# Patient Record
Sex: Male | Born: 1950
Health system: Southern US, Community
[De-identification: ages and names within clinical notes are randomized; demographics above are authoritative.]

## PROBLEM LIST (undated history)

## (undated) DIAGNOSIS — I10 Essential (primary) hypertension: Secondary | ICD-10-CM

## (undated) DIAGNOSIS — E785 Hyperlipidemia, unspecified: Secondary | ICD-10-CM

## (undated) DIAGNOSIS — R972 Elevated prostate specific antigen [PSA]: Secondary | ICD-10-CM

## (undated) DIAGNOSIS — I712 Thoracic aortic aneurysm, without rupture, unspecified: Secondary | ICD-10-CM

## (undated) DIAGNOSIS — T7840XA Allergy, unspecified, initial encounter: Secondary | ICD-10-CM

## (undated) DIAGNOSIS — K5909 Other constipation: Secondary | ICD-10-CM

## (undated) DIAGNOSIS — E0590 Thyrotoxicosis, unspecified without thyrotoxic crisis or storm: Secondary | ICD-10-CM

## (undated) DIAGNOSIS — H269 Unspecified cataract: Secondary | ICD-10-CM

## (undated) DIAGNOSIS — E079 Disorder of thyroid, unspecified: Secondary | ICD-10-CM

## (undated) DIAGNOSIS — G4730 Sleep apnea, unspecified: Secondary | ICD-10-CM

## (undated) DIAGNOSIS — E669 Obesity, unspecified: Secondary | ICD-10-CM

## (undated) DIAGNOSIS — K219 Gastro-esophageal reflux disease without esophagitis: Secondary | ICD-10-CM

## (undated) DIAGNOSIS — M109 Gout, unspecified: Secondary | ICD-10-CM

## (undated) HISTORY — PX: HYDROCELE EXCISION / REPAIR: SUR1145

## (undated) HISTORY — DX: Gout, unspecified: M10.9

## (undated) HISTORY — DX: Sleep apnea, unspecified: G47.30

## (undated) HISTORY — PX: VASECTOMY: SHX75

## (undated) HISTORY — PX: APPENDECTOMY: SHX54

## (undated) HISTORY — PX: EYE SURGERY: SHX253

## (undated) HISTORY — PX: TONSILLECTOMY: SUR1361

## (undated) HISTORY — DX: Thyrotoxicosis, unspecified without thyrotoxic crisis or storm: E05.90

## (undated) HISTORY — PX: THYROID SURGERY: SHX805

## (undated) HISTORY — DX: Allergy, unspecified, initial encounter: T78.40XA

## (undated) HISTORY — DX: Other constipation: K59.09

## (undated) HISTORY — DX: Obesity, unspecified: E66.9

## (undated) HISTORY — PX: COLONOSCOPY: SHX174

## (undated) HISTORY — DX: Hyperlipidemia, unspecified: E78.5

## (undated) HISTORY — DX: Thoracic aortic aneurysm, without rupture, unspecified: I71.20

## (undated) HISTORY — DX: Unspecified cataract: H26.9

## (undated) HISTORY — DX: Elevated prostate specific antigen (PSA): R97.20

## (undated) HISTORY — PX: CATARACT EXTRACTION, BILATERAL: SHX1313

## (undated) HISTORY — DX: Disorder of thyroid, unspecified: E07.9

## (undated) HISTORY — DX: Gastro-esophageal reflux disease without esophagitis: K21.9

## (undated) HISTORY — DX: Essential (primary) hypertension: I10

## (undated) HISTORY — PX: FRACTURE SURGERY: SHX138

---

## 2014-12-23 DIAGNOSIS — K429 Umbilical hernia without obstruction or gangrene: Secondary | ICD-10-CM | POA: Insufficient documentation

## 2016-05-17 DIAGNOSIS — Z6835 Body mass index (BMI) 35.0-35.9, adult: Secondary | ICD-10-CM | POA: Insufficient documentation

## 2016-05-17 DIAGNOSIS — I10 Essential (primary) hypertension: Secondary | ICD-10-CM | POA: Insufficient documentation

## 2016-05-17 DIAGNOSIS — Z6841 Body Mass Index (BMI) 40.0 and over, adult: Secondary | ICD-10-CM | POA: Insufficient documentation

## 2016-08-17 DIAGNOSIS — H35433 Paving stone degeneration of retina, bilateral: Secondary | ICD-10-CM | POA: Diagnosis not present

## 2016-08-17 DIAGNOSIS — H353131 Nonexudative age-related macular degeneration, bilateral, early dry stage: Secondary | ICD-10-CM | POA: Diagnosis not present

## 2016-08-17 DIAGNOSIS — D3132 Benign neoplasm of left choroid: Secondary | ICD-10-CM | POA: Diagnosis not present

## 2016-08-17 DIAGNOSIS — H43811 Vitreous degeneration, right eye: Secondary | ICD-10-CM | POA: Diagnosis not present

## 2016-12-12 DIAGNOSIS — R739 Hyperglycemia, unspecified: Secondary | ICD-10-CM | POA: Diagnosis not present

## 2016-12-12 DIAGNOSIS — E785 Hyperlipidemia, unspecified: Secondary | ICD-10-CM | POA: Diagnosis not present

## 2016-12-12 DIAGNOSIS — Z79899 Other long term (current) drug therapy: Secondary | ICD-10-CM | POA: Diagnosis not present

## 2016-12-12 DIAGNOSIS — Z6835 Body mass index (BMI) 35.0-35.9, adult: Secondary | ICD-10-CM | POA: Diagnosis not present

## 2016-12-12 DIAGNOSIS — M25561 Pain in right knee: Secondary | ICD-10-CM | POA: Diagnosis not present

## 2016-12-12 DIAGNOSIS — M109 Gout, unspecified: Secondary | ICD-10-CM | POA: Diagnosis not present

## 2016-12-12 DIAGNOSIS — E039 Hypothyroidism, unspecified: Secondary | ICD-10-CM | POA: Diagnosis not present

## 2016-12-12 DIAGNOSIS — I10 Essential (primary) hypertension: Secondary | ICD-10-CM | POA: Diagnosis not present

## 2017-01-19 DIAGNOSIS — R739 Hyperglycemia, unspecified: Secondary | ICD-10-CM | POA: Diagnosis not present

## 2017-01-19 DIAGNOSIS — E785 Hyperlipidemia, unspecified: Secondary | ICD-10-CM | POA: Diagnosis not present

## 2017-01-19 DIAGNOSIS — Z79899 Other long term (current) drug therapy: Secondary | ICD-10-CM | POA: Diagnosis not present

## 2017-01-19 DIAGNOSIS — I10 Essential (primary) hypertension: Secondary | ICD-10-CM | POA: Diagnosis not present

## 2017-01-19 DIAGNOSIS — M109 Gout, unspecified: Secondary | ICD-10-CM | POA: Diagnosis not present

## 2017-01-19 DIAGNOSIS — E039 Hypothyroidism, unspecified: Secondary | ICD-10-CM | POA: Diagnosis not present

## 2017-03-07 DIAGNOSIS — E785 Hyperlipidemia, unspecified: Secondary | ICD-10-CM | POA: Insufficient documentation

## 2017-03-07 DIAGNOSIS — K5909 Other constipation: Secondary | ICD-10-CM | POA: Insufficient documentation

## 2017-03-07 DIAGNOSIS — R635 Abnormal weight gain: Secondary | ICD-10-CM | POA: Diagnosis not present

## 2017-03-07 DIAGNOSIS — Z8739 Personal history of other diseases of the musculoskeletal system and connective tissue: Secondary | ICD-10-CM | POA: Diagnosis not present

## 2017-03-07 DIAGNOSIS — E039 Hypothyroidism, unspecified: Secondary | ICD-10-CM | POA: Insufficient documentation

## 2017-03-07 DIAGNOSIS — G4733 Obstructive sleep apnea (adult) (pediatric): Secondary | ICD-10-CM | POA: Insufficient documentation

## 2017-03-07 DIAGNOSIS — R948 Abnormal results of function studies of other organs and systems: Secondary | ICD-10-CM | POA: Diagnosis not present

## 2017-03-07 DIAGNOSIS — E669 Obesity, unspecified: Secondary | ICD-10-CM | POA: Diagnosis not present

## 2017-03-07 DIAGNOSIS — Z6838 Body mass index (BMI) 38.0-38.9, adult: Secondary | ICD-10-CM | POA: Diagnosis not present

## 2017-03-07 DIAGNOSIS — I10 Essential (primary) hypertension: Secondary | ICD-10-CM | POA: Diagnosis not present

## 2017-03-21 DIAGNOSIS — E785 Hyperlipidemia, unspecified: Secondary | ICD-10-CM | POA: Diagnosis not present

## 2017-03-21 DIAGNOSIS — Z8739 Personal history of other diseases of the musculoskeletal system and connective tissue: Secondary | ICD-10-CM | POA: Diagnosis not present

## 2017-03-21 DIAGNOSIS — I10 Essential (primary) hypertension: Secondary | ICD-10-CM | POA: Diagnosis not present

## 2017-03-21 DIAGNOSIS — E669 Obesity, unspecified: Secondary | ICD-10-CM | POA: Diagnosis not present

## 2017-03-21 DIAGNOSIS — Z6837 Body mass index (BMI) 37.0-37.9, adult: Secondary | ICD-10-CM | POA: Diagnosis not present

## 2017-03-30 DIAGNOSIS — Z713 Dietary counseling and surveillance: Secondary | ICD-10-CM | POA: Diagnosis not present

## 2017-03-30 DIAGNOSIS — E669 Obesity, unspecified: Secondary | ICD-10-CM | POA: Diagnosis not present

## 2017-03-30 DIAGNOSIS — Z6836 Body mass index (BMI) 36.0-36.9, adult: Secondary | ICD-10-CM | POA: Diagnosis not present

## 2017-04-17 DIAGNOSIS — Z713 Dietary counseling and surveillance: Secondary | ICD-10-CM | POA: Diagnosis not present

## 2017-04-17 DIAGNOSIS — Z6836 Body mass index (BMI) 36.0-36.9, adult: Secondary | ICD-10-CM | POA: Diagnosis not present

## 2017-04-17 DIAGNOSIS — E669 Obesity, unspecified: Secondary | ICD-10-CM | POA: Diagnosis not present

## 2017-05-02 DIAGNOSIS — Z6836 Body mass index (BMI) 36.0-36.9, adult: Secondary | ICD-10-CM | POA: Diagnosis not present

## 2017-05-02 DIAGNOSIS — E785 Hyperlipidemia, unspecified: Secondary | ICD-10-CM | POA: Diagnosis not present

## 2017-05-02 DIAGNOSIS — I10 Essential (primary) hypertension: Secondary | ICD-10-CM | POA: Diagnosis not present

## 2017-05-02 DIAGNOSIS — G4733 Obstructive sleep apnea (adult) (pediatric): Secondary | ICD-10-CM | POA: Diagnosis not present

## 2017-05-08 DIAGNOSIS — L282 Other prurigo: Secondary | ICD-10-CM | POA: Diagnosis not present

## 2017-05-08 DIAGNOSIS — Z6835 Body mass index (BMI) 35.0-35.9, adult: Secondary | ICD-10-CM | POA: Diagnosis not present

## 2017-05-08 DIAGNOSIS — I10 Essential (primary) hypertension: Secondary | ICD-10-CM | POA: Diagnosis not present

## 2017-05-08 DIAGNOSIS — Z125 Encounter for screening for malignant neoplasm of prostate: Secondary | ICD-10-CM | POA: Diagnosis not present

## 2017-05-08 DIAGNOSIS — E785 Hyperlipidemia, unspecified: Secondary | ICD-10-CM | POA: Diagnosis not present

## 2017-05-08 DIAGNOSIS — R42 Dizziness and giddiness: Secondary | ICD-10-CM | POA: Diagnosis not present

## 2017-05-08 DIAGNOSIS — E039 Hypothyroidism, unspecified: Secondary | ICD-10-CM | POA: Diagnosis not present

## 2017-05-08 DIAGNOSIS — Z79899 Other long term (current) drug therapy: Secondary | ICD-10-CM | POA: Diagnosis not present

## 2017-05-25 DIAGNOSIS — I10 Essential (primary) hypertension: Secondary | ICD-10-CM | POA: Diagnosis not present

## 2017-05-25 DIAGNOSIS — Z125 Encounter for screening for malignant neoplasm of prostate: Secondary | ICD-10-CM | POA: Diagnosis not present

## 2017-05-25 DIAGNOSIS — E039 Hypothyroidism, unspecified: Secondary | ICD-10-CM | POA: Diagnosis not present

## 2017-05-25 DIAGNOSIS — R42 Dizziness and giddiness: Secondary | ICD-10-CM | POA: Diagnosis not present

## 2017-05-25 DIAGNOSIS — Z79899 Other long term (current) drug therapy: Secondary | ICD-10-CM | POA: Diagnosis not present

## 2017-05-25 DIAGNOSIS — Z6835 Body mass index (BMI) 35.0-35.9, adult: Secondary | ICD-10-CM | POA: Diagnosis not present

## 2017-05-25 DIAGNOSIS — E785 Hyperlipidemia, unspecified: Secondary | ICD-10-CM | POA: Diagnosis not present

## 2017-05-31 DIAGNOSIS — I10 Essential (primary) hypertension: Secondary | ICD-10-CM | POA: Diagnosis not present

## 2017-05-31 DIAGNOSIS — Z0000 Encounter for general adult medical examination without abnormal findings: Secondary | ICD-10-CM | POA: Diagnosis not present

## 2017-05-31 DIAGNOSIS — Z23 Encounter for immunization: Secondary | ICD-10-CM | POA: Diagnosis not present

## 2017-06-01 DIAGNOSIS — M9903 Segmental and somatic dysfunction of lumbar region: Secondary | ICD-10-CM | POA: Diagnosis not present

## 2017-06-01 DIAGNOSIS — M5134 Other intervertebral disc degeneration, thoracic region: Secondary | ICD-10-CM | POA: Diagnosis not present

## 2017-06-01 DIAGNOSIS — M5137 Other intervertebral disc degeneration, lumbosacral region: Secondary | ICD-10-CM | POA: Diagnosis not present

## 2017-06-01 DIAGNOSIS — M5136 Other intervertebral disc degeneration, lumbar region: Secondary | ICD-10-CM | POA: Diagnosis not present

## 2017-06-01 DIAGNOSIS — M9902 Segmental and somatic dysfunction of thoracic region: Secondary | ICD-10-CM | POA: Diagnosis not present

## 2017-06-01 DIAGNOSIS — M5135 Other intervertebral disc degeneration, thoracolumbar region: Secondary | ICD-10-CM | POA: Diagnosis not present

## 2017-06-02 DIAGNOSIS — M9903 Segmental and somatic dysfunction of lumbar region: Secondary | ICD-10-CM | POA: Diagnosis not present

## 2017-06-02 DIAGNOSIS — M5135 Other intervertebral disc degeneration, thoracolumbar region: Secondary | ICD-10-CM | POA: Diagnosis not present

## 2017-06-02 DIAGNOSIS — M9902 Segmental and somatic dysfunction of thoracic region: Secondary | ICD-10-CM | POA: Diagnosis not present

## 2017-06-02 DIAGNOSIS — M5136 Other intervertebral disc degeneration, lumbar region: Secondary | ICD-10-CM | POA: Diagnosis not present

## 2017-06-02 DIAGNOSIS — M5134 Other intervertebral disc degeneration, thoracic region: Secondary | ICD-10-CM | POA: Diagnosis not present

## 2017-06-02 DIAGNOSIS — M5137 Other intervertebral disc degeneration, lumbosacral region: Secondary | ICD-10-CM | POA: Diagnosis not present

## 2017-06-05 DIAGNOSIS — M9902 Segmental and somatic dysfunction of thoracic region: Secondary | ICD-10-CM | POA: Diagnosis not present

## 2017-06-05 DIAGNOSIS — M5135 Other intervertebral disc degeneration, thoracolumbar region: Secondary | ICD-10-CM | POA: Diagnosis not present

## 2017-06-05 DIAGNOSIS — M5137 Other intervertebral disc degeneration, lumbosacral region: Secondary | ICD-10-CM | POA: Diagnosis not present

## 2017-06-05 DIAGNOSIS — M9903 Segmental and somatic dysfunction of lumbar region: Secondary | ICD-10-CM | POA: Diagnosis not present

## 2017-06-05 DIAGNOSIS — M5136 Other intervertebral disc degeneration, lumbar region: Secondary | ICD-10-CM | POA: Diagnosis not present

## 2017-06-05 DIAGNOSIS — M5134 Other intervertebral disc degeneration, thoracic region: Secondary | ICD-10-CM | POA: Diagnosis not present

## 2017-06-07 DIAGNOSIS — M5135 Other intervertebral disc degeneration, thoracolumbar region: Secondary | ICD-10-CM | POA: Diagnosis not present

## 2017-06-07 DIAGNOSIS — M5134 Other intervertebral disc degeneration, thoracic region: Secondary | ICD-10-CM | POA: Diagnosis not present

## 2017-06-07 DIAGNOSIS — M9902 Segmental and somatic dysfunction of thoracic region: Secondary | ICD-10-CM | POA: Diagnosis not present

## 2017-06-07 DIAGNOSIS — M5137 Other intervertebral disc degeneration, lumbosacral region: Secondary | ICD-10-CM | POA: Diagnosis not present

## 2017-06-07 DIAGNOSIS — M9903 Segmental and somatic dysfunction of lumbar region: Secondary | ICD-10-CM | POA: Diagnosis not present

## 2017-06-07 DIAGNOSIS — M5136 Other intervertebral disc degeneration, lumbar region: Secondary | ICD-10-CM | POA: Diagnosis not present

## 2017-06-13 DIAGNOSIS — Z6835 Body mass index (BMI) 35.0-35.9, adult: Secondary | ICD-10-CM | POA: Diagnosis not present

## 2017-06-13 DIAGNOSIS — E669 Obesity, unspecified: Secondary | ICD-10-CM | POA: Diagnosis not present

## 2017-06-13 DIAGNOSIS — Z713 Dietary counseling and surveillance: Secondary | ICD-10-CM | POA: Diagnosis not present

## 2017-06-27 DIAGNOSIS — G4733 Obstructive sleep apnea (adult) (pediatric): Secondary | ICD-10-CM | POA: Diagnosis not present

## 2017-06-27 DIAGNOSIS — E669 Obesity, unspecified: Secondary | ICD-10-CM | POA: Diagnosis not present

## 2017-06-27 DIAGNOSIS — Z6835 Body mass index (BMI) 35.0-35.9, adult: Secondary | ICD-10-CM | POA: Diagnosis not present

## 2017-06-27 DIAGNOSIS — E785 Hyperlipidemia, unspecified: Secondary | ICD-10-CM | POA: Diagnosis not present

## 2017-08-21 DIAGNOSIS — Z6836 Body mass index (BMI) 36.0-36.9, adult: Secondary | ICD-10-CM | POA: Diagnosis not present

## 2017-08-21 DIAGNOSIS — E039 Hypothyroidism, unspecified: Secondary | ICD-10-CM | POA: Diagnosis not present

## 2017-08-21 DIAGNOSIS — I10 Essential (primary) hypertension: Secondary | ICD-10-CM | POA: Diagnosis not present

## 2017-08-21 DIAGNOSIS — E785 Hyperlipidemia, unspecified: Secondary | ICD-10-CM | POA: Diagnosis not present

## 2017-08-21 DIAGNOSIS — G4733 Obstructive sleep apnea (adult) (pediatric): Secondary | ICD-10-CM | POA: Diagnosis not present

## 2017-08-21 DIAGNOSIS — E669 Obesity, unspecified: Secondary | ICD-10-CM | POA: Diagnosis not present

## 2017-10-30 DIAGNOSIS — E785 Hyperlipidemia, unspecified: Secondary | ICD-10-CM | POA: Diagnosis not present

## 2017-10-30 DIAGNOSIS — Z6836 Body mass index (BMI) 36.0-36.9, adult: Secondary | ICD-10-CM | POA: Diagnosis not present

## 2017-10-30 DIAGNOSIS — E669 Obesity, unspecified: Secondary | ICD-10-CM | POA: Diagnosis not present

## 2017-10-30 DIAGNOSIS — E038 Other specified hypothyroidism: Secondary | ICD-10-CM | POA: Diagnosis not present

## 2017-10-30 DIAGNOSIS — I10 Essential (primary) hypertension: Secondary | ICD-10-CM | POA: Diagnosis not present

## 2017-10-30 DIAGNOSIS — G4733 Obstructive sleep apnea (adult) (pediatric): Secondary | ICD-10-CM | POA: Diagnosis not present

## 2018-01-18 ENCOUNTER — Ambulatory Visit (INDEPENDENT_AMBULATORY_CARE_PROVIDER_SITE_OTHER): Payer: Medicare Other | Admitting: Medical

## 2018-01-18 ENCOUNTER — Encounter: Payer: Self-pay | Admitting: Medical

## 2018-01-18 VITALS — BP 139/90 | HR 82 | Temp 98.0°F | Resp 16 | Ht 73.0 in | Wt 279.0 lb

## 2018-01-18 DIAGNOSIS — M109 Gout, unspecified: Secondary | ICD-10-CM

## 2018-01-18 DIAGNOSIS — E785 Hyperlipidemia, unspecified: Secondary | ICD-10-CM

## 2018-01-18 DIAGNOSIS — I10 Essential (primary) hypertension: Secondary | ICD-10-CM | POA: Diagnosis not present

## 2018-01-18 DIAGNOSIS — E039 Hypothyroidism, unspecified: Secondary | ICD-10-CM | POA: Diagnosis not present

## 2018-01-18 DIAGNOSIS — R972 Elevated prostate specific antigen [PSA]: Secondary | ICD-10-CM | POA: Insufficient documentation

## 2018-01-18 MED ORDER — LOSARTAN POTASSIUM 25 MG PO TABS: 25.0000 mg | ORAL_TABLET | Freq: Every day | ORAL | 1 refills | Status: DC

## 2018-01-18 MED ORDER — SIMVASTATIN 20 MG PO TABS: 20.0000 mg | ORAL_TABLET | Freq: Every day | ORAL | 0 refills | Status: DC

## 2018-01-18 NOTE — Patient Instructions (Addendum)
  Your blood pressure is moderately well controlled today.  Blood pressures are better at home and when you lost weight recently.  We will continue current BP med regimen and refill prescription today.  For your high cholesterol, also refilled prescription.  I am putting in future labs to be done within the next month.  You could schedule those as you leave today or you could call back.  Lab does want you to be prescheduled a day or 2 in advance   Recommend healthy diet and exercise as tolerated.   If your right knee pain worsens or changes please let me know and could order x-ray.  Follow-up date to be determined after lab review.

## 2018-01-18 NOTE — Progress Notes (Signed)
Subjective:    Patient ID: Jackson French, male    DOB: [birth year], 67 y.o.   MRN: [id number]  HPI   Pt in for first time.   He has family in [place]. 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 He worked in [employer] and [place] [place]. Was in [employer] for 6 years before he went into private practice.  He feels good today. No acute complaints.  He has hx of gout, htn and high cholesterol.  He get rare gout flare. He has colchicine for acute attacks. He states rare flare. Last flare rt knee one year ago.  Pt bp is 139/90 presently. At home his bp is 135/80 usually approximate.  Pt has hx of high cholesterol. He is on zocor.   Also has hypothyroid. On thyroid supplmentation  Recent weight max was 290 lb.   No acute symptoms presently.   Review of Systems  Constitutional: Negative for chills, fatigue and fever.  HENT: Negative for congestion, dental problem, ear discharge and ear pain.   Respiratory: Negative for cough, chest tightness, shortness of breath and wheezing.   Cardiovascular: Negative for chest pain and palpitations.  Gastrointestinal: Negative for abdominal pain.  Musculoskeletal: Negative for back pain, gait problem, neck pain and neck stiffness.       Occasional rt knee pain.  Skin: Negative for rash.  Neurological: Negative for dizziness, speech difficulty, weakness, light-headedness, numbness and headaches.  Hematological: Negative for adenopathy. Does not bruise/bleed easily.  Psychiatric/Behavioral: Negative for behavioral problems, confusion, self-injury and sleep disturbance. The patient is not nervous/anxious.     Past Medical History:  Diagnosis Date  . Hyperlipidemia   . Hypertension      Social History   Socioeconomic History  . Marital status: Married    Spouse name: Not on file  . Number of children: Not on file  . Years of education: Not on file  . Highest education level: Not on file  Occupational History  . Not on file    Social Needs  . Financial resource strain: Not on file  . Food insecurity:    Worry: Not on file    Inability: Not on file  . Transportation needs:    Medical: Not on file    Non-medical: Not on file  Tobacco Use  . Smoking status: Never Smoker  . Smokeless tobacco: Never Used  Substance and Sexual Activity  . Alcohol use: Yes  . Drug use: Not on file  . Sexual activity: Not on file  Lifestyle  . Physical activity:    Days per week: Not on file    Minutes per session: Not on file  . Stress: Not on file  Relationships  . Social connections:    Talks on phone: Not on file    Gets together: Not on file    Attends religious service: Not on file    Active member of club or organization: Not on file    Attends meetings of clubs or organizations: Not on file    Relationship status: Not on file  . Intimate partner violence:    Fear of current or ex partner: Not on file    Emotionally abused: Not on file    Physically abused: Not on file    Forced sexual activity: Not on file  Other Topics Concern  . Not on file  Social History Narrative  . Not on file      Family History  Problem Relation Age of Onset  .  Arthritis Mother   . Breast cancer Mother     Not on File  Current Outpatient Medications on File Prior to Visit  Medication Sig Dispense Refill  . aspirin 81 MG EC tablet Take 20 mg by mouth daily.    . colchicine 0.6 MG tablet Take 0.6 mg by mouth daily as needed.    Marland Kitchen levothyroxine (SYNTHROID, LEVOTHROID) 112 MCG tablet Take 112 mcg by mouth daily.    Marland Kitchen losartan (COZAAR) 25 MG tablet Take 25 mg by mouth daily.    . simvastatin (ZOCOR) 20 MG tablet Take 20 mg by mouth daily.     No current facility-administered medications on file prior to visit.     BP 139/90   Pulse 82   Temp 98 F (36.7 C) (Oral)   Resp 16   Ht 6\' 1"  (1.854 m)   Wt 279 lb (126.6 kg)   SpO2 98%   BMI 36.81 kg/m        Objective:   Physical Exam   General Mental Status-  Alert. General Appearance- Not in acute distress.   Skin General: Color- Normal Color. Moisture- Normal Moisture.  Neck Carotid Arteries- Normal color. Moisture- Normal Moisture. No carotid bruits. No JVD.  Chest and Lung Exam Auscultation: Breath Sounds:-Normal.  Cardiovascular Auscultation:Rythm- Regular. Murmurs & Other Heart Sounds:Auscultation of the heart reveals- No Murmurs.  Abdomen Inspection:-Inspeection Normal. Palpation/Percussion:Note:No mass. Palpation and Percussion of the abdomen reveal- Non Tender, Non Distended + BS, no rebound or guarding.  Neurologic Cranial Nerve exam:- CN III-XII intact(No nystagmus), symmetric smile. Strength:- 5/5 equal and symmetric strength both upper and lower extremities.     Assessment & Plan:  Your blood pressure is moderately well controlled today.  Blood pressures are better at home and when he lost weight recently.  We will continue current BP med regimen and refill prescription today.  For your high cholesterol, also refill prescription.  I am putting in future labs to be done within the next month.  You could schedule those as you leave today or you could call back.  Lab does want you to be prescheduled a day or 2 in advance.   Recommend healthy diet and exercise as tolerated.   If your right knee pain worsens or changes please let me know and could order x-ray.  Follow-up date to be determined after lab review.  Mackie Pai, PA-C

## 2018-02-12 ENCOUNTER — Other Ambulatory Visit: Payer: Self-pay | Admitting: Medical

## 2018-02-15 ENCOUNTER — Other Ambulatory Visit (INDEPENDENT_AMBULATORY_CARE_PROVIDER_SITE_OTHER): Payer: Medicare Other

## 2018-02-15 DIAGNOSIS — I10 Essential (primary) hypertension: Secondary | ICD-10-CM | POA: Diagnosis not present

## 2018-02-15 DIAGNOSIS — M109 Gout, unspecified: Secondary | ICD-10-CM | POA: Diagnosis not present

## 2018-02-15 DIAGNOSIS — E039 Hypothyroidism, unspecified: Secondary | ICD-10-CM

## 2018-02-15 DIAGNOSIS — E785 Hyperlipidemia, unspecified: Secondary | ICD-10-CM

## 2018-02-15 LAB — COMPREHENSIVE METABOLIC PANEL
ALT: 20 U/L (ref 0–53)
AST: 18 U/L (ref 0–37)
Albumin: 4.2 g/dL (ref 3.5–5.2)
Alkaline Phosphatase: 59 U/L (ref 39–117)
BUN: 17 mg/dL (ref 6–23)
CO2: 25 mEq/L (ref 19–32)
Calcium: 9.1 mg/dL (ref 8.4–10.5)
Chloride: 107 mEq/L (ref 96–112)
Creatinine, Ser: 0.92 mg/dL (ref 0.40–1.50)
GFR: 87.14 mL/min (ref >60.00)
Glucose, Bld: 116 mg/dL — ABNORMAL HIGH (ref 70–99)
Potassium: 4 mEq/L (ref 3.5–5.1)
Sodium: 140 mEq/L (ref 135–145)
Total Bilirubin: 0.6 mg/dL (ref 0.2–1.2)
Total Protein: 6.2 g/dL (ref 6.0–8.3)

## 2018-02-15 LAB — CBC WITH DIFFERENTIAL/PLATELET
BASOS ABS: 0 10*3/uL (ref 0.0–0.1)
Basophils Relative: 0.7 % (ref 0.0–3.0)
EOS PCT: 8.3 % — AB (ref 0.0–5.0)
Eosinophils Absolute: 0.4 10*3/uL (ref 0.0–0.7)
HEMATOCRIT: 43.6 % (ref 39.0–52.0)
Hemoglobin: 15.1 g/dL (ref 13.0–17.0)
LYMPHS ABS: 1.6 10*3/uL (ref 0.7–4.0)
LYMPHS PCT: 29.4 % (ref 12.0–46.0)
MCHC: 34.7 g/dL (ref 30.0–36.0)
MCV: 91.1 fl (ref 78.0–100.0)
MONOS PCT: 10.2 % (ref 3.0–12.0)
Monocytes Absolute: 0.5 10*3/uL (ref 0.1–1.0)
Neutro Abs: 2.7 10*3/uL (ref 1.4–7.7)
Neutrophils Relative %: 51.4 % (ref 43.0–77.0)
Platelets: 224 10*3/uL (ref 150.0–400.0)
RBC: 4.79 Mil/uL (ref 4.22–5.81)
RDW: 12.9 % (ref 11.5–15.5)
WBC: 5.3 10*3/uL (ref 4.0–10.5)

## 2018-02-15 LAB — TSH: TSH: 3.45 u[IU]/mL (ref 0.35–4.50)

## 2018-02-15 LAB — LIPID PANEL
Cholesterol: 132 mg/dL (ref 0–200)
HDL: 34.9 mg/dL — ABNORMAL LOW (ref >39.00)
LDL Cholesterol: 74 mg/dL (ref 0–99)
NonHDL: 97.05
Total CHOL/HDL Ratio: 4
Triglycerides: 115 mg/dL (ref 0.0–149.0)
VLDL: 23 mg/dL (ref 0.0–40.0)

## 2018-02-15 LAB — URIC ACID: Uric Acid, Serum: 8 mg/dL — ABNORMAL HIGH (ref 4.0–7.8)

## 2018-02-19 ENCOUNTER — Telehealth: Payer: Self-pay | Admitting: *Deleted

## 2018-02-19 ENCOUNTER — Other Ambulatory Visit (INDEPENDENT_AMBULATORY_CARE_PROVIDER_SITE_OTHER): Payer: Medicare Other

## 2018-02-19 DIAGNOSIS — R739 Hyperglycemia, unspecified: Secondary | ICD-10-CM | POA: Diagnosis not present

## 2018-02-19 LAB — HEMOGLOBIN A1C: HEMOGLOBIN A1C: 5.5 % (ref 4.6–6.5)

## 2018-02-19 NOTE — Telephone Encounter (Signed)
Add-on for HgbA1c was faxed on (02/16/18) and (02/19/18).  Confirmation was received.//AB/CMA

## 2018-02-19 NOTE — Telephone Encounter (Signed)
-----   Message from Mackie Pai, PA-C sent at 02/15/2018  9:55 PM EDT ----- Can a1c be added to labs drawn today. Dx would be elevated sugar.  Thanks,

## 2018-03-13 ENCOUNTER — Other Ambulatory Visit: Payer: Self-pay | Admitting: Medical

## 2018-03-18 ENCOUNTER — Other Ambulatory Visit: Payer: Self-pay | Admitting: Medical

## 2018-05-13 ENCOUNTER — Other Ambulatory Visit: Payer: Self-pay | Admitting: Medical

## 2018-05-16 ENCOUNTER — Encounter: Payer: Self-pay | Admitting: Medical

## 2018-05-16 ENCOUNTER — Ambulatory Visit (INDEPENDENT_AMBULATORY_CARE_PROVIDER_SITE_OTHER): Payer: Medicare Other | Admitting: Medical

## 2018-05-16 VITALS — BP 133/87 | HR 61 | Temp 98.0°F | Resp 16 | Ht 73.0 in | Wt 280.6 lb

## 2018-05-16 DIAGNOSIS — E785 Hyperlipidemia, unspecified: Secondary | ICD-10-CM | POA: Diagnosis not present

## 2018-05-16 DIAGNOSIS — Z23 Encounter for immunization: Secondary | ICD-10-CM | POA: Diagnosis not present

## 2018-05-16 DIAGNOSIS — M25561 Pain in right knee: Secondary | ICD-10-CM | POA: Diagnosis not present

## 2018-05-16 DIAGNOSIS — I10 Essential (primary) hypertension: Secondary | ICD-10-CM

## 2018-05-16 MED ORDER — SIMVASTATIN 20 MG PO TABS: 20.0000 mg | ORAL_TABLET | Freq: Every day | ORAL | 3 refills | Status: DC

## 2018-05-16 NOTE — Progress Notes (Signed)
Subjective:    Patient ID: Jackson French, male    DOB: 23-Jul-1951, 67 y.o.   MRN: [id number]  HPI  Pt in for follow up.   Pt blood pressure is controlled today. He is on losartan. No side effects.   Pt lipid panel is well controlled. Only mild hdl elevation. Pt is on zocor. No side effects.  Pt states his rt knee is still hurting on and off. He states feels better if more active. Pt states no med required. Still declined referral.  Pt  a1c done last time not in diabetic range.  He wants flu vaccine today.  Hx of colonoscopy in past. They were negative per pt. Pt wants to wait until spring until done.   Pt not taking colchicine preventative manner. Only for flares and no recent flares per his report.   Review of Systems  Constitutional: Negative for chills, fatigue and fever.  Respiratory: Negative for cough, chest tightness, shortness of breath and wheezing.   Cardiovascular: Negative for chest pain and palpitations.  Gastrointestinal: Negative for abdominal distention, anal bleeding, blood in stool and diarrhea.  Genitourinary: Negative for dysuria, flank pain, frequency and testicular pain.  Musculoskeletal: Negative for back pain and myalgias.  Skin: Negative for rash.  Neurological: Negative for dizziness and headaches.  Hematological: Negative for adenopathy. Does not bruise/bleed easily.  Psychiatric/Behavioral: Negative for behavioral problems and confusion. The patient is not nervous/anxious.    Past Medical History:  Diagnosis Date  . Gout   . Hyperlipidemia   . Hypertension   . Thyroid disease      Social History   Socioeconomic History  . Marital status: Married    Spouse name: Not on file  . Number of children: Not on file  . Years of education: Not on file  . Highest education level: Not on file  Occupational History  . Not on file  Social Needs  . Financial resource strain: Not on file  . Food insecurity:    Worry: Not on file    Inability:  Not on file  . Transportation needs:    Medical: Not on file    Non-medical: Not on file  Tobacco Use  . Smoking status: Never Smoker  . Smokeless tobacco: Never Used  Substance and Sexual Activity  . Alcohol use: Yes  . Drug use: Never  . Sexual activity: Yes  Lifestyle  . Physical activity:    Days per week: Not on file    Minutes per session: Not on file  . Stress: Not on file  Relationships  . Social connections:    Talks on phone: Not on file    Gets together: Not on file    Attends religious service: Not on file    Active member of club or organization: Not on file    Attends meetings of clubs or organizations: Not on file    Relationship status: Not on file  . Intimate partner violence:    Fear of current or ex partner: Not on file    Emotionally abused: Not on file    Physically abused: Not on file    Forced sexual activity: Not on file  Other Topics Concern  . Not on file  Social History Narrative  . Not on file      Family History  Problem Relation Age of Onset  . Arthritis Mother   . Breast cancer Mother     Not on File  Current Outpatient Medications on File Prior to  Visit  Medication Sig Dispense Refill  . colchicine 0.6 MG tablet Take 0.6 mg by mouth daily as needed.    Marland Kitchen levothyroxine (SYNTHROID, LEVOTHROID) 112 MCG tablet Take 112 mcg by mouth daily.    Marland Kitchen losartan (COZAAR) 25 MG tablet Take 1 tablet (25 mg total) by mouth daily. 90 tablet 1  . simvastatin (ZOCOR) 20 MG tablet TAKE 1 TABLET BY MOUTH EVERY DAY 30 tablet 0   No current facility-administered medications on file prior to visit.     BP 133/87   Pulse 61   Temp 98 F (36.7 C) (Oral)   Resp 16   Ht 6\' 1"  (1.854 m)   Wt 280 lb 9.6 oz (127.3 kg)   SpO2 100%   BMI 37.02 kg/m       Objective:   Physical Exam  General Mental Status- Alert. General Appearance- Not in acute distress.   Skin General: Color- Normal Color. Moisture- Normal Moisture.  Neck Carotid Arteries-  Normal color. Moisture- Normal Moisture. No carotid bruits. No JVD.  Chest and Lung Exam Auscultation: Breath Sounds:-Normal.  Cardiovascular Auscultation:Rythm- Regular. Murmurs & Other Heart Sounds:Auscultation of the heart reveals- No Murmurs.  Abdomen Inspection:-Inspeection Normal. Palpation/Percussion:Note:No mass. Palpation and Percussion of the abdomen reveal- Non Tender, Non Distended + BS, no rebound or guarding.   Neurologic Cranial Nerve exam:- CN III-XII intact(No nystagmus), symmetric smile. Strength:- 5/5 equal and symmetric strength both upper and lower extremities.  Rt knee- no obvious pain on palpation presently.No crepitus.      Assessment & Plan:  Your bp is adequately controlled today. Recommend continue with losartan.  For high cholesterol, lipids well controlled in summer. Refilled today at 90 tabs.  For knee pain, if worsens severe then can get uric acid. If want xray or sport med referral let me know.  Can get you scheduled in spring for colonoscopy.(plan to place referral in spring for early summer colonoscopy. Pt wants to wait.)  Follow up April or as needed  [employer], PA-C

## 2018-05-16 NOTE — Patient Instructions (Addendum)
Your bp is adequately controlled today. Recommend continue with losartan.  For high cholesterol, lipids well controlled in summer. Refilled today at 90 tabs.  For knee pain, if worsens severe then can get uric acid. If want xray or sport med referral let me know.  Can get you scheduled in spring for colonoscopy.  Follow up April or as needed

## 2018-07-17 ENCOUNTER — Encounter: Payer: Self-pay | Admitting: Medical

## 2018-07-18 ENCOUNTER — Ambulatory Visit (INDEPENDENT_AMBULATORY_CARE_PROVIDER_SITE_OTHER): Payer: Medicare Other | Admitting: Medical

## 2018-07-18 ENCOUNTER — Encounter: Payer: Self-pay | Admitting: Medical

## 2018-07-18 VITALS — BP 136/86 | HR 69 | Temp 97.9°F | Resp 16 | Ht 73.0 in | Wt 284.8 lb

## 2018-07-18 DIAGNOSIS — R0981 Nasal congestion: Secondary | ICD-10-CM

## 2018-07-18 DIAGNOSIS — R059 Cough, unspecified: Secondary | ICD-10-CM

## 2018-07-18 DIAGNOSIS — J069 Acute upper respiratory infection, unspecified: Secondary | ICD-10-CM

## 2018-07-18 DIAGNOSIS — R350 Frequency of micturition: Secondary | ICD-10-CM | POA: Diagnosis not present

## 2018-07-18 DIAGNOSIS — R05 Cough: Secondary | ICD-10-CM

## 2018-07-18 MED ORDER — AZITHROMYCIN 250 MG PO TABS: ORAL_TABLET | ORAL | 0 refills | Status: DC

## 2018-07-18 MED ORDER — BENZONATATE 100 MG PO CAPS: 100.0000 mg | ORAL_CAPSULE | Freq: Three times a day (TID) | ORAL | 0 refills | Status: DC | PRN

## 2018-07-18 MED ORDER — FLUTICASONE PROPIONATE 50 MCG/ACT NA SUSP: 2.0000 | Freq: Every day | NASAL | 1 refills | Status: DC

## 2018-07-18 MED ORDER — LEVOTHYROXINE SODIUM 112 MCG PO TABS: 112.0000 ug | ORAL_TABLET | Freq: Every day | ORAL | 5 refills | Status: DC

## 2018-07-18 NOTE — Patient Instructions (Signed)
You may have viral uri vs allergy flare post blowing leaves.  Recommend flonase nasal spray for nasal congestion and benzonatate for cough.  If signs and symptoms worsen like sinus infection or bronchitis then start azithromycin. Print rx.  For frequent urination will get psa. If elevated will refer to urologist.  Check testicles on occasion. If any lumps would get [place].  Follow up 7-10 days or as needed

## 2018-07-18 NOTE — Progress Notes (Signed)
Subjective:    Patient ID: Jackson French, male    DOB: [birth year], 67 y.o.   MRN: [id number]  HPI  Pt in for nasal congestion, chest congestion and some productive cough. This has been going on for about 5-6 days  Pt state these symptoms seem to come on blowing  a lot leaves on weekend. Then the  next day he felt fatigue with low grade fever.  Pt was not reporting any obvious sneezing.  Hx of fluctuating psa in the past.   He expresses maybe he only has virus or maybe allergies.    Review of Systems  Constitutional: Negative for chills, fatigue and fever.  HENT: Positive for congestion and postnasal drip. Negative for mouth sores, nosebleeds, sinus pressure and sinus pain.   Respiratory: Positive for cough. Negative for chest tightness, shortness of breath and wheezing.   Cardiovascular: Negative for chest pain and palpitations.  Gastrointestinal: Negative for abdominal pain.  Genitourinary: Positive for frequency.       Updates me that his son was diagnosed with testicular cancer.  Slight fequent urination.  Musculoskeletal: Negative for back pain.  Neurological: Negative for seizures, syncope, facial asymmetry, weakness and numbness.  Hematological: Negative for adenopathy. Does not bruise/bleed easily.  Psychiatric/Behavioral: Negative for behavioral problems and decreased concentration.    Past Medical History:  Diagnosis Date  . Gout   . Hyperlipidemia   . Hypertension   . Thyroid disease      Social History   Socioeconomic History  . Marital status: Married    Spouse name: Not on file  . Number of children: Not on file  . Years of education: Not on file  . Highest education level: Not on file  Occupational History  . Not on file  Social Needs  . Financial resource strain: Not on file  . Food insecurity:    Worry: Not on file    Inability: Not on file  . Transportation needs:    Medical: Not on file    Non-medical: Not on file  Tobacco Use  .  Smoking status: Never Smoker  . Smokeless tobacco: Never Used  Substance and Sexual Activity  . Alcohol use: Yes  . Drug use: Never  . Sexual activity: Yes  Lifestyle  . Physical activity:    Days per week: Not on file    Minutes per session: Not on file  . Stress: Not on file  Relationships  . Social connections:    Talks on phone: Not on file    Gets together: Not on file    Attends religious service: Not on file    Active member of club or organization: Not on file    Attends meetings of clubs or organizations: Not on file    Relationship status: Not on file  . Intimate partner violence:    Fear of current or ex partner: Not on file    Emotionally abused: Not on file    Physically abused: Not on file    Forced sexual activity: Not on file  Other Topics Concern  . Not on file  Social History Narrative  . Not on file    Past Surgical History:  Procedure Laterality Date  . APPENDECTOMY    . HYDROCELE EXCISION / REPAIR    . THYROID SURGERY    . TONSILLECTOMY    . VASECTOMY      Family History  Problem Relation Age of Onset  . Arthritis Mother   . Breast cancer Mother  Not on File  Current Outpatient Medications on File Prior to Visit  Medication Sig Dispense Refill  . colchicine 0.6 MG tablet Take 0.6 mg by mouth daily as needed.    Marland Kitchen losartan (COZAAR) 25 MG tablet Take 1 tablet (25 mg total) by mouth daily. 90 tablet 1  . simvastatin (ZOCOR) 20 MG tablet Take 1 tablet (20 mg total) by mouth daily. 90 tablet 3   No current facility-administered medications on file prior to visit.     BP 136/86   Pulse 69   Temp 97.9 F (36.6 C) (Oral)   Resp 16   Ht 6\' 1"  (1.854 m)   Wt 284 lb 12.8 oz (129.2 kg)   SpO2 99%   BMI 37.57 kg/m       Objective:   Physical Exam  General  Mental Status - Alert. General Appearance - Well groomed. Not in acute distress.  Skin Rashes- No Rashes.  HEENT Head- Normal. Ear Auditory Canal - Left- Normal. Right -  Normal.Tympanic Membrane- Left- Normal. Right- Normal. Eye Sclera/Conjunctiva- Left- Normal. Right- Normal. Nose & Sinuses Nasal Mucosa- Left-  Boggy and Congested. Right-  Boggy and  Congested.Bilateral maxillary and frontal sinus pressure. Mouth & Throat Lips: Upper Lip- Normal: no dryness, cracking, pallor, cyanosis, or vesicular eruption. Lower Lip-Normal: no dryness, cracking, pallor, cyanosis or vesicular eruption. Buccal Mucosa- Bilateral- No Aphthous ulcers. Oropharynx- No Discharge or Erythema. Tonsils: Characteristics- Bilateral- No Erythema or Congestion. Size/Enlargement- Bilateral- No enlargement. Discharge- bilateral-None.  Neck Neck- Supple. No Masses.   Chest and Lung Exam Auscultation: Breath Sounds:-Clear even and unlabored.  Cardiovascular Auscultation:Rythm- Regular, rate and rhythm. Murmurs & Other Heart Sounds:Ausculatation of the heart reveal- No Murmurs.  Lymphatic Head & Neck General Head & Neck Lymphatics: Bilateral: Description- No Localized lymphadenopathy.  Genital exam- both testicles felt normal. No masses. normally testicles.       Assessment & Plan:  You may have viral uri vs allergy flare post blowing leaves.  Recommend flonase nasal spray for nasal congestion and benzonatate for cough.  If signs and symptoms worsen like sinus infection or bronchitis then start azithromycin. Print rx.  For frequent urination will get psa. If elevated will refer to urologist.  Check testicles on occasion. If any lumps would get [place].  Follow up 7-10 days or as needed  [employer], [employer]

## 2018-07-19 ENCOUNTER — Encounter: Payer: Self-pay | Admitting: Medical

## 2018-07-19 LAB — PSA: PSA: 4.34 ng/mL — ABNORMAL HIGH (ref 0.10–4.00)

## 2018-07-20 ENCOUNTER — Encounter: Payer: Self-pay | Admitting: Medical

## 2018-07-23 ENCOUNTER — Telehealth: Payer: Self-pay | Admitting: Medical

## 2018-07-24 ENCOUNTER — Telehealth: Payer: Self-pay | Admitting: Medical

## 2018-07-24 NOTE — Telephone Encounter (Signed)
Pt wants to see urologist Dr Jeffie Pollock or Dr. Gloriann Loan. I think both may be with [facility].

## 2018-07-25 ENCOUNTER — Telehealth: Payer: Self-pay | Admitting: *Deleted

## 2018-07-25 NOTE — Telephone Encounter (Signed)
Received Lab Report results from patient for PSA; forwarded to provider/SLS 12/18

## 2018-07-30 ENCOUNTER — Telehealth: Payer: Self-pay | Admitting: Medical

## 2018-07-30 NOTE — Telephone Encounter (Signed)
Reviewed psa values. Will you make copies and give to [facility] so she can send results with urologist referral. Then place in area to scan.

## 2018-07-30 NOTE — Telephone Encounter (Signed)
Pt is needing referral to Urologist, pt's records has been received with past PSA results

## 2018-07-31 ENCOUNTER — Telehealth: Payer: Self-pay | Admitting: Medical

## 2018-07-31 DIAGNOSIS — R972 Elevated prostate specific antigen [PSA]: Secondary | ICD-10-CM

## 2018-07-31 NOTE — Telephone Encounter (Signed)
Opened to refer. 

## 2018-07-31 NOTE — Telephone Encounter (Signed)
Pt wants to see Dr. Gloriann Loan or wrenn urologist.

## 2018-09-13 DIAGNOSIS — N400 Enlarged prostate without lower urinary tract symptoms: Secondary | ICD-10-CM | POA: Diagnosis not present

## 2018-09-13 DIAGNOSIS — R972 Elevated prostate specific antigen [PSA]: Secondary | ICD-10-CM | POA: Diagnosis not present

## 2018-10-03 ENCOUNTER — Other Ambulatory Visit: Payer: Self-pay | Admitting: Medical

## 2018-10-11 DIAGNOSIS — H43813 Vitreous degeneration, bilateral: Secondary | ICD-10-CM | POA: Diagnosis not present

## 2018-10-11 DIAGNOSIS — H35433 Paving stone degeneration of retina, bilateral: Secondary | ICD-10-CM | POA: Diagnosis not present

## 2018-10-11 DIAGNOSIS — H35363 Drusen (degenerative) of macula, bilateral: Secondary | ICD-10-CM | POA: Diagnosis not present

## 2018-10-11 DIAGNOSIS — H43393 Other vitreous opacities, bilateral: Secondary | ICD-10-CM | POA: Diagnosis not present

## 2018-10-19 ENCOUNTER — Encounter: Payer: Self-pay | Admitting: Medical

## 2018-10-19 MED ORDER — LEVOTHYROXINE SODIUM 112 MCG PO TABS: 112.0000 ug | ORAL_TABLET | Freq: Every day | ORAL | 1 refills | Status: DC

## 2018-10-19 MED ORDER — SIMVASTATIN 20 MG PO TABS: 20.0000 mg | ORAL_TABLET | Freq: Every day | ORAL | 1 refills | Status: DC

## 2018-10-19 MED ORDER — LOSARTAN POTASSIUM 25 MG PO TABS: 25.0000 mg | ORAL_TABLET | Freq: Every day | ORAL | 1 refills | Status: DC

## 2019-01-14 ENCOUNTER — Telehealth: Payer: Self-pay | Admitting: Medical

## 2019-01-14 NOTE — Telephone Encounter (Signed)
Copied from [place] [phone]. Topic: Quick Communication - Rx Refill/Question >> Jan 14, 2019 11:26 AM Oneta Rack wrote: Medication: colchicine 0.6 MG tablet (gout outbreak)     Preferred Pharmacy (with phone number or street name):  [id number] - [place], [place] - [address] AT [place] [phone] (Phone) [phone] (Fax)    Agent: Please be advised that RX refills may take up to 3 business days. We ask that you follow-up with your pharmacy.

## 2019-01-15 MED ORDER — COLCHICINE 0.6 MG PO TABS: 0.6000 mg | ORAL_TABLET | Freq: Two times a day (BID) | ORAL | 2 refills | Status: DC

## 2019-01-15 NOTE — Telephone Encounter (Signed)
Rx colchicine sent to pt pharmacy for his gout. If this not adequate then schedule for vitrual visit.

## 2019-01-16 ENCOUNTER — Encounter: Payer: Self-pay | Admitting: Medical

## 2019-01-16 ENCOUNTER — Other Ambulatory Visit: Payer: Self-pay

## 2019-01-16 ENCOUNTER — Ambulatory Visit (INDEPENDENT_AMBULATORY_CARE_PROVIDER_SITE_OTHER): Payer: Medicare Other | Admitting: Medical

## 2019-01-16 VITALS — BP 139/88 | HR 90 | Temp 98.1°F | Resp 16 | Ht 73.0 in | Wt 298.4 lb

## 2019-01-16 DIAGNOSIS — R739 Hyperglycemia, unspecified: Secondary | ICD-10-CM

## 2019-01-16 DIAGNOSIS — I10 Essential (primary) hypertension: Secondary | ICD-10-CM

## 2019-01-16 DIAGNOSIS — R972 Elevated prostate specific antigen [PSA]: Secondary | ICD-10-CM

## 2019-01-16 DIAGNOSIS — E039 Hypothyroidism, unspecified: Secondary | ICD-10-CM

## 2019-01-16 DIAGNOSIS — M109 Gout, unspecified: Secondary | ICD-10-CM

## 2019-01-16 DIAGNOSIS — E785 Hyperlipidemia, unspecified: Secondary | ICD-10-CM

## 2019-01-16 MED ORDER — LOSARTAN POTASSIUM 50 MG PO TABS: 50.0000 mg | ORAL_TABLET | Freq: Every day | ORAL | 1 refills | Status: DC

## 2019-01-16 MED ORDER — SIMVASTATIN 20 MG PO TABS: 20.0000 mg | ORAL_TABLET | Freq: Every day | ORAL | 1 refills | Status: DC

## 2019-01-16 NOTE — Patient Instructions (Addendum)
For bp a bit elevated/bordelrine increase losartan to 50 mg daily dose.  For gout hx and mild recent knee pain rt side I will rx colchicine.  For high cholersterol refill your zocor.  For elevated sugar hx, will get a1c along with other labs, cmp, lipid and uric acid.  Follow up 3-6 month or as needed.  Get flu vaccine in fall.

## 2019-01-16 NOTE — Progress Notes (Signed)
Subjective:    Patient ID: Jackson French, male    DOB: 1950-11-07, 68 y.o.   MRN: [id number]  HPI  Pt in for follow up on chronic med problems.  Htn, high cholesterol, low thyroid and gout.  BP mild high today on first check. . No side effects with meds.  Pt not fasting today.   He states he did have some mild rt knee pain about 2 days ago. He thinks maybe gout flare.   Pt did see urologist for elevated psa. He had repeat with urologist and psa was 2. He will follow up with urologist in December.    Review of Systems  Constitutional: Negative for activity change, chills, fatigue and fever.  Respiratory: Negative for cough, chest tightness and shortness of breath.   Cardiovascular: Negative for chest pain, palpitations and leg swelling.  Gastrointestinal: Negative for abdominal pain, nausea and vomiting.  Musculoskeletal: Negative for neck pain and neck stiffness.  Neurological: Negative for dizziness, speech difficulty, weakness, numbness and headaches.  Psychiatric/Behavioral: Negative for agitation, behavioral problems and confusion. The patient is not nervous/anxious.     Past Medical History:  Diagnosis Date  . Gout   . Hyperlipidemia   . Hypertension   . Thyroid disease      Social History   Socioeconomic History  . Marital status: Married    Spouse name: Not on file  . Number of children: Not on file  . Years of education: Not on file  . Highest education level: Not on file  Occupational History  . Not on file  Social Needs  . Financial resource strain: Not on file  . Food insecurity:    Worry: Not on file    Inability: Not on file  . Transportation needs:    Medical: Not on file    Non-medical: Not on file  Tobacco Use  . Smoking status: Never Smoker  . Smokeless tobacco: Never Used  Substance and Sexual Activity  . Alcohol use: Yes  . Drug use: Never  . Sexual activity: Yes  Lifestyle  . Physical activity:    Days per week: Not on file   Minutes per session: Not on file  . Stress: Not on file  Relationships  . Social connections:    Talks on phone: Not on file    Gets together: Not on file    Attends religious service: Not on file    Active member of club or organization: Not on file    Attends meetings of clubs or organizations: Not on file    Relationship status: Not on file  . Intimate partner violence:    Fear of current or ex partner: Not on file    Emotionally abused: Not on file    Physically abused: Not on file    Forced sexual activity: Not on file  Other Topics Concern  . Not on file  Social History Narrative  . Not on file    Past Surgical History:  Procedure Laterality Date  . APPENDECTOMY    . HYDROCELE EXCISION / REPAIR    . THYROID SURGERY    . TONSILLECTOMY    . VASECTOMY      Family History  Problem Relation Age of Onset  . Arthritis Mother   . Breast cancer Mother     Not on File  Current Outpatient Medications on File Prior to Visit  Medication Sig Dispense Refill  . azithromycin (ZITHROMAX) 250 MG tablet Take 2 tablets by mouth on day  1, followed by 1 tablet by mouth daily for 4 days. 6 tablet 0  . colchicine 0.6 MG tablet Take 1 tablet (0.6 mg total) by mouth 2 (two) times daily. 60 tablet 2  . levothyroxine (SYNTHROID, LEVOTHROID) 112 MCG tablet Take 1 tablet (112 mcg total) by mouth daily before breakfast. 90 tablet 1  . losartan (COZAAR) 25 MG tablet Take 1 tablet (25 mg total) by mouth daily. 90 tablet 1  . simvastatin (ZOCOR) 20 MG tablet Take 1 tablet (20 mg total) by mouth daily. 90 tablet 1   No current facility-administered medications on file prior to visit.     BP (!) 138/97   Pulse 90   Temp 98.1 F (36.7 C) (Oral)   Resp 16   Ht 6\' 1"  (1.854 m)   Wt 298 lb 6.4 oz (135.4 kg)   SpO2 99%   BMI 39.37 kg/m       Objective:   Physical Exam  General Mental Status- Alert. General Appearance- Not in acute distress.   Skin General: Color- Normal Color.  Moisture- Normal Moisture.  Neck Carotid Arteries- Normal color. Moisture- Normal Moisture. No carotid bruits. No JVD.  Chest and Lung Exam Auscultation: Breath Sounds:-Normal.  Cardiovascular Auscultation:Rythm- Regular. Murmurs & Other Heart Sounds:Auscultation of the heart reveals- No Murmurs.  Abdomen Inspection:-Inspeection Normal. Palpation/Percussion:Note:No mass. Palpation and Percussion of the abdomen reveal- Non Tender, Non Distended + BS, no rebound or guarding.   Neurologic Cranial Nerve exam:- CN III-XII intact(No nystagmus), symmetric smile. Strength:- 5/5 equal and symmetric strength both upper and lower extremities.      Assessment & Plan:  For bp a bit elevated/bordelrine increase losartan to 50 mg daily dose.  For gout hx and mild recent knee pain rt side I will rx colchicine.  For high cholerstol refill your zocor.  For elevated sugar hx, will get a1c along with other labs, cmp, lipid and uric acid.  Follow up 3-6 month or as needed.  Get flu vaccine in fall.    Mackie Pai, PA-C

## 2019-01-16 NOTE — Addendum Note (Signed)
Addended by: Anabel Halon on: 01/16/2019 01:45 PM   Modules accepted: Orders

## 2019-02-01 ENCOUNTER — Other Ambulatory Visit (INDEPENDENT_AMBULATORY_CARE_PROVIDER_SITE_OTHER): Payer: Medicare Other

## 2019-02-01 ENCOUNTER — Other Ambulatory Visit: Payer: Self-pay

## 2019-02-01 DIAGNOSIS — E785 Hyperlipidemia, unspecified: Secondary | ICD-10-CM | POA: Diagnosis not present

## 2019-02-01 DIAGNOSIS — R972 Elevated prostate specific antigen [PSA]: Secondary | ICD-10-CM | POA: Diagnosis not present

## 2019-02-01 DIAGNOSIS — M109 Gout, unspecified: Secondary | ICD-10-CM

## 2019-02-01 DIAGNOSIS — I10 Essential (primary) hypertension: Secondary | ICD-10-CM | POA: Diagnosis not present

## 2019-02-01 DIAGNOSIS — E039 Hypothyroidism, unspecified: Secondary | ICD-10-CM | POA: Diagnosis not present

## 2019-02-01 DIAGNOSIS — R739 Hyperglycemia, unspecified: Secondary | ICD-10-CM

## 2019-02-01 LAB — LIPID PANEL
Cholesterol: 127 mg/dL (ref 0–200)
HDL: 34.5 mg/dL — ABNORMAL LOW (ref >39.00)
LDL Cholesterol: 68 mg/dL (ref 0–99)
NonHDL: 92.19
Total CHOL/HDL Ratio: 4
Triglycerides: 122 mg/dL (ref 0.0–149.0)
VLDL: 24.4 mg/dL (ref 0.0–40.0)

## 2019-02-01 LAB — COMPREHENSIVE METABOLIC PANEL
ALT: 30 U/L (ref 0–53)
AST: 23 U/L (ref 0–37)
Albumin: 4.3 g/dL (ref 3.5–5.2)
Alkaline Phosphatase: 60 U/L (ref 39–117)
BUN: 18 mg/dL (ref 6–23)
CO2: 26 mEq/L (ref 19–32)
Calcium: 9 mg/dL (ref 8.4–10.5)
Chloride: 105 mEq/L (ref 96–112)
Creatinine, Ser: 0.95 mg/dL (ref 0.40–1.50)
GFR: 78.78 mL/min (ref >60.00)
Glucose, Bld: 99 mg/dL (ref 70–99)
Potassium: 4.6 mEq/L (ref 3.5–5.1)
Sodium: 139 mEq/L (ref 135–145)
Total Bilirubin: 0.7 mg/dL (ref 0.2–1.2)
Total Protein: 6.3 g/dL (ref 6.0–8.3)

## 2019-02-01 LAB — URIC ACID: Uric Acid, Serum: 7.5 mg/dL (ref 4.0–7.8)

## 2019-02-01 LAB — TSH: TSH: 3.56 u[IU]/mL (ref 0.35–4.50)

## 2019-02-01 LAB — PSA: PSA: 1.69 ng/mL (ref 0.10–4.00)

## 2019-02-01 LAB — HEMOGLOBIN A1C: Hgb A1c MFr Bld: 5.6 % (ref 4.6–6.5)

## 2019-04-30 ENCOUNTER — Encounter: Payer: Self-pay | Admitting: Medical

## 2019-05-25 ENCOUNTER — Other Ambulatory Visit: Payer: Self-pay

## 2019-05-25 ENCOUNTER — Ambulatory Visit (INDEPENDENT_AMBULATORY_CARE_PROVIDER_SITE_OTHER): Payer: Medicare Other

## 2019-05-25 DIAGNOSIS — Z23 Encounter for immunization: Secondary | ICD-10-CM

## 2019-05-28 ENCOUNTER — Encounter: Payer: Self-pay | Admitting: Medical

## 2019-05-28 MED ORDER — SIMVASTATIN 20 MG PO TABS: 20.0000 mg | ORAL_TABLET | Freq: Every day | ORAL | 1 refills | Status: DC

## 2019-07-01 ENCOUNTER — Other Ambulatory Visit: Payer: Self-pay

## 2019-07-01 ENCOUNTER — Ambulatory Visit (INDEPENDENT_AMBULATORY_CARE_PROVIDER_SITE_OTHER): Payer: Medicare Other | Admitting: Medical

## 2019-07-01 ENCOUNTER — Encounter: Payer: Self-pay | Admitting: Medical

## 2019-07-01 VITALS — Temp 97.0°F

## 2019-07-01 DIAGNOSIS — R059 Cough, unspecified: Secondary | ICD-10-CM

## 2019-07-01 DIAGNOSIS — R05 Cough: Secondary | ICD-10-CM

## 2019-07-01 DIAGNOSIS — Z20828 Contact with and (suspected) exposure to other viral communicable diseases: Secondary | ICD-10-CM | POA: Diagnosis not present

## 2019-07-01 DIAGNOSIS — Z20822 Contact with and (suspected) exposure to covid-19: Secondary | ICD-10-CM

## 2019-07-01 DIAGNOSIS — J3489 Other specified disorders of nose and nasal sinuses: Secondary | ICD-10-CM

## 2019-07-01 MED ORDER — AZITHROMYCIN 250 MG PO TABS: ORAL_TABLET | ORAL | 0 refills | Status: DC

## 2019-07-01 NOTE — Patient Instructions (Signed)
You have some sign/symptoms that might represent covid. Will get you tested today thru drive thru test center. Stay quarantined pending test results. Some nasal congestion and sinus pressure as well. With hx of sinus infections thought best for you to start azithromycin, flonase nasal spray and benzonatate for cough.(has flonase and benzonatate at home).  Will follow covid test results and notify pt of results.  Follow up date to be determined after reviewing covid test results.

## 2019-07-01 NOTE — Progress Notes (Signed)
Subjective:    Patient ID: Jackson French, male    DOB: February 18, 1951, 68 y.o.   MRN: [id number]  HPI  Virtual Visit via Video Note  I connected with Jackson French on 07/01/19 at  8:40 AM EST by a video enabled telemedicine application and verified that I am speaking with the correct person using two identifiers.  Location: Patient: home Provider: office  No bp or pulse check done. He does not have bp cuff.   I discussed the limitations of evaluation and management by telemedicine and the availability of in person appointments. The patient expressed understanding and agreed to proceed.  History of Present Illness: Pt on Saturday morning began with nasal congestion, runny nose and chills. Last night had chills. No documented fever. Pt does keep 68 yr old and 16 yr old child. 20 yr old attends preschool. So congested can't smell or taste normal. But not complete loss. Has dry cough. He has some sinus pressure. No colored mucus if blows nose. He felt pnd last night.  No sob or wheezing.   Observations/Objective:  General-no acute distress, pleasant, oriented. Lungs- on inspection lungs appear unlabored. Neck- no tracheal deviation or jvd on inspection. Neuro- gross motor function appears intact. heent- frontal and maxillary sinus pressure on palpation.   Assessment and Plan: You have some sign/symptoms that might represent covid. Will get you tested today thru drive thru test center. Stay quarantined pending test results. Some nasal congestion and sinus pressure as well. With hx of sinus infections thought best for you to start azithromycin, flonase nasal spray and benzonatate for cough.(has flonase and benzonatate at home).  Will follow covid test results and notify pt of results.  Follow up date to be determined after reviewing covid test results.  Follow Up Instructions:    I discussed the assessment and treatment plan with the patient. The patient was provided an opportunity  to ask questions and all were answered. The patient agreed with the plan and demonstrated an understanding of the instructions.   The patient was advised to call back or seek an in-person evaluation if the symptoms worsen or if the condition fails to improve as anticipated.  I provided 25  minutes of non-face-to-face time during this encounter.   Mackie Pai, PA-C   Review of Systems  Constitutional: Positive for chills. Negative for fatigue and fever.  HENT: Positive for congestion and sore throat.   Respiratory: Positive for cough. Negative for shortness of breath and wheezing.   Cardiovascular: Negative for chest pain and palpitations.  Gastrointestinal: Negative for abdominal pain.  Musculoskeletal: Negative for back pain and myalgias.  Skin: Negative for rash.  Neurological: Negative for dizziness, speech difficulty and headaches.  Hematological: Negative for adenopathy. Does not bruise/bleed easily.  Psychiatric/Behavioral: Negative for behavioral problems and confusion.    Past Medical History:  Diagnosis Date  . Gout   . Hyperlipidemia   . Hypertension   . Thyroid disease      Social History   Socioeconomic History  . Marital status: Married    Spouse name: Not on file  . Number of children: Not on file  . Years of education: Not on file  . Highest education level: Not on file  Occupational History  . Not on file  Social Needs  . Financial resource strain: Not on file  . Food insecurity    Worry: Not on file    Inability: Not on file  . Transportation needs    Medical: Not on  file    Non-medical: Not on file  Tobacco Use  . Smoking status: Never Smoker  . Smokeless tobacco: Never Used  Substance and Sexual Activity  . Alcohol use: Yes  . Drug use: Never  . Sexual activity: Yes  Lifestyle  . Physical activity    Days per week: Not on file    Minutes per session: Not on file  . Stress: Not on file  Relationships  . Social Herbalist on  phone: Not on file    Gets together: Not on file    Attends religious service: Not on file    Active member of club or organization: Not on file    Attends meetings of clubs or organizations: Not on file    Relationship status: Not on file  . Intimate partner violence    Fear of current or ex partner: Not on file    Emotionally abused: Not on file    Physically abused: Not on file    Forced sexual activity: Not on file  Other Topics Concern  . Not on file  Social History Narrative  . Not on file    Past Surgical History:  Procedure Laterality Date  . APPENDECTOMY    . HYDROCELE EXCISION / REPAIR    . THYROID SURGERY    . TONSILLECTOMY    . VASECTOMY      Family History  Problem Relation Age of Onset  . Arthritis Mother   . Breast cancer Mother     Not on File  Current Outpatient Medications on File Prior to Visit  Medication Sig Dispense Refill  . colchicine 0.6 MG tablet Take 1 tablet (0.6 mg total) by mouth 2 (two) times daily. 60 tablet 2  . levothyroxine (SYNTHROID, LEVOTHROID) 112 MCG tablet Take 1 tablet (112 mcg total) by mouth daily before breakfast. 90 tablet 1  . losartan (COZAAR) 50 MG tablet Take 1 tablet (50 mg total) by mouth daily. 90 tablet 1  . simvastatin (ZOCOR) 20 MG tablet Take 1 tablet (20 mg total) by mouth daily. 90 tablet 1   No current facility-administered medications on file prior to visit.     Temp (!) 97 F (36.1 C) Comment: this morning.      Objective:   Physical Exam        Assessment & Plan:

## 2019-07-02 LAB — NOVEL CORONAVIRUS, NAA: SARS-CoV-2, NAA: NOT DETECTED

## 2019-07-29 ENCOUNTER — Other Ambulatory Visit: Payer: Self-pay

## 2019-07-30 ENCOUNTER — Ambulatory Visit (INDEPENDENT_AMBULATORY_CARE_PROVIDER_SITE_OTHER): Payer: Medicare Other | Admitting: Medical

## 2019-07-30 ENCOUNTER — Encounter: Payer: Self-pay | Admitting: Medical

## 2019-07-30 ENCOUNTER — Other Ambulatory Visit: Payer: Self-pay

## 2019-07-30 ENCOUNTER — Telehealth: Payer: Self-pay | Admitting: Medical

## 2019-07-30 ENCOUNTER — Other Ambulatory Visit: Payer: Self-pay | Admitting: *Deleted

## 2019-07-30 VITALS — BP 129/87 | HR 63 | Temp 97.4°F | Resp 18 | Ht 73.0 in | Wt 302.2 lb

## 2019-07-30 DIAGNOSIS — M109 Gout, unspecified: Secondary | ICD-10-CM | POA: Diagnosis not present

## 2019-07-30 DIAGNOSIS — Z125 Encounter for screening for malignant neoplasm of prostate: Secondary | ICD-10-CM

## 2019-07-30 DIAGNOSIS — E785 Hyperlipidemia, unspecified: Secondary | ICD-10-CM | POA: Diagnosis not present

## 2019-07-30 DIAGNOSIS — R972 Elevated prostate specific antigen [PSA]: Secondary | ICD-10-CM

## 2019-07-30 DIAGNOSIS — I10 Essential (primary) hypertension: Secondary | ICD-10-CM | POA: Diagnosis not present

## 2019-07-30 DIAGNOSIS — Z87898 Personal history of other specified conditions: Secondary | ICD-10-CM

## 2019-07-30 DIAGNOSIS — E039 Hypothyroidism, unspecified: Secondary | ICD-10-CM

## 2019-07-30 LAB — LIPID PANEL
Cholesterol: 141 mg/dL (ref 0–200)
HDL: 37.4 mg/dL — ABNORMAL LOW (ref >39.00)
LDL Cholesterol: 73 mg/dL (ref 0–99)
NonHDL: 103.2
Total CHOL/HDL Ratio: 4
Triglycerides: 150 mg/dL — ABNORMAL HIGH (ref 0.0–149.0)
VLDL: 30 mg/dL (ref 0.0–40.0)

## 2019-07-30 LAB — COMPREHENSIVE METABOLIC PANEL
ALT: 30 U/L (ref 0–53)
AST: 21 U/L (ref 0–37)
Albumin: 4.4 g/dL (ref 3.5–5.2)
Alkaline Phosphatase: 67 U/L (ref 39–117)
BUN: 17 mg/dL (ref 6–23)
CO2: 24 mEq/L (ref 19–32)
Calcium: 9 mg/dL (ref 8.4–10.5)
Chloride: 105 mEq/L (ref 96–112)
Creatinine, Ser: 0.88 mg/dL (ref 0.40–1.50)
GFR: 85.93 mL/min (ref >60.00)
Glucose, Bld: 102 mg/dL — ABNORMAL HIGH (ref 70–99)
Potassium: 4.5 mEq/L (ref 3.5–5.1)
Sodium: 138 mEq/L (ref 135–145)
Total Bilirubin: 0.5 mg/dL (ref 0.2–1.2)
Total Protein: 6.5 g/dL (ref 6.0–8.3)

## 2019-07-30 LAB — URIC ACID: Uric Acid, Serum: 6.7 mg/dL (ref 4.0–7.8)

## 2019-07-30 LAB — TSH: TSH: 4.38 u[IU]/mL (ref 0.35–4.50)

## 2019-07-30 LAB — PSA: PSA: 9.2 ng/mL — ABNORMAL HIGH (ref 0.10–4.00)

## 2019-07-30 MED ORDER — CIPROFLOXACIN HCL 500 MG PO TABS: 500.0000 mg | ORAL_TABLET | Freq: Two times a day (BID) | ORAL | 0 refills | Status: DC

## 2019-07-30 MED ORDER — LEVOTHYROXINE SODIUM 112 MCG PO TABS: 112.0000 ug | ORAL_TABLET | Freq: Every day | ORAL | 0 refills | Status: DC

## 2019-07-30 NOTE — Progress Notes (Addendum)
Subjective:    Patient ID: Jackson French, male    DOB: June 16, 1951, 68 y.o.   MRN: [id number]  HPI  Pt is fasting.  Hx of high cholesterol and is on zocor.  Hx of htn, Bp is well controlled today.   Hx of low thyroid and is on synthroid.  Hx of gout and no gout flares recently.  Pt had flu vaccine earlier this year.     Review of Systems  Constitutional: Negative for chills, fatigue and fever.  HENT: Negative for congestion, ear pain, postnasal drip, sinus pressure, sinus pain and sneezing.   Respiratory: Negative for cough, chest tightness, shortness of breath and wheezing.   Cardiovascular: Negative for chest pain and palpitations.  Gastrointestinal: Negative for abdominal pain, constipation and nausea.  Genitourinary: Negative for dysuria and flank pain.  Musculoskeletal: Negative for back pain.  Skin: Negative for rash.  Neurological: Negative for dizziness, syncope, weakness, numbness and headaches.  Hematological: Negative for adenopathy. Does not bruise/bleed easily.  Psychiatric/Behavioral: Negative for agitation, confusion, self-injury and suicidal ideas.    Past Medical History:  Diagnosis Date  . Gout   . Hyperlipidemia   . Hypertension   . Thyroid disease      Social History   Socioeconomic History  . Marital status: Married    Spouse name: Not on file  . Number of children: Not on file  . Years of education: Not on file  . Highest education level: Not on file  Occupational History  . Not on file  Tobacco Use  . Smoking status: Never Smoker  . Smokeless tobacco: Never Used  Substance and Sexual Activity  . Alcohol use: Yes  . Drug use: Never  . Sexual activity: Yes  Other Topics Concern  . Not on file  Social History Narrative  . Not on file   Social Determinants of Health   Financial Resource Strain:   . Difficulty of Paying Living Expenses: Not on file  Food Insecurity:   . Worried About Charity fundraiser in the Last Year: Not on  file  . Ran Out of Food in the Last Year: Not on file  Transportation Needs:   . Lack of Transportation (Medical): Not on file  . Lack of Transportation (Non-Medical): Not on file  Physical Activity:   . Days of Exercise per Week: Not on file  . Minutes of Exercise per Session: Not on file  Stress:   . Feeling of Stress : Not on file  Social Connections:   . Frequency of Communication with Friends and Family: Not on file  . Frequency of Social Gatherings with Friends and Family: Not on file  . Attends Religious Services: Not on file  . Active Member of Clubs or Organizations: Not on file  . Attends Archivist Meetings: Not on file  . Marital Status: Not on file  Intimate Partner Violence:   . Fear of Current or Ex-Partner: Not on file  . Emotionally Abused: Not on file  . Physically Abused: Not on file  . Sexually Abused: Not on file    Past Surgical History:  Procedure Laterality Date  . APPENDECTOMY    . HYDROCELE EXCISION / REPAIR    . THYROID SURGERY    . TONSILLECTOMY    . VASECTOMY      Family History  Problem Relation Age of Onset  . Arthritis Mother   . Breast cancer Mother     No Known Allergies  Current Outpatient Medications  on File Prior to Visit  Medication Sig Dispense Refill  . levothyroxine (SYNTHROID, LEVOTHROID) 112 MCG tablet Take 1 tablet (112 mcg total) by mouth daily before breakfast. 90 tablet 1  . losartan (COZAAR) 50 MG tablet Take 1 tablet (50 mg total) by mouth daily. 90 tablet 1  . simvastatin (ZOCOR) 20 MG tablet Take 1 tablet (20 mg total) by mouth daily. 90 tablet 1   No current facility-administered medications on file prior to visit.    BP 129/87 (BP Location: Left Arm, Patient Position: Sitting, Cuff Size: Normal)   Pulse 63   Temp (!) 97.4 F (36.3 C) (Temporal)   Resp 18   Wt (!) 302 lb 3.2 oz (137.1 kg)   SpO2 99%   BMI 39.87 kg/m       Objective:   Physical Exam  General Mental Status- Alert. General  Appearance- Not in acute distress.   Skin General: Color- Normal Color. Moisture- Normal Moisture.  Neck Carotid Arteries- Normal color. Moisture- Normal Moisture. No carotid bruits. No JVD.  Chest and Lung Exam Auscultation: Breath Sounds:-Normal.  Cardiovascular Auscultation:Rythm- Regular. Murmurs & Other Heart Sounds:Auscultation of the heart reveals- No Murmurs.  Abdomen Inspection:-Inspeection Normal. Palpation/Percussion:Note:No mass. Palpation and Percussion of the abdomen reveal- Non Tender, Non Distended + BS, no rebound or guarding.  Neurologic Cranial Nerve exam:- CN III-XII intact(No nystagmus), symmetric smile. Strength:- 5/5 equal and symmetric strength both upper and lower extremities.      Assessment & Plan:  Your bp is well controlled today. Continue losartan.  For high cholesterol will get cmp and lipid panel today. Continue zocor.  For low thyroid will get tsh.  For hx of gout will get uric acid.  Follow up date to be determined after lab review.   Psa ordered on day of visit due to history of elevated psa in the past. Pt is followed by urologist and his psa transiently came down to normal level and on review of psa value again is elevated. So will need to see urologist of increased prostate velocity. Will follow urologist note.    Mackie Pai, PA-C

## 2019-07-30 NOTE — Patient Instructions (Addendum)
Your bp is well controlled today. Continue losartan.  For high cholesterol will get cmp and lipid panel today. Continue zocor.  For low thyroid will get tsh.  For hx of gout will get uric acid.  Follow up date to be determined after lab review.   Psa ordered on day of visit due to history of elevated psa in the past. Pt is followed by urologist and his psa transiently came down to normal level and on review of psa value again is elevated. So will need to see urologist of increased prostate velocity. Will follow urologist note.

## 2019-07-30 NOTE — Telephone Encounter (Signed)
Rx cipro sent to pt pharmacy. 

## 2019-08-09 ENCOUNTER — Encounter: Payer: Self-pay | Admitting: Medical

## 2019-08-23 ENCOUNTER — Encounter: Payer: Self-pay | Admitting: Medical

## 2019-08-26 ENCOUNTER — Encounter: Payer: Self-pay | Admitting: Medical

## 2019-08-27 DIAGNOSIS — N5201 Erectile dysfunction due to arterial insufficiency: Secondary | ICD-10-CM | POA: Diagnosis not present

## 2019-08-27 DIAGNOSIS — R972 Elevated prostate specific antigen [PSA]: Secondary | ICD-10-CM | POA: Diagnosis not present

## 2019-09-21 ENCOUNTER — Other Ambulatory Visit: Payer: Self-pay | Admitting: Medical

## 2019-09-24 ENCOUNTER — Encounter: Payer: Self-pay | Admitting: Medical

## 2019-09-25 ENCOUNTER — Encounter: Payer: Self-pay | Admitting: Medical

## 2019-09-25 DIAGNOSIS — Z20822 Contact with and (suspected) exposure to covid-19: Secondary | ICD-10-CM | POA: Diagnosis not present

## 2019-09-29 ENCOUNTER — Encounter: Payer: Self-pay | Admitting: Medical

## 2019-09-30 ENCOUNTER — Ambulatory Visit: Payer: Medicare Other | Attending: Internal Medicine

## 2019-09-30 DIAGNOSIS — Z20822 Contact with and (suspected) exposure to covid-19: Secondary | ICD-10-CM | POA: Diagnosis not present

## 2019-10-01 LAB — NOVEL CORONAVIRUS, NAA: SARS-CoV-2, NAA: NOT DETECTED

## 2019-11-17 ENCOUNTER — Other Ambulatory Visit: Payer: Self-pay | Admitting: Medical

## 2019-11-18 ENCOUNTER — Encounter: Payer: Self-pay | Admitting: Medical

## 2019-11-19 NOTE — Telephone Encounter (Signed)
Would you like to see this patient in office?

## 2019-11-19 NOTE — Telephone Encounter (Signed)
Cough and runny nose still present. How do you want to go about him getting antibiotics .

## 2019-11-21 ENCOUNTER — Telehealth: Payer: Medicare Other | Admitting: Medical

## 2019-11-21 ENCOUNTER — Other Ambulatory Visit: Payer: Self-pay

## 2019-11-29 NOTE — Progress Notes (Signed)
Pt canceled appoitment.

## 2019-12-10 ENCOUNTER — Other Ambulatory Visit: Payer: Self-pay | Admitting: Medical

## 2019-12-11 ENCOUNTER — Encounter: Payer: Self-pay | Admitting: Medical

## 2019-12-11 ENCOUNTER — Ambulatory Visit (INDEPENDENT_AMBULATORY_CARE_PROVIDER_SITE_OTHER): Payer: Medicare Other | Admitting: Medical

## 2019-12-11 ENCOUNTER — Other Ambulatory Visit: Payer: Self-pay

## 2019-12-11 VITALS — BP 145/80 | HR 63 | Temp 97.0°F | Resp 18 | Ht 73.0 in | Wt 299.6 lb

## 2019-12-11 DIAGNOSIS — R350 Frequency of micturition: Secondary | ICD-10-CM | POA: Diagnosis not present

## 2019-12-11 DIAGNOSIS — E785 Hyperlipidemia, unspecified: Secondary | ICD-10-CM | POA: Diagnosis not present

## 2019-12-11 DIAGNOSIS — I10 Essential (primary) hypertension: Secondary | ICD-10-CM

## 2019-12-11 DIAGNOSIS — Z87438 Personal history of other diseases of male genital organs: Secondary | ICD-10-CM | POA: Diagnosis not present

## 2019-12-11 LAB — COMPREHENSIVE METABOLIC PANEL
ALT: 32 U/L (ref 0–53)
AST: 26 U/L (ref 0–37)
Albumin: 4.4 g/dL (ref 3.5–5.2)
Alkaline Phosphatase: 58 U/L (ref 39–117)
BUN: 18 mg/dL (ref 6–23)
CO2: 29 mEq/L (ref 19–32)
Calcium: 9 mg/dL (ref 8.4–10.5)
Chloride: 104 mEq/L (ref 96–112)
Creatinine, Ser: 0.93 mg/dL (ref 0.40–1.50)
GFR: 80.53 mL/min (ref >60.00)
Glucose, Bld: 100 mg/dL — ABNORMAL HIGH (ref 70–99)
Potassium: 4.5 mEq/L (ref 3.5–5.1)
Sodium: 137 mEq/L (ref 135–145)
Total Bilirubin: 0.7 mg/dL (ref 0.2–1.2)
Total Protein: 6.4 g/dL (ref 6.0–8.3)

## 2019-12-11 LAB — PSA: PSA: 1.85 ng/mL (ref 0.10–4.00)

## 2019-12-11 LAB — POC URINALSYSI DIPSTICK (AUTOMATED)
Bilirubin, UA: NEGATIVE
Blood, UA: NEGATIVE
Glucose, UA: NEGATIVE
Ketones, UA: NEGATIVE
Leukocytes, UA: NEGATIVE
Nitrite, UA: NEGATIVE
Protein, UA: NEGATIVE
Spec Grav, UA: 1.015 (ref 1.010–1.025)
Urobilinogen, UA: 0.2 E.U./dL
pH, UA: 6 (ref 5.0–8.0)

## 2019-12-11 LAB — LIPID PANEL
Cholesterol: 140 mg/dL (ref 0–200)
HDL: 35.1 mg/dL — ABNORMAL LOW (ref >39.00)
LDL Cholesterol: 77 mg/dL (ref 0–99)
NonHDL: 104.65
Total CHOL/HDL Ratio: 4
Triglycerides: 138 mg/dL (ref 0.0–149.0)
VLDL: 27.6 mg/dL (ref 0.0–40.0)

## 2019-12-11 NOTE — Progress Notes (Signed)
Subjective:    Patient ID: Jackson French, male    DOB: 03-12-1951, 69 y.o.   MRN: [id number]  HPI  Pt in for follow up.  Pt bp is high initially. He is on losartan 25 mg bid. No cardiac or neurologic signs/symptoms.  Pt states recently about 2 weeks ago had some bilateral cva pain with some frequent urination. Pain in back was moderate to severe. Symptoms went away after 2-3 days. No reoccurence. Pt had hx of prostatitis in the past.  Pt had alternating levels of psa in the past. Pt last psa after elevated leve of 9.2 came back down to 1.85. on August 28, 2019.  Over past week little frequent urination.    Pt has high cholesterol and is on zocor.     Review of Systems  Constitutional: Negative for chills, fatigue and fever.  Respiratory: Negative for cough, chest tightness, shortness of breath and wheezing.   Cardiovascular: Negative for chest pain and palpitations.  Gastrointestinal: Negative for abdominal pain.  Genitourinary: Positive for frequency. Negative for dysuria, penile pain, testicular pain and urgency.       Slight frequency of urination.  Musculoskeletal: Negative for back pain.  Skin: Negative for rash.  Hematological: Negative for adenopathy. Does not bruise/bleed easily.    Past Medical History:  Diagnosis Date  . Gout   . Hyperlipidemia   . Hypertension   . Thyroid disease      Social History   Socioeconomic History  . Marital status: Married    Spouse name: Not on file  . Number of children: Not on file  . Years of education: Not on file  . Highest education level: Not on file  Occupational History  . Not on file  Tobacco Use  . Smoking status: Never Smoker  . Smokeless tobacco: Never Used  Substance and Sexual Activity  . Alcohol use: Yes  . Drug use: Never  . Sexual activity: Yes  Other Topics Concern  . Not on file  Social History Narrative  . Not on file   Social Determinants of Health   Financial Resource Strain:   .  Difficulty of Paying Living Expenses:   Food Insecurity:   . Worried About Charity fundraiser in the Last Year:   . Arboriculturist in the Last Year:   Transportation Needs:   . Film/video editor (Medical):   Marland Kitchen Lack of Transportation (Non-Medical):   Physical Activity:   . Days of Exercise per Week:   . Minutes of Exercise per Session:   Stress:   . Feeling of Stress :   Social Connections:   . Frequency of Communication with Friends and Family:   . Frequency of Social Gatherings with Friends and Family:   . Attends Religious Services:   . Active Member of Clubs or Organizations:   . Attends Archivist Meetings:   Marland Kitchen Marital Status:   Intimate Partner Violence:   . Fear of Current or Ex-Partner:   . Emotionally Abused:   Marland Kitchen Physically Abused:   . Sexually Abused:     Past Surgical History:  Procedure Laterality Date  . APPENDECTOMY    . HYDROCELE EXCISION / REPAIR    . THYROID SURGERY    . TONSILLECTOMY    . VASECTOMY      Family History  Problem Relation Age of Onset  . Arthritis Mother   . Breast cancer Mother     No Known Allergies  Current Outpatient  Medications on File Prior to Visit  Medication Sig Dispense Refill  . ciprofloxacin (CIPRO) 500 MG tablet Take 1 tablet (500 mg total) by mouth 2 (two) times daily. (Patient not taking: Reported on 11/21/2019) 20 tablet 0  . levothyroxine (SYNTHROID) 112 MCG tablet Take 1 tablet (112 mcg total) by mouth daily before breakfast. 90 tablet 0  . losartan (COZAAR) 25 MG tablet TAKE 1 TABLET BY MOUTH EVERY DAY 90 tablet 1  . simvastatin (ZOCOR) 20 MG tablet TAKE 1 TABLET BY MOUTH EVERY DAY 90 tablet 1   No current facility-administered medications on file prior to visit.    BP (!) 145/80   Pulse 63   Temp (!) 97 F (36.1 C) (Temporal)   Resp 18   Ht 6\' 1"  (1.854 m)   Wt 299 lb 9.6 oz (135.9 kg)   SpO2 97%   BMI 39.53 kg/m       Objective:   Physical Exam  General Mental Status- Alert.  General Appearance- Not in acute distress.   Skin General: Color- Normal Color. Moisture- Normal Moisture.  Neck Carotid Arteries- Normal color. Moisture- Normal Moisture. No carotid bruits. No JVD.  Chest and Lung Exam Auscultation: Breath Sounds:-Normal.  Cardiovascular Auscultation:Rythm- Regular. Murmurs & Other Heart Sounds:Auscultation of the heart reveals- No Murmurs.    Back- no cva tenderness.  Neurologic Cranial Nerve exam:- CN III-XII intact(No nystagmus), symmetric smile. Strength:- 5/5 equal and symmetric strength both upper and lower extremities.      Assessment & Plan:  For frequent urination and hx of elevated psa will get ua, urine culture and psa. Only faint frequent urination so won't treat unless symptoms worsens or studies come back positive.  For htn continue losartan 25 mg twice daily.  Check bp at home on occasion confirm less than 140/90. Prefer closer to 130/80.  Follow up date to be determined after lab review.    Mackie Pai, PA-C   Time spent with patient today was  25  minutes which consisted of chart revdiew, discussing diagnosis, work up, treatment and documentation.

## 2019-12-11 NOTE — Patient Instructions (Signed)
For frequent urination and hx of elevated psa will get ua, urine culture and psa. Only faint frequent urination so won't treat unless symptoms worsens or studies come back positive.  For htn continue losartan 25 mg twice daily.  Check bp at home on occasion confirm less than 140/90. Prefer closer to 130/80.  Follow up date to be determined after lab review.

## 2019-12-24 ENCOUNTER — Other Ambulatory Visit: Payer: Self-pay | Admitting: Medical

## 2020-02-21 DIAGNOSIS — R972 Elevated prostate specific antigen [PSA]: Secondary | ICD-10-CM | POA: Diagnosis not present

## 2020-03-03 DIAGNOSIS — N400 Enlarged prostate without lower urinary tract symptoms: Secondary | ICD-10-CM | POA: Diagnosis not present

## 2020-03-03 DIAGNOSIS — Z125 Encounter for screening for malignant neoplasm of prostate: Secondary | ICD-10-CM | POA: Diagnosis not present

## 2020-03-11 DIAGNOSIS — H353131 Nonexudative age-related macular degeneration, bilateral, early dry stage: Secondary | ICD-10-CM | POA: Diagnosis not present

## 2020-03-11 DIAGNOSIS — H2513 Age-related nuclear cataract, bilateral: Secondary | ICD-10-CM | POA: Diagnosis not present

## 2020-03-11 DIAGNOSIS — H25013 Cortical age-related cataract, bilateral: Secondary | ICD-10-CM | POA: Diagnosis not present

## 2020-03-11 DIAGNOSIS — H43813 Vitreous degeneration, bilateral: Secondary | ICD-10-CM | POA: Diagnosis not present

## 2020-03-11 DIAGNOSIS — H2511 Age-related nuclear cataract, right eye: Secondary | ICD-10-CM | POA: Diagnosis not present

## 2020-03-11 DIAGNOSIS — H25043 Posterior subcapsular polar age-related cataract, bilateral: Secondary | ICD-10-CM | POA: Diagnosis not present

## 2020-03-18 ENCOUNTER — Other Ambulatory Visit: Payer: Self-pay | Admitting: Medical

## 2020-04-06 ENCOUNTER — Other Ambulatory Visit: Payer: Self-pay | Admitting: Medical

## 2020-04-14 DIAGNOSIS — H25012 Cortical age-related cataract, left eye: Secondary | ICD-10-CM | POA: Diagnosis not present

## 2020-04-14 DIAGNOSIS — H2511 Age-related nuclear cataract, right eye: Secondary | ICD-10-CM | POA: Diagnosis not present

## 2020-04-14 DIAGNOSIS — H2512 Age-related nuclear cataract, left eye: Secondary | ICD-10-CM | POA: Diagnosis not present

## 2020-04-14 DIAGNOSIS — H25042 Posterior subcapsular polar age-related cataract, left eye: Secondary | ICD-10-CM | POA: Diagnosis not present

## 2020-04-21 DIAGNOSIS — H2512 Age-related nuclear cataract, left eye: Secondary | ICD-10-CM | POA: Diagnosis not present

## 2020-05-10 ENCOUNTER — Other Ambulatory Visit: Payer: Self-pay | Admitting: Medical

## 2020-05-15 ENCOUNTER — Encounter: Payer: Self-pay | Admitting: Medical

## 2020-06-03 ENCOUNTER — Other Ambulatory Visit: Payer: Self-pay | Admitting: Medical

## 2020-06-16 ENCOUNTER — Other Ambulatory Visit (HOSPITAL_BASED_OUTPATIENT_CLINIC_OR_DEPARTMENT_OTHER): Payer: Self-pay | Admitting: Internal Medicine

## 2020-06-16 ENCOUNTER — Ambulatory Visit: Payer: Medicare Other | Attending: Internal Medicine

## 2020-06-16 DIAGNOSIS — Z23 Encounter for immunization: Secondary | ICD-10-CM

## 2020-06-19 MED FILL — MODERNA COVID-19 VACCINE 10: 100 | 1 days supply | Qty: 0 | Fill #0

## 2020-06-25 ENCOUNTER — Encounter: Payer: Self-pay | Admitting: Medical

## 2020-06-28 ENCOUNTER — Other Ambulatory Visit: Payer: Self-pay | Admitting: Medical

## 2020-07-14 ENCOUNTER — Telehealth: Payer: Self-pay | Admitting: Medical

## 2020-07-14 ENCOUNTER — Encounter: Payer: Self-pay | Admitting: Medical

## 2020-07-14 ENCOUNTER — Other Ambulatory Visit: Payer: Self-pay

## 2020-07-14 ENCOUNTER — Ambulatory Visit (INDEPENDENT_AMBULATORY_CARE_PROVIDER_SITE_OTHER): Payer: Medicare Other | Admitting: Medical

## 2020-07-14 VITALS — BP 146/73 | HR 69 | Temp 97.6°F | Resp 12 | Ht 73.0 in | Wt 312.4 lb

## 2020-07-14 DIAGNOSIS — E785 Hyperlipidemia, unspecified: Secondary | ICD-10-CM | POA: Diagnosis not present

## 2020-07-14 DIAGNOSIS — R059 Cough, unspecified: Secondary | ICD-10-CM

## 2020-07-14 DIAGNOSIS — J3489 Other specified disorders of nose and nasal sinuses: Secondary | ICD-10-CM

## 2020-07-14 DIAGNOSIS — I10 Essential (primary) hypertension: Secondary | ICD-10-CM

## 2020-07-14 DIAGNOSIS — R972 Elevated prostate specific antigen [PSA]: Secondary | ICD-10-CM

## 2020-07-14 DIAGNOSIS — J309 Allergic rhinitis, unspecified: Secondary | ICD-10-CM

## 2020-07-14 LAB — LIPID PANEL
Cholesterol: 130 mg/dL (ref 0–200)
HDL: 35.8 mg/dL — ABNORMAL LOW (ref >39.00)
LDL Cholesterol: 66 mg/dL (ref 0–99)
NonHDL: 94.49
Total CHOL/HDL Ratio: 4
Triglycerides: 142 mg/dL (ref 0.0–149.0)
VLDL: 28.4 mg/dL (ref 0.0–40.0)

## 2020-07-14 LAB — COMPREHENSIVE METABOLIC PANEL
ALT: 26 U/L (ref 0–53)
AST: 20 U/L (ref 0–37)
Albumin: 4.3 g/dL (ref 3.5–5.2)
Alkaline Phosphatase: 65 U/L (ref 39–117)
BUN: 12 mg/dL (ref 6–23)
CO2: 27 mEq/L (ref 19–32)
Calcium: 8.8 mg/dL (ref 8.4–10.5)
Chloride: 104 mEq/L (ref 96–112)
Creatinine, Ser: 0.92 mg/dL (ref 0.40–1.50)
GFR: 84.76 mL/min (ref >60.00)
Glucose, Bld: 101 mg/dL — ABNORMAL HIGH (ref 70–99)
Potassium: 4.1 mEq/L (ref 3.5–5.1)
Sodium: 138 mEq/L (ref 135–145)
Total Bilirubin: 0.6 mg/dL (ref 0.2–1.2)
Total Protein: 6.5 g/dL (ref 6.0–8.3)

## 2020-07-14 LAB — PSA: PSA: 4.85 ng/mL — ABNORMAL HIGH (ref 0.10–4.00)

## 2020-07-14 MED ORDER — LOSARTAN POTASSIUM 100 MG PO TABS: 100.0000 mg | ORAL_TABLET | Freq: Every day | ORAL | 3 refills | Status: DC

## 2020-07-14 MED ORDER — FLUTICASONE PROPIONATE 50 MCG/ACT NA SUSP: 2.0000 | Freq: Every day | NASAL | 1 refills | Status: DC

## 2020-07-14 MED ORDER — LEVOCETIRIZINE DIHYDROCHLORIDE 5 MG PO TABS: 5.0000 mg | ORAL_TABLET | Freq: Every evening | ORAL | 3 refills | Status: DC

## 2020-07-14 MED ORDER — SULFAMETHOXAZOLE-TRIMETHOPRIM 800-160 MG PO TABS: 1.0000 | ORAL_TABLET | Freq: Two times a day (BID) | ORAL | 0 refills | Status: DC

## 2020-07-14 MED ORDER — AZITHROMYCIN 250 MG PO TABS: ORAL_TABLET | ORAL | 0 refills | Status: DC

## 2020-07-14 MED ORDER — BENZONATATE 100 MG PO CAPS: 100.0000 mg | ORAL_CAPSULE | Freq: Three times a day (TID) | ORAL | 0 refills | Status: DC | PRN

## 2020-07-14 NOTE — Patient Instructions (Signed)
Cough for various weeks now.  Possible allergic rhinitis related.  Will prescribe Xyzal antihistamine, Flonase nasal spray and benzonatate cough tablets.  Recent sinus pressure/possible sinus infection.  Note cough also intermittently productive.  Will prescribe a azithromycin antibiotic.  History of hyperlipidemia.  Will get lipid panel and metabolic panel today.  Your blood pressure is elevated today and consistently about 550 systolic at home.  So we will increase losartan to 100 mg daily.  History of elevated PSA in the past.  Your urologist recommended checking intermittently to make sure staying low.  We will repeat PSA today.  Follow-up date to be determined after lab review.

## 2020-07-14 NOTE — Progress Notes (Signed)
Subjective:    Patient ID: Jackson French, male    DOB: [birth year], 69 y.o.   MRN: [id number]  HPI  Pt in for evaluation.  Pt states he had some nasal congestion since Jun 16, 2020. He had pnd since then. When he lays down he will cough. Cough is occasional colored/green.   No wheezing. No fever, no chills or sweats. Some sneezing early on.  Symptoms occur day after covid vaccine.  Some allergy symptom when he blows leaves if does not wear mask. But he does wear mask.  BP reported to be elevated recently consistently 891 systolic range when you check at home.  No cardiac or neurologic signs or symptoms.    Review of Systems  Constitutional: Negative for chills, fatigue and fever.  HENT: Positive for congestion and postnasal drip. Negative for sinus pressure and sore throat.   Respiratory: Positive for cough. Negative for shortness of breath and wheezing.   Cardiovascular: Negative for chest pain and palpitations.  Gastrointestinal: Negative for abdominal pain.  Genitourinary: Negative for dysuria and flank pain.  Musculoskeletal: Negative for back pain.  Skin: Negative for rash.  Neurological: Negative for dizziness, tremors, seizures, weakness and headaches.  Hematological: Negative for adenopathy. Does not bruise/bleed easily.  Psychiatric/Behavioral: Negative for behavioral problems and confusion.    Past Medical History:  Diagnosis Date  . Gout   . Hyperlipidemia   . Hypertension   . Thyroid disease      Social History   Socioeconomic History  . Marital status: Married    Spouse name: Not on file  . Number of children: Not on file  . Years of education: Not on file  . Highest education level: Not on file  Occupational History  . Not on file  Tobacco Use  . Smoking status: Never Smoker  . Smokeless tobacco: Never Used  Substance and Sexual Activity  . Alcohol use: Yes  . Drug use: Never  . Sexual activity: Yes  Other Topics Concern  . Not on file    Social History Narrative  . Not on file   Social Determinants of Health   Financial Resource Strain:   . Difficulty of Paying Living Expenses: Not on file  Food Insecurity:   . Worried About Charity fundraiser in the Last Year: Not on file  . Ran Out of Food in the Last Year: Not on file  Transportation Needs:   . Lack of Transportation (Medical): Not on file  . Lack of Transportation (Non-Medical): Not on file  Physical Activity:   . Days of Exercise per Week: Not on file  . Minutes of Exercise per Session: Not on file  Stress:   . Feeling of Stress : Not on file  Social Connections:   . Frequency of Communication with Friends and Family: Not on file  . Frequency of Social Gatherings with Friends and Family: Not on file  . Attends Religious Services: Not on file  . Active Member of Clubs or Organizations: Not on file  . Attends Archivist Meetings: Not on file  . Marital Status: Not on file  Intimate Partner Violence:   . Fear of Current or Ex-Partner: Not on file  . Emotionally Abused: Not on file  . Physically Abused: Not on file  . Sexually Abused: Not on file    Past Surgical History:  Procedure Laterality Date  . APPENDECTOMY    . HYDROCELE EXCISION / REPAIR    . THYROID SURGERY    .  TONSILLECTOMY    . VASECTOMY      Family History  Problem Relation Age of Onset  . Arthritis Mother   . Breast cancer Mother     No Known Allergies  Current Outpatient Medications on File Prior to Visit  Medication Sig Dispense Refill  . levothyroxine (SYNTHROID) 112 MCG tablet TAKE 1 TABLET (112 MCG TOTAL) BY MOUTH DAILY BEFORE BREAKFAST. 90 tablet 0  . losartan (COZAAR) 25 MG tablet TAKE 2 TABLETS BY MOUTH EVERY DAY 180 tablet 1  . simvastatin (ZOCOR) 20 MG tablet TAKE 1 TABLET BY MOUTH EVERY DAY 30 tablet 0   No current facility-administered medications on file prior to visit.    BP (!) 146/73 (BP Location: Left Arm, Cuff Size: Normal)   Pulse 69   Temp  97.6 F (36.4 C) (Oral)   Resp 12   Ht 6\' 1"  (1.854 m)   Wt (!) 312 lb 6.4 oz (141.7 kg)   SpO2 99%   BMI 41.22 kg/m       Objective:   Physical Exam  General Mental Status- Alert. General Appearance- Not in acute distress.   Skin General: Color- Normal Color. Moisture- Normal Moisture.  Neck Carotid Arteries- Normal color. Moisture- Normal Moisture. No carotid bruits. No JVD.  Chest and Lung Exam Auscultation: Breath Sounds:-Normal.  Cardiovascular Auscultation:Rythm- Regular. Murmurs & Other Heart Sounds:Auscultation of the heart reveals- No Murmurs.  Abdomen Inspection:-Inspeection Normal. Palpation/Percussion:Note:No mass. Palpation and Percussion of the abdomen reveal- Non Tender, Non Distended + BS, no rebound or guarding.   Neurologic Cranial Nerve exam:- CN III-XII intact(No nystagmus), symmetric smile. Strength:- 5/5 equal and symmetric strength both upper and lower extremities.  heent No sinus pressure pressure. Canals show wax obstruction. Pnd moderate. Mild boggy turbinates.      Assessment & Plan:  Cough for various weeks now.  Possible allergic rhinitis related.  Will prescribe Xyzal antihistamine, Flonase nasal spray and benzonatate cough tablets.  Recent sinus pressure/possible sinus infection.  Note cough also intermittently productive.  Will prescribe a azithromycin antibiotic.  History of hyperlipidemia.  Will get lipid panel and metabolic panel today.  Your blood pressure is elevated today and consistently about 128 systolic at home.  So we will increase losartan to 100 mg daily.  History of elevated PSA in the past.  Your urologist recommended checking intermittently to make sure staying low.  We will repeat PSA today.  Follow-up date to be determined after lab review.

## 2020-07-14 NOTE — Telephone Encounter (Signed)
Rx bactrim dx sent to pharmacy. Futures psa placed.

## 2020-07-15 ENCOUNTER — Other Ambulatory Visit: Payer: Self-pay | Admitting: Medical

## 2020-07-16 DIAGNOSIS — H25093 Other age-related incipient cataract, bilateral: Secondary | ICD-10-CM | POA: Diagnosis not present

## 2020-07-16 DIAGNOSIS — H40053 Ocular hypertension, bilateral: Secondary | ICD-10-CM | POA: Diagnosis not present

## 2020-07-16 DIAGNOSIS — H353131 Nonexudative age-related macular degeneration, bilateral, early dry stage: Secondary | ICD-10-CM | POA: Diagnosis not present

## 2020-07-16 DIAGNOSIS — Z961 Presence of intraocular lens: Secondary | ICD-10-CM | POA: Diagnosis not present

## 2020-07-21 ENCOUNTER — Other Ambulatory Visit: Payer: Self-pay | Admitting: Medical

## 2020-08-05 ENCOUNTER — Other Ambulatory Visit: Payer: Self-pay | Admitting: Medical

## 2020-08-18 ENCOUNTER — Other Ambulatory Visit: Payer: Self-pay | Admitting: Medical

## 2020-09-03 ENCOUNTER — Other Ambulatory Visit: Payer: Self-pay | Admitting: Medical

## 2020-09-13 ENCOUNTER — Other Ambulatory Visit: Payer: Self-pay | Admitting: Medical

## 2020-09-28 ENCOUNTER — Other Ambulatory Visit: Payer: Self-pay | Admitting: Medical

## 2020-10-01 ENCOUNTER — Encounter: Payer: Self-pay | Admitting: Medical

## 2020-10-02 ENCOUNTER — Encounter: Payer: Self-pay | Admitting: Medical

## 2020-10-02 DIAGNOSIS — I10 Essential (primary) hypertension: Secondary | ICD-10-CM

## 2020-10-02 MED ORDER — AMLODIPINE BESYLATE 10 MG PO TABS: 10.0000 mg | ORAL_TABLET | Freq: Every day | ORAL | 3 refills | Status: DC

## 2020-10-08 ENCOUNTER — Other Ambulatory Visit: Payer: Self-pay | Admitting: Medical

## 2020-10-12 ENCOUNTER — Other Ambulatory Visit: Payer: Self-pay | Admitting: Medical

## 2020-10-13 ENCOUNTER — Ambulatory Visit (INDEPENDENT_AMBULATORY_CARE_PROVIDER_SITE_OTHER): Payer: Medicare Other | Admitting: Medical

## 2020-10-13 ENCOUNTER — Other Ambulatory Visit: Payer: Self-pay | Admitting: Medical

## 2020-10-13 ENCOUNTER — Other Ambulatory Visit: Payer: Self-pay

## 2020-10-13 ENCOUNTER — Encounter: Payer: Self-pay | Admitting: Medical

## 2020-10-13 VITALS — BP 125/80 | HR 63 | Temp 98.2°F | Resp 18 | Ht 73.0 in | Wt 314.6 lb

## 2020-10-13 DIAGNOSIS — G4733 Obstructive sleep apnea (adult) (pediatric): Secondary | ICD-10-CM | POA: Diagnosis not present

## 2020-10-13 DIAGNOSIS — Z6835 Body mass index (BMI) 35.0-35.9, adult: Secondary | ICD-10-CM | POA: Diagnosis not present

## 2020-10-13 DIAGNOSIS — M545 Low back pain, unspecified: Secondary | ICD-10-CM | POA: Diagnosis not present

## 2020-10-13 DIAGNOSIS — I10 Essential (primary) hypertension: Secondary | ICD-10-CM

## 2020-10-13 MED ORDER — LOSARTAN POTASSIUM 100 MG PO TABS: 100.0000 mg | ORAL_TABLET | Freq: Every day | ORAL | 3 refills | Status: DC

## 2020-10-13 MED FILL — LOSARTAN POTASSIUM 100 MG T: 100 | 90 days supply | Qty: 90 | Fill #0

## 2020-10-13 NOTE — Telephone Encounter (Signed)
Would you call downstairs to verify that they have losartan. They have consistently had in stock. [facility] issue recently.  If in stock let pt know he can get refills downstairs.  Thanks,

## 2020-10-13 NOTE — Telephone Encounter (Signed)
Losartan doses on long term back order- please advise.

## 2020-10-13 NOTE — Telephone Encounter (Signed)
Ok. If you would let him now. He did not mention he was out but notitfy him [facility] rx losartan sent in.

## 2020-10-13 NOTE — Patient Instructions (Addendum)
Your blood pressure is decently controlled today. Would recommend that you continue losartan 100 mg daily and amlodipine 10 mg daily.  Obesity/ Weight playing role in elevated blood pressure.  Would recommend that you try weight watchers app.  Your back pain appear to be weight related and activity related. If pain returns and persists then can get xray of your lumbar spine.  History of sleep apnea. Offered referral but declined. So recommend weight loss presently.  Follow up in 3 months or as needed

## 2020-10-13 NOTE — Progress Notes (Signed)
Subjective:    Patient ID: Jackson French, male    DOB: [birth year], 70 y.o.   MRN: [id number]  HPI  Pt has history of high blood pressure. Pt in December increased losartan from 50 mg to 100 mg. Then about 2-3 weeks ago when moving and lifting things moving he checked his bp and was 225/100. Then he started to check his blood pressure frequently and he sent me a message and I added amlodipine 10 mg daily. Pt has been checking hi bp daily. His bp checks twice a day.   His bp readings have been 155/88, 171/98, 144/89, 126/77, 139/83, 146/83, 138/99,143/87, 123/73 and 143/89. Bp has been trending downward.  Pt weight has been elevated. Gained weight since covid.   Pt has intermittent mild back pain with moving houses.    Review of Systems  Constitutional: Negative for chills, fatigue and fever.  HENT: Negative for congestion and drooling.   Respiratory: Negative for cough, chest tightness, shortness of breath and wheezing.   Cardiovascular: Negative for chest pain and palpitations.  Gastrointestinal: Negative for abdominal pain, blood in stool, diarrhea, nausea and vomiting.  Musculoskeletal: Negative for back pain, joint swelling, myalgias and neck stiffness.  Skin: Negative for rash.  Neurological: Negative for dizziness, seizures, syncope, weakness and headaches.  Hematological: Negative for adenopathy. Does not bruise/bleed easily.  Psychiatric/Behavioral: Negative for behavioral problems, decreased concentration and suicidal ideas. The patient is not nervous/anxious.     Past Medical History:  Diagnosis Date  . Gout   . Hyperlipidemia   . Hypertension   . Thyroid disease      Social History   Socioeconomic History  . Marital status: Married    Spouse name: Not on file  . Number of children: Not on file  . Years of education: Not on file  . Highest education level: Not on file  Occupational History  . Not on file  Tobacco Use  . Smoking status: Never Smoker  .  Smokeless tobacco: Never Used  Substance and Sexual Activity  . Alcohol use: Yes  . Drug use: Never  . Sexual activity: Yes  Other Topics Concern  . Not on file  Social History Narrative  . Not on file   Social Determinants of Health   Financial Resource Strain: Not on file  Food Insecurity: Not on file  Transportation Needs: Not on file  Physical Activity: Not on file  Stress: Not on file  Social Connections: Not on file  Intimate Partner Violence: Not on file    Past Surgical History:  Procedure Laterality Date  . APPENDECTOMY    . HYDROCELE EXCISION / REPAIR    . THYROID SURGERY    . TONSILLECTOMY    . VASECTOMY      Family History  Problem Relation Age of Onset  . Arthritis Mother   . Breast cancer Mother     No Known Allergies  Current Outpatient Medications on File Prior to Visit  Medication Sig Dispense Refill  . amLODipine (NORVASC) 10 MG tablet Take 1 tablet (10 mg total) by mouth daily. 90 tablet 3  . fluticasone (FLONASE) 50 MCG/ACT nasal spray Place 2 sprays into both nostrils daily. 16 mL 5  . levocetirizine (XYZAL) 5 MG tablet Take 1 tablet (5 mg total) by mouth every evening. 30 tablet 3  . levothyroxine (SYNTHROID) 112 MCG tablet Take 1 tablet (112 mcg total) by mouth daily before breakfast. 90 tablet 1  . losartan (COZAAR) 100 MG tablet Take 1  tablet (100 mg total) by mouth daily. 90 tablet 3  . simvastatin (ZOCOR) 20 MG tablet TAKE 1 TABLET BY MOUTH EVERY DAY 30 tablet 0  . benzonatate (TESSALON) 100 MG capsule Take 1 capsule (100 mg total) by mouth 3 (three) times daily as needed for cough. 30 capsule 0  . losartan (COZAAR) 25 MG tablet TAKE 2 TABLETS BY MOUTH EVERY DAY 180 tablet 1  . sulfamethoxazole-trimethoprim (BACTRIM DS) 800-160 MG tablet Take 1 tablet by mouth 2 (two) times daily. 20 tablet 0   No current facility-administered medications on file prior to visit.    BP 138/87   Pulse 63   Temp 98.2 F (36.8 C) (Oral)   Resp 18   Ht  6\' 1"  (1.854 m)   Wt (!) 314 lb 9.6 oz (142.7 kg)   SpO2 100%   BMI 41.51 kg/m       Objective:   Physical Exam  General Mental Status- Alert. General Appearance- Not in acute distress.   Skin General: Color- Normal Color. Moisture- Normal Moisture.  Neck Carotid Arteries- Normal color. Moisture- Normal Moisture. No carotid bruits. No JVD.  Chest and Lung Exam Auscultation: Breath Sounds:-Normal.  Cardiovascular Auscultation:Rythm- Regular. Murmurs & Other Heart Sounds:Auscultation of the heart reveals- No Murmurs.  Abdomen Inspection:-Inspeection Normal. Palpation/Percussion:Note:No mass. Palpation and Percussion of the abdomen reveal- Non Tender, Non Distended + BS, no rebound or guarding.   Neurologic Cranial Nerve exam:- CN III-XII intact(No nystagmus), symmetric smile. Strength:- 5/5 equal and symmetric strength both upper and lower extremities.      Assessment & Plan:  Your blood pressure is decently controlled today. Would recommend that you continue losartan 100 mg daily and amlodipine 10 mg daily.  Obesity/ Weight playing role in elevated blood pressure.  Would recommend that you try weight watchers app.  Your back pain appear to be weight related and activity related. If pain returns and persists then can get xray of your lumbar spine.  History of sleep apnea. Offered referral but declined. So recommend weight loss presently.  Follow up in 3 months or as needed  [employer], [employer]

## 2020-10-13 NOTE — Telephone Encounter (Signed)
Downstairs does have Losartan

## 2020-10-13 NOTE — Telephone Encounter (Signed)
Pt.notified

## 2020-11-09 ENCOUNTER — Encounter: Payer: Self-pay | Admitting: Medical

## 2021-01-07 ENCOUNTER — Other Ambulatory Visit: Payer: Self-pay | Admitting: Medical

## 2021-01-22 ENCOUNTER — Other Ambulatory Visit: Payer: Self-pay | Admitting: Medical

## 2021-01-24 ENCOUNTER — Encounter: Payer: Self-pay | Admitting: Medical

## 2021-01-28 ENCOUNTER — Ambulatory Visit (INDEPENDENT_AMBULATORY_CARE_PROVIDER_SITE_OTHER): Payer: Medicare Other | Admitting: Medical

## 2021-01-28 ENCOUNTER — Other Ambulatory Visit: Payer: Self-pay

## 2021-01-28 VITALS — BP 155/100 | HR 68 | Resp 18 | Ht 73.0 in | Wt 305.8 lb

## 2021-01-28 DIAGNOSIS — R972 Elevated prostate specific antigen [PSA]: Secondary | ICD-10-CM | POA: Diagnosis not present

## 2021-01-28 DIAGNOSIS — I10 Essential (primary) hypertension: Secondary | ICD-10-CM

## 2021-01-28 DIAGNOSIS — R5383 Other fatigue: Secondary | ICD-10-CM | POA: Diagnosis not present

## 2021-01-28 DIAGNOSIS — E785 Hyperlipidemia, unspecified: Secondary | ICD-10-CM

## 2021-01-28 DIAGNOSIS — R739 Hyperglycemia, unspecified: Secondary | ICD-10-CM

## 2021-01-28 LAB — COMPREHENSIVE METABOLIC PANEL WITH GFR
ALT: 33 U/L (ref 0–53)
AST: 27 U/L (ref 0–37)
Albumin: 4.6 g/dL (ref 3.5–5.2)
Alkaline Phosphatase: 62 U/L (ref 39–117)
BUN: 15 mg/dL (ref 6–23)
CO2: 27 meq/L (ref 19–32)
Calcium: 9.3 mg/dL (ref 8.4–10.5)
Chloride: 104 meq/L (ref 96–112)
Creatinine, Ser: 1.03 mg/dL (ref 0.40–1.50)
GFR: 73.73 mL/min (ref >60.00)
Glucose, Bld: 110 mg/dL — ABNORMAL HIGH (ref 70–99)
Potassium: 4.5 meq/L (ref 3.5–5.1)
Sodium: 139 meq/L (ref 135–145)
Total Bilirubin: 0.8 mg/dL (ref 0.2–1.2)
Total Protein: 6.8 g/dL (ref 6.0–8.3)

## 2021-01-28 LAB — PSA: PSA: 1.72 ng/mL (ref 0.10–4.00)

## 2021-01-28 LAB — CBC WITH DIFFERENTIAL/PLATELET
Basophils Absolute: 0 10*3/uL (ref 0.0–0.1)
Basophils Relative: 0.5 % (ref 0.0–3.0)
Eosinophils Absolute: 0.4 10*3/uL (ref 0.0–0.7)
Eosinophils Relative: 6.6 % — ABNORMAL HIGH (ref 0.0–5.0)
HCT: 47.2 % (ref 39.0–52.0)
Hemoglobin: 16.1 g/dL (ref 13.0–17.0)
Lymphocytes Relative: 22.9 % (ref 12.0–46.0)
Lymphs Abs: 1.5 10*3/uL (ref 0.7–4.0)
MCHC: 34.1 g/dL (ref 30.0–36.0)
MCV: 92.6 fl (ref 78.0–100.0)
Monocytes Absolute: 0.7 10*3/uL (ref 0.1–1.0)
Monocytes Relative: 10.4 % (ref 3.0–12.0)
Neutro Abs: 3.8 10*3/uL (ref 1.4–7.7)
Neutrophils Relative %: 59.6 % (ref 43.0–77.0)
Platelets: 255 10*3/uL (ref 150.0–400.0)
RBC: 5.09 Mil/uL (ref 4.22–5.81)
RDW: 13 % (ref 11.5–15.5)
WBC: 6.4 10*3/uL (ref 4.0–10.5)

## 2021-01-28 LAB — HEMOGLOBIN A1C: Hgb A1c MFr Bld: 5.7 % (ref 4.6–6.5)

## 2021-01-28 LAB — TSH: TSH: 4.76 u[IU]/mL — ABNORMAL HIGH (ref 0.35–4.50)

## 2021-01-28 LAB — LIPID PANEL
Cholesterol: 128 mg/dL (ref 0–200)
HDL: 32.7 mg/dL — ABNORMAL LOW (ref >39.00)
LDL Cholesterol: 63 mg/dL (ref 0–99)
NonHDL: 95.72
Total CHOL/HDL Ratio: 4
Triglycerides: 164 mg/dL — ABNORMAL HIGH (ref 0.0–149.0)
VLDL: 32.8 mg/dL (ref 0.0–40.0)

## 2021-01-28 MED ORDER — CHLORTHALIDONE 25 MG PO TABS: 25.0000 mg | ORAL_TABLET | Freq: Every day | ORAL | 0 refills | Status: DC

## 2021-01-28 NOTE — Patient Instructions (Addendum)
Your bp is elevated and has been elevated recently. Continue losartan 100 mg daily. Will add chlorthalidone 25 mg daily. Rx advisement given. Make sure adequate potassium in diet.  For elevated sugar will get a1c and cmp today.  For high cholesterol will get lipid panel.  Hx of elevated psa. Psa today.  Some fatigue. Will get tsh and cbc.   Follow up 10-14 days nurse bp check.

## 2021-01-28 NOTE — Progress Notes (Signed)
Subjective:    Patient ID: Jackson French, male    DOB: Dec 09, 1950, 70 y.o.   MRN: [id number]  HPI  Pt in for follow up.  Pt states amlodipine made his legs swelling dramatically. Dr. Lorelei Pont got message. Pt stopped amlodipine. Pt bp 183/93 at times with his machine at time. Pt is still on losartan 100 mg daily.  This morning no symptoms with his bp at home.   Pt has not been on any other meds.  No gross motor or sensory function deficits.   Pt has lost 10 lbs purposeful weight loss.    Review of Systems  Constitutional:  Negative for chills, fatigue and fever.  Respiratory:  Negative for cough, shortness of breath and wheezing.   Cardiovascular:  Negative for chest pain and palpitations.  Gastrointestinal:  Negative for abdominal pain.  Genitourinary:  Negative for dysuria, flank pain and frequency.  Musculoskeletal:  Negative for back pain, myalgias and neck pain.  Hematological:  Negative for adenopathy.  Psychiatric/Behavioral:  Negative for behavioral problems, decreased concentration and hallucinations. The patient is not hyperactive.      Past Medical History:  Diagnosis Date   Gout    Hyperlipidemia    Hypertension    Thyroid disease      Social History   Socioeconomic History   Marital status: Married    Spouse name: Not on file   Number of children: Not on file   Years of education: Not on file   Highest education level: Not on file  Occupational History   Not on file  Tobacco Use   Smoking status: Never   Smokeless tobacco: Never  Substance and Sexual Activity   Alcohol use: Yes   Drug use: Never   Sexual activity: Yes  Other Topics Concern   Not on file  Social History Narrative   Not on file   Social Determinants of Health   Financial Resource Strain: Not on file  Food Insecurity: Not on file  Transportation Needs: Not on file  Physical Activity: Not on file  Stress: Not on file  Social Connections: Not on file  Intimate Partner  Violence: Not on file    Past Surgical History:  Procedure Laterality Date   APPENDECTOMY     HYDROCELE EXCISION / REPAIR     THYROID SURGERY     TONSILLECTOMY     VASECTOMY      Family History  Problem Relation Age of Onset   Arthritis Mother    Breast cancer Mother     No Known Allergies  Current Outpatient Medications on File Prior to Visit  Medication Sig Dispense Refill   amLODipine (NORVASC) 10 MG tablet Take 1 tablet (10 mg total) by mouth daily. 90 tablet 3   benzonatate (TESSALON) 100 MG capsule Take 1 capsule (100 mg total) by mouth 3 (three) times daily as needed for cough. 30 capsule 0   COVID-19 mRNA vaccine, Moderna, 100 MCG/0.5ML injection INJECT AS DIRECTED .25 mL 0   fluticasone (FLONASE) 50 MCG/ACT nasal spray Place 2 sprays into both nostrils daily. 16 mL 5   levocetirizine (XYZAL) 5 MG tablet Take 1 tablet (5 mg total) by mouth every evening. 90 tablet 1   levothyroxine (SYNTHROID) 112 MCG tablet TAKE 1 TABLET BY MOUTH DAILY BEFORE BREAKFAST. 90 tablet 1   losartan (COZAAR) 100 MG tablet TAKE 1 TABLET (100 MG TOTAL) BY MOUTH DAILY. 90 tablet 3   simvastatin (ZOCOR) 20 MG tablet Take 1 tablet (20 mg  total) by mouth daily. 90 tablet 1   sulfamethoxazole-trimethoprim (BACTRIM DS) 800-160 MG tablet Take 1 tablet by mouth 2 (two) times daily. 20 tablet 0   No current facility-administered medications on file prior to visit.    BP (!) 155/100   Pulse 68   Resp 18   Ht 6\' 1"  (1.854 m)   Wt (!) 305 lb 12.8 oz (138.7 kg)   SpO2 99%   BMI 40.35 kg/m       Objective:   Physical Exam    General Mental Status- Alert. General Appearance- Not in acute distress.   Skin General: Color- Normal Color. Moisture- Normal Moisture.  Neck Carotid Arteries- Normal color. Moisture- Normal Moisture. No carotid bruits. No JVD.  Chest and Lung Exam Auscultation: Breath Sounds:-Normal.  Cardiovascular Auscultation:Rythm- Regular. Murmurs & Other Heart  Sounds:Auscultation of the heart reveals- No Murmurs.  Abdomen Inspection:-Inspeection Normal. Palpation/Percussion:Note:No mass. Palpation and Percussion of the abdomen reveal- Non Tender, Non Distended + BS, no rebound or guarding.   Neurologic Cranial Nerve exam:- CN III-XII intact(No nystagmus), symmetric smile. Strength:- 5/5 equal and symmetric strength both upper and lower extremities.     Assessment & Plan:  Your bp is elevated and has been elevated recently. Continue losartan 100 mg daily. Will add chlorthalidone 25 mg daily. Rx advisement given. Make sure adequate potassium in diet.  For elevated sugar will get a1c and cmp today.  For high cholesterol will get lipid panel.   Follow up 10-14 days nurse bp check.   Mackie Pai, PA-C

## 2021-01-29 ENCOUNTER — Other Ambulatory Visit: Payer: Medicare Other

## 2021-01-29 ENCOUNTER — Other Ambulatory Visit (INDEPENDENT_AMBULATORY_CARE_PROVIDER_SITE_OTHER): Payer: Medicare Other

## 2021-01-29 DIAGNOSIS — R946 Abnormal results of thyroid function studies: Secondary | ICD-10-CM

## 2021-01-29 DIAGNOSIS — R7989 Other specified abnormal findings of blood chemistry: Secondary | ICD-10-CM

## 2021-01-29 LAB — T4, FREE: Free T4: 1.07 ng/dL (ref 0.60–1.60)

## 2021-02-09 ENCOUNTER — Other Ambulatory Visit: Payer: Self-pay

## 2021-02-09 ENCOUNTER — Ambulatory Visit (INDEPENDENT_AMBULATORY_CARE_PROVIDER_SITE_OTHER): Payer: Medicare Other

## 2021-02-09 VITALS — BP 104/77 | HR 95

## 2021-02-09 DIAGNOSIS — I10 Essential (primary) hypertension: Secondary | ICD-10-CM | POA: Diagnosis not present

## 2021-02-09 NOTE — Progress Notes (Addendum)
Pt here for Blood pressure check per Mackie Pai   He was advised to check BP with home monitor and compare. 150/60 , 128/88 range on bp machine at home this morning.   Pt currently takes: chlorthalidone 25 mg and lorsartan 100 mg. Patient has taken both medications this morning.    Pt reports compliance with medication.  BP today @ = 104/72  HR = 95  Rechecked BP in right arm. 104/76.    I reviewed and sent pt my chart response since he had questions. Asked him to continue to check bp daily and take current regimen. Follow up in one week for manual bp check with me. Asked him to bring his machine also as want to check his machine against my manual reading since big difference in our readings and his readings. Then make changes after review.  Mackie Pai, PA-C

## 2021-02-10 ENCOUNTER — Encounter: Payer: Self-pay | Admitting: Medical

## 2021-02-23 ENCOUNTER — Other Ambulatory Visit: Payer: Self-pay

## 2021-02-23 ENCOUNTER — Ambulatory Visit (INDEPENDENT_AMBULATORY_CARE_PROVIDER_SITE_OTHER): Payer: Medicare Other | Admitting: Family Medicine

## 2021-02-23 VITALS — BP 118/88 | HR 83

## 2021-02-23 DIAGNOSIS — I10 Essential (primary) hypertension: Secondary | ICD-10-CM

## 2021-02-23 MED ORDER — CHLORTHALIDONE 25 MG PO TABS: 25.0000 mg | ORAL_TABLET | Freq: Every day | ORAL | 0 refills | Status: DC

## 2021-02-23 NOTE — Progress Notes (Signed)
Pt here for Blood pressure check per Percell Miller Saguier  Pt currently takes: chlorthalidone 25mg  and losartan 100mg   Pt reports compliance with medication.  Patient brought in home readings  02/14/21- 128/82 02/15/21- 135/85 330pm 02/15/21 130/82 4pm 02/16/21 144/94  02/18/21 135/88 02/21/21 169/90 and 135/82 at recheck 02/23/21 169/93 and 145/88 at recheck  Last bp was 02/09/21 and was 104/77  BP today @ = 118/88 HR = 83  Patient machine reading BP: 125/88  HR: 82  Pt advised per Dr. Carollee Herter to continue same regimen and to follow up with Percell Miller in September.  Patient will call and set up appointment,  Advise given to patient to get new blood pressure machine with bigger cuff as his machine is out of date.  Also to make sure he rests about 10-15 minutes before checking blood pressure and to have arm elevated at heart level when checking.

## 2021-02-27 ENCOUNTER — Other Ambulatory Visit: Payer: Self-pay | Admitting: Medical

## 2021-03-21 ENCOUNTER — Encounter: Payer: Self-pay | Admitting: Medical

## 2021-03-22 MED ORDER — LOSARTAN POTASSIUM 100 MG PO TABS: ORAL_TABLET | Freq: Every day | ORAL | 3 refills | Status: DC

## 2021-03-23 ENCOUNTER — Encounter: Payer: Self-pay | Admitting: Medical

## 2021-03-24 NOTE — Telephone Encounter (Signed)
Ben from downstairs stated mag citrate is on back order world wide , they don't have any milk of magnesia , but he does have 1 bottle of mallox left and glycerin suppositories

## 2021-04-01 ENCOUNTER — Ambulatory Visit (INDEPENDENT_AMBULATORY_CARE_PROVIDER_SITE_OTHER): Payer: Medicare Other | Admitting: Medical

## 2021-04-01 ENCOUNTER — Ambulatory Visit (HOSPITAL_BASED_OUTPATIENT_CLINIC_OR_DEPARTMENT_OTHER)
Admission: RE | Admit: 2021-04-01 | Discharge: 2021-04-01 | Disposition: A | Discharge status: Home / Self Care | Payer: Medicare Other | Source: Ambulatory Visit | Attending: Medical | Admitting: Medical

## 2021-04-01 ENCOUNTER — Other Ambulatory Visit: Payer: Self-pay

## 2021-04-01 ENCOUNTER — Encounter: Payer: Self-pay | Admitting: Medical

## 2021-04-01 VITALS — BP 118/70 | HR 83 | Resp 18 | Ht 73.0 in | Wt 293.2 lb

## 2021-04-01 DIAGNOSIS — R1032 Left lower quadrant pain: Secondary | ICD-10-CM | POA: Diagnosis not present

## 2021-04-01 DIAGNOSIS — R1030 Lower abdominal pain, unspecified: Secondary | ICD-10-CM | POA: Insufficient documentation

## 2021-04-01 DIAGNOSIS — I10 Essential (primary) hypertension: Secondary | ICD-10-CM | POA: Diagnosis not present

## 2021-04-01 DIAGNOSIS — K5900 Constipation, unspecified: Secondary | ICD-10-CM | POA: Diagnosis not present

## 2021-04-01 DIAGNOSIS — K5909 Other constipation: Secondary | ICD-10-CM | POA: Diagnosis not present

## 2021-04-01 DIAGNOSIS — K921 Melena: Secondary | ICD-10-CM | POA: Diagnosis not present

## 2021-04-01 DIAGNOSIS — R634 Abnormal weight loss: Secondary | ICD-10-CM

## 2021-04-01 LAB — CBC WITH DIFFERENTIAL/PLATELET
Basophils Absolute: 0 10*3/uL (ref 0.0–0.1)
Basophils Relative: 0.8 % (ref 0.0–3.0)
Eosinophils Absolute: 0.2 10*3/uL (ref 0.0–0.7)
Eosinophils Relative: 3.3 % (ref 0.0–5.0)
HCT: 46.1 % (ref 39.0–52.0)
Hemoglobin: 16 g/dL (ref 13.0–17.0)
Lymphocytes Relative: 25.2 % (ref 12.0–46.0)
Lymphs Abs: 1.6 10*3/uL (ref 0.7–4.0)
MCHC: 34.7 g/dL (ref 30.0–36.0)
MCV: 91.6 fl (ref 78.0–100.0)
Monocytes Absolute: 0.6 10*3/uL (ref 0.1–1.0)
Monocytes Relative: 9.2 % (ref 3.0–12.0)
Neutro Abs: 3.9 10*3/uL (ref 1.4–7.7)
Neutrophils Relative %: 61.5 % (ref 43.0–77.0)
Platelets: 276 10*3/uL (ref 150.0–400.0)
RBC: 5.03 Mil/uL (ref 4.22–5.81)
RDW: 12.8 % (ref 11.5–15.5)
WBC: 6.4 10*3/uL (ref 4.0–10.5)

## 2021-04-01 LAB — COMPREHENSIVE METABOLIC PANEL
ALT: 36 U/L (ref 0–53)
AST: 29 U/L (ref 0–37)
Albumin: 4.5 g/dL (ref 3.5–5.2)
Alkaline Phosphatase: 58 U/L (ref 39–117)
BUN: 15 mg/dL (ref 6–23)
CO2: 32 mEq/L (ref 19–32)
Calcium: 9.7 mg/dL (ref 8.4–10.5)
Chloride: 98 mEq/L (ref 96–112)
Creatinine, Ser: 1.18 mg/dL (ref 0.40–1.50)
GFR: 62.56 mL/min (ref >60.00)
Glucose, Bld: 113 mg/dL — ABNORMAL HIGH (ref 70–99)
Potassium: 3.8 mEq/L (ref 3.5–5.1)
Sodium: 140 mEq/L (ref 135–145)
Total Bilirubin: 1 mg/dL (ref 0.2–1.2)
Total Protein: 6.9 g/dL (ref 6.0–8.3)

## 2021-04-01 LAB — LIPASE: Lipase: 43 U/L (ref 11.0–59.0)

## 2021-04-01 NOTE — Patient Instructions (Signed)
History of recent constipation occurring intermittently over the past 2 to 3 months.  This is a change in bowel pattern.  Occasional rare bright red blood in stool.  Some recent weight loss at 22 pounds with minimal effort.  Recommend hydrate well daily, try to get some mild exercise and eat healthy diet.  If more than 2 days passed any feeling constipated can try Dulcolax, MiraLAX or milk of magnesia.  Local magnesia options since magnesium citrate is on backorder/not available.  Will get CBC, CMP, lipase and 1 view abdomen x-ray.  With abdomen x-ray will be able to assess recent stool burden particularly since he still has some minimal left lower quadrant discomfort on exam.  Will follow labs and x-ray studies.  Notify us if you have recurrent constipation as you had last week or increasing discomfort.  Depending on how you do and depending on lab studies might consider referral to GI MD.  With recent weight loss will review labs and might go ahead and order CT studies.  Recent lower and blood pressure readings.  History of hypertension.  Want you to cut back your losartan dose to 50 mg daily daily.  Follow-up date to be determined after lab and imaging review.

## 2021-04-01 NOTE — Progress Notes (Signed)
Subjective:    Patient ID: Jackson French, male    DOB: 16-Sep-1950, 70 y.o.   MRN: [id number]  HPI  Pt in for some intermittent constipation recently. He is going on average now about very 3-4 days. Feels gasy. He states prior pattern was every 1-2 days.   Pt had sent message by mychart when I was not in office/at conference and I had given advise. See those notes.  Pt last bm was yesterday. Last bm was normal.   But at times small hard stool. Change in bowel pattern past 2-3 months.   Last colonosocpy was 15 years ago. Rare slight blood on paper or stool occasionally.   At age 45 yo had appendix removed.  Pt has not been doing formal exercise. Recent eating more fruits and vegaetables. He has recenlty cut out cheeze and stopped milk.    Pt states he has lost about 22 pounds. He states eating less but not reallly making effort.  Recently mild dizziness when bending over.     Review of Systems  Constitutional:  Negative for appetite change, diaphoresis, fatigue and unexpected weight change.  Respiratory:  Negative for cough, chest tightness, shortness of breath and wheezing.   Cardiovascular:  Negative for chest pain and palpitations.  Gastrointestinal:  Negative for abdominal pain and blood in stool.  Genitourinary:  Negative for dysuria and frequency.  Musculoskeletal:  Negative for back pain.  Skin:  Negative for rash.  Neurological:  Negative for facial asymmetry, speech difficulty, weakness and light-headedness.  Psychiatric/Behavioral:  Negative for behavioral problems, decreased concentration and hallucinations. The patient is not nervous/anxious.      Past Medical History:  Diagnosis Date   Gout    Hyperlipidemia    Hypertension    Thyroid disease      Social History   Socioeconomic History   Marital status: Married    Spouse name: Not on file   Number of children: Not on file   Years of education: Not on file   Highest education level: Not on file   Occupational History   Not on file  Tobacco Use   Smoking status: Never   Smokeless tobacco: Never  Substance and Sexual Activity   Alcohol use: Yes   Drug use: Never   Sexual activity: Yes  Other Topics Concern   Not on file  Social History Narrative   Not on file   Social Determinants of Health   Financial Resource Strain: Not on file  Food Insecurity: Not on file  Transportation Needs: Not on file  Physical Activity: Not on file  Stress: Not on file  Social Connections: Not on file  Intimate Partner Violence: Not on file    Past Surgical History:  Procedure Laterality Date   APPENDECTOMY     HYDROCELE EXCISION / REPAIR     THYROID SURGERY     TONSILLECTOMY     VASECTOMY      Family History  Problem Relation Age of Onset   Arthritis Mother    Breast cancer Mother     No Known Allergies  Current Outpatient Medications on File Prior to Visit  Medication Sig Dispense Refill   chlorthalidone (HYGROTON) 25 MG tablet Take 1 tablet (25 mg total) by mouth daily. 90 tablet 0   fluticasone (FLONASE) 50 MCG/ACT nasal spray SPRAY 2 SPRAYS INTO EACH NOSTRIL EVERY DAY 48 mL 1   levocetirizine (XYZAL) 5 MG tablet Take 1 tablet (5 mg total) by mouth every evening. 90 tablet  1   levothyroxine (SYNTHROID) 112 MCG tablet TAKE 1 TABLET BY MOUTH DAILY BEFORE BREAKFAST. 90 tablet 1   losartan (COZAAR) 100 MG tablet TAKE 1 TABLET (100 MG TOTAL) BY MOUTH DAILY. 90 tablet 3   simvastatin (ZOCOR) 20 MG tablet Take 1 tablet (20 mg total) by mouth daily. 90 tablet 1   COVID-19 mRNA vaccine, Moderna, 100 MCG/0.5ML injection INJECT AS DIRECTED (Patient not taking: Reported on 04/01/2021) .25 mL 0   No current facility-administered medications on file prior to visit.    BP 118/80 (BP Location: Left Arm, Patient Position: Sitting, Cuff Size: Large)   Pulse 83   Resp 18   Ht '6\' 1"'$  (1.854 m)   Wt 293 lb 3.2 oz (133 kg)   SpO2 99%   BMI 38.68 kg/m        Objective:   Physical  Exam  General Mental Status- Alert. General Appearance- Not in acute distress.   Skin General: Color- Normal Color. Moisture- Normal Moisture.  Neck Carotid Arteries- Normal color. Moisture- Normal Moisture. No carotid bruits. No JVD.  Chest and Lung Exam Auscultation: Breath Sounds:-Norma. CTA.  Cardiovascular Auscultation:Rythm- Regular. Murmurs & Other Heart Sounds:Auscultation of the heart reveals- No Murmurs.  Abdomen Inspection:-Inspeection Normal. Palpation/Percussion:Note:No mass. Palpation and Percussion of the abdomen reveal- only faint left lower quadrant Tender, Non Distended + BS, no rebound or guarding.   Neurologic Cranial Nerve exam:- CN III-XII intact(No nystagmus), symmetric smile. Strength:- 5/5 equal and symmetric strength both upper and lower extremities.       Assessment & Plan:  History of recent constipation occurring intermittently over the past 2 to 3 months.  This is a change in bowel pattern.  Occasional rare bright red blood in stool.  Some recent weight loss at 22 pounds with minimal effort.  Recommend hydrate well daily, try to get some mild exercise and eat healthy diet.  If more than 2 days passed any feeling constipated can try Dulcolax, MiraLAX or milk of magnesia.  Local magnesia options since magnesium citrate is on backorder/not available.  Will get CBC, CMP, lipase and 1 view abdomen x-ray.  With abdomen x-ray will be able to assess recent stool burden particularly since he still has some minimal left lower quadrant discomfort on exam.  Will follow labs and x-ray studies.  Notify us if you have recurrent constipation as you had last week or increasing discomfort.  Depending on how you do and depending on lab studies might consider referral to GI MD.  With recent weight loss will review labs and might go ahead and order CT studies.  Recent lower and blood pressure readings.  History of hypertension.  Want you to cut back your losartan  dose to 50 mg daily daily.  Follow-up date to be determined after lab and imaging review.  Mackie Pai, PA-C

## 2021-04-02 ENCOUNTER — Telehealth: Payer: Self-pay

## 2021-04-02 ENCOUNTER — Other Ambulatory Visit (INDEPENDENT_AMBULATORY_CARE_PROVIDER_SITE_OTHER): Payer: Medicare Other

## 2021-04-02 ENCOUNTER — Other Ambulatory Visit: Payer: Self-pay | Admitting: Medical

## 2021-04-02 DIAGNOSIS — R634 Abnormal weight loss: Secondary | ICD-10-CM | POA: Diagnosis not present

## 2021-04-02 DIAGNOSIS — R1030 Lower abdominal pain, unspecified: Secondary | ICD-10-CM

## 2021-04-02 LAB — FECAL OCCULT BLOOD, IMMUNOCHEMICAL: Fecal Occult Bld: POSITIVE — AB

## 2021-04-02 NOTE — Telephone Encounter (Signed)
Pos ifob

## 2021-04-02 NOTE — Telephone Encounter (Signed)
Positive Ifob  Caller: Marshall Cork  Receiver: Manuela Schwartz Date and time:  04/02/2021 at 2:42 pm

## 2021-04-02 NOTE — Addendum Note (Signed)
Addended by: Kelle Darting A on: 04/02/2021 08:02 AM   Modules accepted: Orders

## 2021-04-03 NOTE — Addendum Note (Signed)
Addended by: Anabel Halon on: 04/03/2021 10:27 AM   Modules accepted: Orders

## 2021-04-07 ENCOUNTER — Encounter: Payer: Self-pay | Admitting: Medical

## 2021-04-13 ENCOUNTER — Encounter: Payer: Self-pay | Admitting: Internal Medicine

## 2021-04-13 ENCOUNTER — Ambulatory Visit (INDEPENDENT_AMBULATORY_CARE_PROVIDER_SITE_OTHER): Payer: Medicare Other | Admitting: Internal Medicine

## 2021-04-13 VITALS — BP 118/88 | HR 99 | Ht 73.0 in | Wt 294.0 lb

## 2021-04-13 DIAGNOSIS — Z800 Family history of malignant neoplasm of digestive organs: Secondary | ICD-10-CM | POA: Diagnosis not present

## 2021-04-13 DIAGNOSIS — R634 Abnormal weight loss: Secondary | ICD-10-CM | POA: Diagnosis not present

## 2021-04-13 DIAGNOSIS — K5900 Constipation, unspecified: Secondary | ICD-10-CM

## 2021-04-13 DIAGNOSIS — R195 Other fecal abnormalities: Secondary | ICD-10-CM

## 2021-04-13 DIAGNOSIS — R198 Other specified symptoms and signs involving the digestive system and abdomen: Secondary | ICD-10-CM | POA: Diagnosis not present

## 2021-04-13 MED ORDER — PLENVU 140 G PO SOLR: 1.0000 | ORAL | 0 refills | Status: DC

## 2021-04-13 NOTE — Progress Notes (Signed)
Patient ID: Jackson French, male   DOB: 05/01/1951, 70 y.o.   MRN: [id number] HPI: Jackson French is a 70 year old male with a history of hypertension, hyperlipidemia sleep apnea, hypothyroidism who is seen in consult at the request of Mackie Pai, PAC to evaluate change in bowel habit, heme positive stool as well as unexplained weight loss.  Patient is here alone today.  He reports that over the last 3 months or so he has developed a change in bowel habit with more frequent constipation.  This has been associated with abdominal gas and bloating as well as intermittent left lower quadrant pain.  He has nausea when he is constipated.  Prior to starting the bowel habit changes as above he would have fairly normal bowel movements every 2 or 3 days which felt complete.  He would infrequently have constipation over the years.  However over the last 2 or 3 months he is felt more constipated on a regular basis.  Stools would occur every 2 to 3 days but be small and incomplete.  On one occasion he saw a small amount of red blood on stool.  His left lower abdominal pain would be most present prior to bowel movement.  He tried several days of MiraLAX without much relief.  He tried to get magnesium citrate but this was unavailable and so he used Colace and milk of magnesia on several occasions.  He has noticed a 25 pound unexplained weight loss in the last 3 months.  Appetite has been decreased.  Nausea again with constipation but no vomiting.  He does not have early satiety.  He had a colonoscopy 15 years ago in [place] which was normal.  His father had stomach/duodenal cancer and this was treated by Whipple operation.  He died of heart attack.  Past Medical History:  Diagnosis Date   Gout    Hyperlipidemia    Hypertension    Sleep apnea    Thyroid disease     Past Surgical History:  Procedure Laterality Date   APPENDECTOMY     CATARACT EXTRACTION, BILATERAL     HYDROCELE EXCISION /  REPAIR     THYROID SURGERY     TONSILLECTOMY     VASECTOMY      Outpatient Medications Prior to Visit  Medication Sig Dispense Refill   chlorthalidone (HYGROTON) 25 MG tablet Take 1 tablet (25 mg total) by mouth daily. 90 tablet 0   fluticasone (FLONASE) 50 MCG/ACT nasal spray SPRAY 2 SPRAYS INTO EACH NOSTRIL EVERY DAY (Patient taking differently: as needed.) 48 mL 1   levothyroxine (SYNTHROID) 112 MCG tablet TAKE 1 TABLET BY MOUTH DAILY BEFORE BREAKFAST. 90 tablet 1   losartan (COZAAR) 100 MG tablet TAKE 1 TABLET (100 MG TOTAL) BY MOUTH DAILY. 90 tablet 3   simvastatin (ZOCOR) 20 MG tablet TAKE 1 TABLET BY MOUTH EVERY DAY 90 tablet 1   levocetirizine (XYZAL) 5 MG tablet Take 1 tablet (5 mg total) by mouth every evening. (Patient taking differently: Take 5 mg by mouth at bedtime as needed.) 90 tablet 1   COVID-19 mRNA vaccine, Moderna, 100 MCG/0.5ML injection INJECT AS DIRECTED (Patient not taking: Reported on 04/01/2021) .25 mL 0   No facility-administered medications prior to visit.    No Known Allergies  Family History  Problem Relation Age of Onset   Arthritis Mother    Breast cancer Mother    Stomach cancer Father 26       duodenal cancer   Heart attack  Father    Cancer - Colon Neg Hx     Social History   Tobacco Use   Smoking status: Never   Smokeless tobacco: Never  Vaping Use   Vaping Use: Never used  Substance Use Topics   Alcohol use: Yes    Comment: rarely   Drug use: Never    ROS: As per history of present illness, otherwise negative  BP 118/88   Pulse 99   Ht '6\' 1"'$  (1.854 m)   Wt 294 lb (133.4 kg)   SpO2 98%   BMI 38.79 kg/m  Gen: awake, alert, NAD HEENT: anicteric,  CV: RRR, no mrg Pulm: CTA b/l Abd: soft, NT/ND, +BS throughout Ext: no c/c/e Neuro: nonfocal   RELEVANT LABS AND IMAGING: CBC    Component Value Date/Time   WBC 6.4 04/01/2021 1004   RBC 5.03 04/01/2021 1004   HGB 16.0 04/01/2021 1004   HCT 46.1 04/01/2021 1004   PLT  276.0 04/01/2021 1004   MCV 91.6 04/01/2021 1004   MCHC 34.7 04/01/2021 1004   RDW 12.8 04/01/2021 1004   LYMPHSABS 1.6 04/01/2021 1004   MONOABS 0.6 04/01/2021 1004   EOSABS 0.2 04/01/2021 1004   BASOSABS 0.0 04/01/2021 1004    CMP     Component Value Date/Time   NA 140 04/01/2021 1004   K 3.8 04/01/2021 1004   CL 98 04/01/2021 1004   CO2 32 04/01/2021 1004   GLUCOSE 113 (H) 04/01/2021 1004   BUN 15 04/01/2021 1004   CREATININE 1.18 04/01/2021 1004   CALCIUM 9.7 04/01/2021 1004   PROT 6.9 04/01/2021 1004   ALBUMIN 4.5 04/01/2021 1004   AST 29 04/01/2021 1004   ALT 36 04/01/2021 1004   ALKPHOS 58 04/01/2021 1004   BILITOT 1.0 04/01/2021 1004   FOBT +   CLINICAL DATA:  Intermittent constipation, left lower quadrant pain   EXAM: ABDOMEN - 1 VIEW   COMPARISON:  None.   FINDINGS: There is a nonobstructive bowel gas pattern. There is a moderate to large stool burden throughout the colon, particularly over lower quadrant. There is no gross organomegaly or abnormal soft tissue calcification.   The bones are unremarkable.   IMPRESSION: Moderate stool burden throughout the colon particularly in the right lower quadrant.     Electronically Signed   By: Valetta Mole M.D.   On: 04/01/2021 10:50     ASSESSMENT/PLAN: 70 year old male with a history of hypertension, hyperlipidemia sleep apnea, hypothyroidism who is seen in consult at the request of Mackie Pai, PAC to evaluate change in bowel habit, heme positive stool as well as unexplained weight loss.    Change in bowel habits/constipation/heme positive stool/unexplained weight loss --colonoscopy recommended to evaluate change in bowel habits plus heme positive stool.  Have also recommended upper endoscopy given his weight loss and family history of gastric versus duodenal cancer in his father.  We discussed the risk, benefits and alternatives and he is agreeable and wishes to proceed --EGD and colonoscopy in the  [place] --MiraLAX 17 g daily  2.  Family history of gastric cancer/weight loss --EGD as above      [id number], [place], [place],  [place] [id number]

## 2021-04-13 NOTE — Patient Instructions (Signed)
Start: Miralax - dissolve 17g( 1 capful) in at least 8 ounces of water/juice daily.   You have been scheduled for a colonoscopy. Please follow written instructions given to you at your visit today.  Please pick up your prep supplies at the pharmacy within the next 1-3 days. If you use inhalers (even only as needed), please bring them with you on the day of your procedure.  We have sent the following medications to your pharmacy for you to pick up at your convenience: Plenvu  If you are age 39 or older, your body mass index should be between 23-30. Your Body mass index is 38.79 kg/m. If this is out of the aforementioned range listed, please consider follow up with your Primary Care Provider.  The [facility] providers would like to encourage you to use [facility] to communicate with providers for non-urgent requests or questions.  Due to long hold times on the telephone, sending your provider a message by [facility] may be a faster and more efficient way to get a response.  Please allow 48 business hours for a response.  Please remember that this is for non-urgent requests.    Thank you for choosing me and [facility].  Dr. Ulice Dash Pyrtle

## 2021-04-23 ENCOUNTER — Ambulatory Visit (AMBULATORY_SURGERY_CENTER): Payer: Medicare Other | Admitting: Internal Medicine

## 2021-04-23 ENCOUNTER — Encounter: Payer: Self-pay | Admitting: Internal Medicine

## 2021-04-23 ENCOUNTER — Other Ambulatory Visit: Payer: Self-pay

## 2021-04-23 VITALS — BP 119/81 | HR 80 | Temp 97.2°F | Resp 20 | Ht 76.0 in | Wt 294.0 lb

## 2021-04-23 DIAGNOSIS — R634 Abnormal weight loss: Secondary | ICD-10-CM

## 2021-04-23 DIAGNOSIS — D122 Benign neoplasm of ascending colon: Secondary | ICD-10-CM | POA: Diagnosis not present

## 2021-04-23 DIAGNOSIS — D123 Benign neoplasm of transverse colon: Secondary | ICD-10-CM | POA: Diagnosis not present

## 2021-04-23 DIAGNOSIS — R195 Other fecal abnormalities: Secondary | ICD-10-CM

## 2021-04-23 DIAGNOSIS — K2970 Gastritis, unspecified, without bleeding: Secondary | ICD-10-CM | POA: Diagnosis not present

## 2021-04-23 DIAGNOSIS — D124 Benign neoplasm of descending colon: Secondary | ICD-10-CM | POA: Diagnosis not present

## 2021-04-23 DIAGNOSIS — Z800 Family history of malignant neoplasm of digestive organs: Secondary | ICD-10-CM | POA: Diagnosis not present

## 2021-04-23 DIAGNOSIS — K2100 Gastro-esophageal reflux disease with esophagitis, without bleeding: Secondary | ICD-10-CM

## 2021-04-23 DIAGNOSIS — K648 Other hemorrhoids: Secondary | ICD-10-CM | POA: Diagnosis not present

## 2021-04-23 DIAGNOSIS — D120 Benign neoplasm of cecum: Secondary | ICD-10-CM | POA: Diagnosis not present

## 2021-04-23 DIAGNOSIS — D125 Benign neoplasm of sigmoid colon: Secondary | ICD-10-CM | POA: Diagnosis not present

## 2021-04-23 DIAGNOSIS — G4733 Obstructive sleep apnea (adult) (pediatric): Secondary | ICD-10-CM | POA: Diagnosis not present

## 2021-04-23 DIAGNOSIS — R194 Change in bowel habit: Secondary | ICD-10-CM

## 2021-04-23 MED ORDER — PANTOPRAZOLE SODIUM 40 MG PO TBEC: 40.0000 mg | DELAYED_RELEASE_TABLET | Freq: Every day | ORAL | 3 refills | Status: DC

## 2021-04-23 MED ORDER — SODIUM CHLORIDE 0.9 % IV SOLN: 500.0000 mL | INTRAVENOUS | Status: DC

## 2021-04-23 NOTE — Progress Notes (Signed)
Vitals not coming over

## 2021-04-23 NOTE — Progress Notes (Signed)
Called to room to assist during endoscopic procedure.  Patient ID and intended procedure confirmed with present staff. Received instructions for my participation in the procedure from the performing physician.  

## 2021-04-23 NOTE — Op Note (Signed)
[facility] Patient Name: Jackson French Procedure Date: 04/23/2021 8:49 AM MRN: [id number] Endoscopist: Jerene Bears , MD Age: 70 Referring MD:  Date of Birth: 08/05/1951 Gender: Male Account #: [id number] Procedure:                Colonoscopy Indications:              Heme positive stool, Weight loss, Incidental change                            in bowel habits noted Medicines:                Monitored Anesthesia Care Procedure:                Pre-Anesthesia Assessment:                           - Prior to the procedure, a History and Physical                            was performed, and patient medications and                            allergies were reviewed. The patient's tolerance of                            previous anesthesia was also reviewed. The risks                            and benefits of the procedure and the sedation                            options and risks were discussed with the patient.                            All questions were answered, and informed consent                            was obtained. Prior Anticoagulants: The patient has                            taken no previous anticoagulant or antiplatelet                            agents. ASA Grade Assessment: III - A patient with                            severe systemic disease. After reviewing the risks                            and benefits, the patient was deemed in                            satisfactory condition to undergo the procedure.  After obtaining informed consent, the colonoscope                            was passed under direct vision. Throughout the                            procedure, the patient's blood pressure, pulse, and                            oxygen saturations were monitored continuously. The                            CF [id number] was introduced through the anus                            and advanced to the cecum,  identified by                            appendiceal orifice and ileocecal valve. The                            colonoscopy was somewhat difficult due to a                            redundant colon. The patient tolerated the                            procedure well. The quality of the bowel                            preparation was good. The ileocecal valve,                            appendiceal orifice, and rectum were photographed. Scope In: 8:57:44 AM Scope Out: 9:35:23 AM Scope Withdrawal Time: 0 hours 32 minutes 39 seconds  Total Procedure Duration: 0 hours 37 minutes 39 seconds  Findings:                 The digital rectal exam was normal.                           A 7 mm polyp was found in the cecum. The polyp was                            sessile. The polyp was removed with a cold snare.                            Resection and retrieval were complete.                           A 4 mm polyp was found in the ileocecal valve. The                            polyp was  sessile. The polyp was removed with a                            cold snare. Resection and retrieval were complete.                           Seven sessile polyps were found in the ascending                            colon. The polyps were 4 to 9 mm in size. These                            polyps were removed with a cold snare. Resection                            and retrieval were complete.                           A 5 mm polyp was found in the transverse colon. The                            polyp was sessile. The polyp was removed with a                            cold snare. Resection and retrieval were complete.                           Two pedunculated and semi-pedunculated polyps were                            found in the descending colon. The polyps were 12                            to 18 mm in size. These polyps were removed with a                            hot snare. Resection and retrieval were  complete.                           A 5 mm polyp was found in the sigmoid colon. The                            polyp was sessile. The polyp was removed with a                            cold snare. Resection and retrieval were complete.                           Internal hemorrhoids were found during                            retroflexion. The hemorrhoids were small. Complications:  No immediate complications. Estimated Blood Loss:     Estimated blood loss was minimal. Impression:               - One 7 mm polyp in the cecum, removed with a cold                            snare. Resected and retrieved.                           - One 4 mm polyp at the ileocecal valve, removed                            with a cold snare. Resected and retrieved.                           - Seven 4 to 9 mm polyps in the ascending colon,                            removed with a cold snare. Resected and retrieved.                           - One 5 mm polyp in the transverse colon, removed                            with a cold snare. Resected and retrieved.                           - Two 12 to 18 mm polyps in the descending colon,                            removed with a hot snare. Resected and retrieved.                           - One 5 mm polyp in the sigmoid colon, removed with                            a cold snare. Resected and retrieved.                           - Internal hemorrhoids. Recommendation:           - Patient has a contact number available for                            emergencies. The signs and symptoms of potential                            delayed complications were discussed with the                            patient. Return to normal activities tomorrow.  Written discharge instructions were provided to the                            patient.                           - Resume previous diet.                           - Continue present  medications.                           - No aspirin, ibuprofen, naproxen, or other                            non-steroidal anti-inflammatory drugs for 2 weeks                            after polyp removal.                           - Await pathology results.                           - After pathology results from EGD/colon if no                            explanation for weight loss then CT scan abd/pelvis                            is recommended.                           - Repeat colonoscopy is recommended for                            surveillance of multiple polyps. The colonoscopy                            date will be determined after pathology results                            from today's exam become available for review. Jerene Bears, MD 04/23/2021 9:43:51 AM This report has been signed electronically.

## 2021-04-23 NOTE — Progress Notes (Signed)
Report to PACU, RN, vss, BBS= Clear.  

## 2021-04-23 NOTE — Progress Notes (Signed)
See office note from 04/13/2021 No changes to medical history since this time No complaints today including no chest pain, shortness of breath or abdominal pain. Patient remains appropriate for upper endoscopy and colonoscopy in a ambulatory setting.

## 2021-04-23 NOTE — Op Note (Signed)
[place] Patient Name: Jackson French Procedure Date: 04/23/2021 8:49 AM MRN: [id number] Endoscopist: Jerene Bears , MD Age: 70 Referring MD:  Date of Birth: [birth year] Gender: Male Account #: [id number] Procedure:                Upper GI endoscopy Indications:              Heme positive stool, Weight loss, Family history of                            gastric cancer Medicines:                Monitored Anesthesia Care Procedure:                Pre-Anesthesia Assessment:                           - Prior to the procedure, a History and Physical                            was performed, and patient medications and                            allergies were reviewed. The patient's tolerance of                            previous anesthesia was also reviewed. The risks                            and benefits of the procedure and the sedation                            options and risks were discussed with the patient.                            All questions were answered, and informed consent                            was obtained. Prior Anticoagulants: The patient has                            taken no previous anticoagulant or antiplatelet                            agents. ASA Grade Assessment: III - A patient with                            severe systemic disease. After reviewing the risks                            and benefits, the patient was deemed in                            satisfactory condition to undergo the procedure.  After obtaining informed consent, the endoscope was                            passed under direct vision. Throughout the                            procedure, the patient's blood pressure, pulse, and                            oxygen saturations were monitored continuously. The                            GIF [id number] was introduced through the                            mouth, and advanced to the second part of  duodenum.                            The upper GI endoscopy was accomplished without                            difficulty. The patient tolerated the procedure                            well. Scope In: Scope Out: Findings:                 LA Grade A (one or more mucosal breaks less than 5                            mm, not extending between tops of 2 mucosal folds)                            esophagitis with no bleeding was found at the                            gastroesophageal junction.                           Patchy minimal inflammation characterized by                            erythema was found in the gastric antrum. Biopsies                            were taken with a cold forceps for histology and                            Helicobacter pylori testing.                           The cardia and gastric fundus were normal on                            retroflexion.  The examined duodenum was normal. Complications:            No immediate complications. Estimated Blood Loss:     Estimated blood loss was minimal. Impression:               - Mild acid reflux esophagitis with no bleeding.                           - Gastritis. Biopsied.                           - Normal examined duodenum. Recommendation:           - Patient has a contact number available for                            emergencies. The signs and symptoms of potential                            delayed complications were discussed with the                            patient. Return to normal activities tomorrow.                            Written discharge instructions were provided to the                            patient.                           - Resume previous diet.                           - Continue present medications.                           - Await pathology results.                           - See the other procedure note for documentation of                             additional recommendations. Jerene Bears, MD 04/23/2021 9:39:31 AM This report has been signed electronically.

## 2021-04-23 NOTE — Progress Notes (Signed)
VS- Jackson French 

## 2021-04-23 NOTE — Patient Instructions (Signed)
Handouts provided on esophagitis, gastritis, polyps and hemorrhoids.   No aspirin, ibuprofen, naproxen, or other non-steriodal anti-inflammatory drugs (NSAIDs) for 2 weeks after polyp removal. You may use Tylenol if needed for mild pain or fever.    YOU HAD AN ENDOSCOPIC PROCEDURE TODAY AT [facility]:   Refer to the procedure report that was given to you for any specific questions about what was found during the examination.  If the procedure report does not answer your questions, please call your gastroenterologist to clarify.  If you requested that your care partner not be given the details of your procedure findings, then the procedure report has been included in a sealed envelope for you to review at your convenience later.  YOU SHOULD EXPECT: Some feelings of bloating in the abdomen. Passage of more gas than usual.  Walking can help get rid of the air that was put into your GI tract during the procedure and reduce the bloating. If you had a lower endoscopy (such as a colonoscopy or flexible sigmoidoscopy) you may notice spotting of blood in your stool or on the toilet paper. If you underwent a bowel prep for your procedure, you may not have a normal bowel movement for a few days.  Please Note:  You might notice some irritation and congestion in your nose or some drainage.  This is from the oxygen used during your procedure.  There is no need for concern and it should clear up in a day or so.  SYMPTOMS TO REPORT IMMEDIATELY:  Following lower endoscopy (colonoscopy or flexible sigmoidoscopy):  Excessive amounts of blood in the stool  Significant tenderness or worsening of abdominal pains  Swelling of the abdomen that is new, acute  Fever of 100F or higher  Following upper endoscopy (EGD)  Vomiting of blood or coffee ground material  New chest pain or pain under the shoulder blades  Painful or persistently difficult swallowing  New shortness of breath  Fever of 100F or  higher  Black, tarry-looking stools  For urgent or emergent issues, a gastroenterologist can be reached at any hour by calling [phone]. Do not use MyChart messaging for urgent concerns.    DIET:  We do recommend a small meal at first, but then you may proceed to your regular diet.  Drink plenty of fluids but you should avoid alcoholic beverages for 24 hours.  ACTIVITY:  You should plan to take it easy for the rest of today and you should NOT DRIVE or use heavy machinery until tomorrow (because of the sedation medicines used during the test).    FOLLOW UP: Our staff will call the number listed on your records 48-72 hours following your procedure to check on you and address any questions or concerns that you may have regarding the information given to you following your procedure. If we do not reach you, we will leave a message.  We will attempt to reach you two times.  During this call, we will ask if you have developed any symptoms of COVID 19. If you develop any symptoms (ie: fever, flu-like symptoms, shortness of breath, cough etc.) before then, please call [phone].  If you test positive for Covid 19 in the 2 weeks post procedure, please call and report this information to [place].    If any biopsies were taken you will be contacted by phone or by letter within the next 1-3 weeks.  Please call us at [phone] if you have not heard about the  biopsies in 3 weeks.    SIGNATURES/CONFIDENTIALITY: You and/or your care partner have signed paperwork which will be entered into your electronic medical record.  These signatures attest to the fact that that the information above on your After Visit Summary has been reviewed and is understood.  Full responsibility of the confidentiality of this discharge information lies with you and/or your care-partner.

## 2021-04-27 ENCOUNTER — Telehealth: Payer: Self-pay | Admitting: *Deleted

## 2021-04-27 NOTE — Telephone Encounter (Signed)
  Follow up Call-  Call back number 04/23/2021  Post procedure Call Back phone  # [phone]  Permission to leave phone message Yes  Some recent data might be hidden     Patient questions:  Do you have a fever, pain , or abdominal swelling? No. Pain Score  0 *  Have you tolerated food without any problems? Yes.    Have you been able to return to your normal activities? Yes.    Do you have any questions about your discharge instructions: Diet   No. Medications  No. Follow up visit  No.  Do you have questions or concerns about your Care? No.  Actions: * If pain score is 4 or above: No action needed, pain <4.  Have you developed a fever since your procedure? no  2.   Have you had an respiratory symptoms (SOB or cough) since your procedure? no  3.   Have you tested positive for COVID 19 since your procedure no  4.   Have you had any family members/close contacts diagnosed with the COVID 19 since your procedure?  no   If yes to any of these questions please route to Joylene John, RN and Joella Prince, RN

## 2021-04-28 ENCOUNTER — Encounter: Payer: Self-pay | Admitting: Internal Medicine

## 2021-05-16 ENCOUNTER — Other Ambulatory Visit: Payer: Self-pay | Admitting: Medical

## 2021-05-19 ENCOUNTER — Encounter: Payer: Self-pay | Admitting: Medical

## 2021-05-20 ENCOUNTER — Other Ambulatory Visit (HOSPITAL_BASED_OUTPATIENT_CLINIC_OR_DEPARTMENT_OTHER): Payer: Self-pay

## 2021-05-20 ENCOUNTER — Ambulatory Visit: Payer: Medicare Other | Attending: Internal Medicine

## 2021-05-20 DIAGNOSIS — Z23 Encounter for immunization: Secondary | ICD-10-CM

## 2021-05-20 MED ORDER — INFLUENZA VAC A&B SA ADJ QUAD 0.5 ML IM PRSY
PREFILLED_SYRINGE | INTRAMUSCULAR | 0 refills | Status: DC
  Filled 2021-05-20: qty 0.5, 1d supply, fill #0

## 2021-05-20 NOTE — Progress Notes (Signed)
   Covid-19 Vaccination Clinic  Name:  Malacki Mcphearson    MRN: [id number] DOB: Mar 30, 1951  05/20/2021  Mr. Lando was observed post Covid-19 immunization for 15 minutes without incident. He was provided with Vaccine Information Sheet and instruction to access the V-Safe system.   Mr. Helfman was instructed to call 911 with any severe reactions post vaccine: Difficulty breathing  Swelling of face and throat  A fast heartbeat  A bad rash all over body  Dizziness and weakness

## 2021-05-25 ENCOUNTER — Other Ambulatory Visit: Payer: Self-pay | Admitting: Medical

## 2021-05-25 ENCOUNTER — Ambulatory Visit (HOSPITAL_BASED_OUTPATIENT_CLINIC_OR_DEPARTMENT_OTHER)
Admission: RE | Admit: 2021-05-25 | Discharge: 2021-05-25 | Disposition: A | Discharge status: Home / Self Care | Payer: Medicare Other | Source: Ambulatory Visit | Attending: Medical | Admitting: Medical

## 2021-05-25 ENCOUNTER — Ambulatory Visit (INDEPENDENT_AMBULATORY_CARE_PROVIDER_SITE_OTHER): Payer: Medicare Other | Admitting: Medical

## 2021-05-25 ENCOUNTER — Other Ambulatory Visit: Payer: Self-pay

## 2021-05-25 VITALS — BP 112/70 | HR 86 | Temp 97.8°F | Resp 18 | Ht 76.0 in | Wt 299.0 lb

## 2021-05-25 DIAGNOSIS — J40 Bronchitis, not specified as acute or chronic: Secondary | ICD-10-CM

## 2021-05-25 DIAGNOSIS — R051 Acute cough: Secondary | ICD-10-CM | POA: Diagnosis not present

## 2021-05-25 DIAGNOSIS — R0600 Dyspnea, unspecified: Secondary | ICD-10-CM | POA: Diagnosis not present

## 2021-05-25 DIAGNOSIS — J9811 Atelectasis: Secondary | ICD-10-CM | POA: Diagnosis not present

## 2021-05-25 DIAGNOSIS — R062 Wheezing: Secondary | ICD-10-CM | POA: Diagnosis not present

## 2021-05-25 DIAGNOSIS — L723 Sebaceous cyst: Secondary | ICD-10-CM | POA: Diagnosis not present

## 2021-05-25 DIAGNOSIS — R059 Cough, unspecified: Secondary | ICD-10-CM | POA: Diagnosis not present

## 2021-05-25 MED ORDER — DOXYCYCLINE HYCLATE 100 MG PO TABS: 100.0000 mg | ORAL_TABLET | Freq: Two times a day (BID) | ORAL | 0 refills | Status: DC

## 2021-05-25 MED ORDER — LOSARTAN POTASSIUM 50 MG PO TABS: 50.0000 mg | ORAL_TABLET | Freq: Every day | ORAL | 0 refills | Status: DC

## 2021-05-25 MED ORDER — ALBUTEROL SULFATE HFA 108 (90 BASE) MCG/ACT IN AERS: 2.0000 | INHALATION_SPRAY | Freq: Four times a day (QID) | RESPIRATORY_TRACT | 0 refills | Status: DC | PRN

## 2021-05-25 MED ORDER — BENZONATATE 100 MG PO CAPS: 100.0000 mg | ORAL_CAPSULE | Freq: Three times a day (TID) | ORAL | 0 refills | Status: DC | PRN

## 2021-05-25 NOTE — Progress Notes (Signed)
Subjective:    Patient ID: Jackson French, male    DOB: February 01, 1951, 70 y.o.   MRN: [id number]  HPI  Pt in for some chest congestion and some productive cough late night(and in am).   No fever, no chills or sweats. Mild wheeze. Then states more shortness of breath.  No bodyaches. Coughing more at night. No lower leg swelling.   On week ago got covid vaccine.  Pt has left upper back small raised area. Pt noticed lump 2 months ago. Hx of acne on back as youth. Not tender.  Hx of htn. Pt getting some lower end bp in 637-CHYIF systolic. Sometimes when stands feels dizzy breifly.    Review of Systems  Constitutional:  Negative for chills.  HENT:  Negative for congestion and ear pain.   Eyes:  Negative for photophobia and pain.  Respiratory:  Positive for cough and wheezing. Negative for chest tightness and shortness of breath.        Minimal wheezing recently.  Cardiovascular:  Negative for chest pain and palpitations.  Gastrointestinal:  Negative for abdominal pain, blood in stool and constipation.  Musculoskeletal:  Negative for back pain.  Skin:  Negative for rash.    Past Medical History:  Diagnosis Date   Allergy    Gout    Hyperlipidemia    Hypertension    Sleep apnea    no CPAP   Thyroid disease      Social History   Socioeconomic History   Marital status: Married    Spouse name: Not on file   Number of children: Not on file   Years of education: Not on file   Highest education level: Not on file  Occupational History   Not on file  Tobacco Use   Smoking status: Never   Smokeless tobacco: Never  Vaping Use   Vaping Use: Never used  Substance and Sexual Activity   Alcohol use: Yes    Comment: rarely   Drug use: Never   Sexual activity: Yes  Other Topics Concern   Not on file  Social History Narrative   Not on file   Social Determinants of Health   Financial Resource Strain: Not on file  Food Insecurity: Not on file  Transportation Needs: Not  on file  Physical Activity: Not on file  Stress: Not on file  Social Connections: Not on file  Intimate Partner Violence: Not on file    Past Surgical History:  Procedure Laterality Date   APPENDECTOMY     CATARACT EXTRACTION, BILATERAL Bilateral    COLONOSCOPY     HYDROCELE EXCISION / REPAIR     THYROID SURGERY     TONSILLECTOMY     VASECTOMY      Family History  Problem Relation Age of Onset   Arthritis Mother    Breast cancer Mother    Stomach cancer Father 88       duodenal cancer   Heart attack Father    Cancer - Colon Neg Hx    Colon cancer Neg Hx    Rectal cancer Neg Hx    Esophageal cancer Neg Hx     No Known Allergies  Current Outpatient Medications on File Prior to Visit  Medication Sig Dispense Refill   chlorthalidone (HYGROTON) 25 MG tablet TAKE 1 TABLET(25 MG) BY MOUTH DAILY 90 tablet 0   fluticasone (FLONASE) 50 MCG/ACT nasal spray SPRAY 2 SPRAYS INTO EACH NOSTRIL EVERY DAY (Patient taking differently: as needed.) 48 mL 1  influenza vaccine adjuvanted (FLUAD) 0.5 ML injection Inject into the muscle. 0.5 mL 0   levocetirizine (XYZAL) 5 MG tablet Take 1 tablet (5 mg total) by mouth every evening. (Patient taking differently: Take 5 mg by mouth at bedtime as needed.) 90 tablet 1   levothyroxine (SYNTHROID) 112 MCG tablet TAKE 1 TABLET BY MOUTH DAILY BEFORE BREAKFAST. 90 tablet 1   losartan (COZAAR) 100 MG tablet TAKE 1 TABLET (100 MG TOTAL) BY MOUTH DAILY. 90 tablet 3   pantoprazole (PROTONIX) 40 MG tablet Take 1 tablet (40 mg total) by mouth daily. 30 tablet 3   simvastatin (ZOCOR) 20 MG tablet TAKE 1 TABLET BY MOUTH EVERY DAY 90 tablet 1   No current facility-administered medications on file prior to visit.    BP 112/70   Pulse 86   Temp 97.8 F (36.6 C)   Resp 18   Ht 6\' 4"  (1.93 m)   Wt 299 lb (135.6 kg)   SpO2 98%   BMI 36.40 kg/m       Objective:   Physical Exam  General- No acute distress. Pleasant patient. Neck- Full range of motion,  no jvd Lungs- Clear, even and unlabored. Heart- regular rate and rhythm. Neurologic- CNII- XII grossly intact.   Skin- scattered freckles all over his back. Scattered seborrhic keratosis as well. Left upper back appears has inflamed probable infected sebacious cyst. 1/2 of cyst area looked possible scared other had sk like.    Assessment & Plan:   Patient Instructions  For bronchitis rx doxycycline antibiotic. Rx advisement given. Rx benzonatate for cough.  Recent covid vaccine one week ago. For caution sake can do rapid test otc. If + let me know.  For wheezing rx albuterol. You report some dyspnea significant at night so get cr.  For sebaceous cyst likely infected rx doxycycline. Also go ahead and refer you to dermatologist to evaluate since some atypical features.  BP very tightly controlled and slight dizzy on standing. Rx lower dose losartan 50 mg daily.  Follow up next Tuesday for nurse bp visit or sooner if needed.   Mackie Pai, PA-C

## 2021-05-25 NOTE — Patient Instructions (Addendum)
For bronchitis rx doxycycline antibiotic. Rx advisement given. Rx benzonatate for cough.  Recent covid vaccine one week ago. For caution sake can do rapid test otc. If + let me know.  For wheezing rx albuterol. You report some dyspnea significant at night so get cr.  For sebaceous cyst likely infected rx doxycycline. Also go ahead and refer you to dermatologist to evaluate since some atypical features.  BP very tightly controlled and slight dizzy on standing. Rx lower dose losartan 50 mg daily.  Follow up next Tuesday for nurse bp visit or sooner if needed.

## 2021-06-01 ENCOUNTER — Other Ambulatory Visit: Payer: Self-pay

## 2021-06-01 ENCOUNTER — Ambulatory Visit (INDEPENDENT_AMBULATORY_CARE_PROVIDER_SITE_OTHER): Payer: Medicare Other | Admitting: Medical

## 2021-06-01 DIAGNOSIS — E039 Hypothyroidism, unspecified: Secondary | ICD-10-CM

## 2021-06-01 DIAGNOSIS — E538 Deficiency of other specified B group vitamins: Secondary | ICD-10-CM

## 2021-06-01 DIAGNOSIS — I10 Essential (primary) hypertension: Secondary | ICD-10-CM

## 2021-06-01 NOTE — Addendum Note (Signed)
Addended by: Anabel Halon on: 06/01/2021 01:46 PM   Modules accepted: Level of Service

## 2021-06-01 NOTE — Progress Notes (Signed)
BP Readings from Last 3 Encounters:  05/25/21 112/70  04/23/21 119/81  04/13/21 118/88    Here for bp check today after lowering Losartan to 50 mg daily on 05/25/21.   BP today is 126/84  with a pulse of 74.  Per provider patient advised to continue to take new dose and follow up in 3 months.   Rod Holler CMA  Mackie Pai, PA-C

## 2021-06-08 ENCOUNTER — Ambulatory Visit (INDEPENDENT_AMBULATORY_CARE_PROVIDER_SITE_OTHER): Payer: Medicare Other

## 2021-06-08 VITALS — Ht 76.0 in | Wt 299.0 lb

## 2021-06-08 DIAGNOSIS — Z0000 Encounter for general adult medical examination without abnormal findings: Secondary | ICD-10-CM | POA: Diagnosis not present

## 2021-06-08 NOTE — Progress Notes (Addendum)
Subjective:   Jackson French is a 70 y.o. male who presents for Medicare Annual/Subsequent preventive examination.   I connected with [name] today by telephone and verified that I am speaking with the correct person using two identifiers. Location patient: home Location provider: work Persons participating in the virtual visit: patient, [name].    I discussed the limitations, risks, security and privacy concerns of performing an evaluation and management service by telephone and the availability of in person appointments. I also discussed with the patient that there may be a patient responsible charge related to this service. The patient expressed understanding and verbally consented to this telephonic visit.    Interactive audio and video telecommunications were attempted between this provider and patient, however failed, due to patient having technical difficulties OR patient did not have access to video capability.  We continued and completed visit with audio only.  Some vital signs may be absent or patient reported.   Time Spent with patient on telephone encounter: 20 minutes  Review of Systems     Cardiac Risk Factors include: advanced age (>5men, >42 women);male gender;dyslipidemia;hypertension;obesity (BMI >30kg/m2)     Objective:    Today's Vitals   06/08/21 0905  Weight: 299 lb (135.6 kg)  Height: 6\' 4"  (1.93 m)   Body mass index is 36.4 kg/m.  Advanced Directives 06/08/2021  Does Patient Have a Medical Advance Directive? Yes  Type of Paramedic of [place];Living will  Does patient want to make changes to medical advance directive? Yes (MAU/Ambulatory/Procedural Areas - Information given)  Copy of [place] in Chart? No - copy requested    Current Medications (verified) Outpatient Encounter Medications as of 06/08/2021  Medication Sig   albuterol (VENTOLIN HFA) 108 (90 Base) MCG/ACT inhaler INHALE 2 PUFFS INTO THE LUNGS  EVERY 6 HOURS AS NEEDED   benzonatate (TESSALON) 100 MG capsule Take 1 capsule (100 mg total) by mouth 3 (three) times daily as needed for cough.   chlorthalidone (HYGROTON) 25 MG tablet TAKE 1 TABLET(25 MG) BY MOUTH DAILY   doxycycline (VIBRA-TABS) 100 MG tablet Take 1 tablet (100 mg total) by mouth 2 (two) times daily. Can give caps or generic.   fluticasone (FLONASE) 50 MCG/ACT nasal spray SPRAY 2 SPRAYS INTO EACH NOSTRIL EVERY DAY (Patient taking differently: as needed.)   influenza vaccine adjuvanted (FLUAD) 0.5 ML injection Inject into the muscle.   levocetirizine (XYZAL) 5 MG tablet Take 1 tablet (5 mg total) by mouth every evening. (Patient taking differently: Take 5 mg by mouth at bedtime as needed.)   levothyroxine (SYNTHROID) 112 MCG tablet TAKE 1 TABLET BY MOUTH DAILY BEFORE BREAKFAST.   losartan (COZAAR) 50 MG tablet TAKE 1 TABLET(50 MG) BY MOUTH DAILY   pantoprazole (PROTONIX) 40 MG tablet Take 1 tablet (40 mg total) by mouth daily.   simvastatin (ZOCOR) 20 MG tablet TAKE 1 TABLET BY MOUTH EVERY DAY   No facility-administered encounter medications on file as of 06/08/2021.    Allergies (verified) Patient has no known allergies.   History: Past Medical History:  Diagnosis Date   Allergy    Gout    Hyperlipidemia    Hypertension    Sleep apnea    no CPAP   Thyroid disease    Past Surgical History:  Procedure Laterality Date   APPENDECTOMY     CATARACT EXTRACTION, BILATERAL Bilateral    COLONOSCOPY     HYDROCELE EXCISION / REPAIR     THYROID SURGERY  TONSILLECTOMY     VASECTOMY     Family History  Problem Relation Age of Onset   Arthritis Mother    Breast cancer Mother    Stomach cancer Father 28       duodenal cancer   Heart attack Father    Cancer - Colon Neg Hx    Colon cancer Neg Hx    Rectal cancer Neg Hx    Esophageal cancer Neg Hx    Social History   Socioeconomic History   Marital status: Married    Spouse name: Not on file   Number of  children: Not on file   Years of education: Not on file   Highest education level: Not on file  Occupational History   Not on file  Tobacco Use   Smoking status: Never   Smokeless tobacco: Never  Vaping Use   Vaping Use: Never used  Substance and Sexual Activity   Alcohol use: Yes    Comment: rarely   Drug use: Never   Sexual activity: Yes  Other Topics Concern   Not on file  Social History Narrative   Not on file   Social Determinants of Health   Financial Resource Strain: Low Risk    Difficulty of Paying Living Expenses: Not hard at all  Food Insecurity: No Food Insecurity   Worried About Charity fundraiser in the Last Year: Never true   Ran Out of Food in the Last Year: Never true  Transportation Needs: No Transportation Needs   Lack of Transportation (Medical): No   Lack of Transportation (Non-Medical): No  Physical Activity: Inactive   Days of Exercise per Week: 0 days   Minutes of Exercise per Session: 0 min  Stress: No Stress Concern Present   Feeling of Stress : Not at all  Social Connections: Moderately Isolated   Frequency of Communication with Friends and Family: More than three times a week   Frequency of Social Gatherings with Friends and Family: More than three times a week   Attends Religious Services: Never   [name] or Organizations: No   Attends Music therapist: Never   Marital Status: Married    Tobacco Counseling Counseling given: Not Answered   Clinical Intake:  Pre-visit preparation completed: Yes  Pain : No/denies pain     BMI - recorded: 36.4 Nutritional Status: BMI > 30  Obese Nutritional Risks: None Diabetes: No  How often do you need to have someone help you when you read instructions, pamphlets, or other written materials from your doctor or pharmacy?: 1 - Never  Diabetic?No  Interpreter Needed?: No  Information entered by :: [name]   Activities of Daily Living In your present  state of health, do you have any difficulty performing the following activities: 06/08/2021 10/13/2020  Hearing? N N  Vision? N N  Difficulty concentrating or making decisions? N N  Walking or climbing stairs? N N  Dressing or bathing? N N  Doing errands, shopping? N N  Preparing Food and eating ? N -  Using the Toilet? N -  In the past six months, have you accidently leaked urine? N -  Do you have problems with loss of bowel control? N -  Managing your Medications? N -  Managing your Finances? N -  Housekeeping or managing your Housekeeping? N -  Some recent data might be hidden    Patient Care Team: Saguier, Iris Pert as PCP - General (Internal Medicine)  Lucas Mallow, MD as Consulting Physician (Urology)  Indicate any recent Medical Services you may have received from other than [facility] providers in the past year (date may be approximate).     Assessment:   This is a routine wellness examination for Hayato.  Hearing/Vision screen Hearing Screening - Comments:: No issues Vision Screening - Comments:: Last eye exam-06/2020  Dietary issues and exercise activities discussed: Current Exercise Habits: The patient does not participate in regular exercise at present, Exercise limited by: None identified   Goals Addressed             This Visit's Progress    Patient Stated       Drink more water & eat healthier       Depression Screen PHQ 2/9 Scores 06/08/2021 04/01/2021 10/13/2020  PHQ - 2 Score 0 0 0    Fall Risk Fall Risk  06/08/2021 04/01/2021  Falls in the past year? 0 1  Number falls in past yr: 0 1  Injury with Fall? 0 0  Follow up Falls prevention discussed -    FALL RISK PREVENTION PERTAINING TO THE HOME:  Any stairs in or around the home? Yes  If so, are there any without handrails? No  Home free of loose throw rugs in walkways, pet beds, electrical cords, etc? Yes  Adequate lighting in your home to reduce risk of falls? Yes   ASSISTIVE DEVICES  UTILIZED TO PREVENT FALLS:  Life alert? No  Use of a cane, walker or w/c? No  Grab bars in the bathroom? Yes  Shower chair or bench in shower? Yes  Elevated toilet seat or a handicapped toilet? No   TIMED UP AND GO:  Was the test performed? No . Phone visit   Cognitive Function:Normal cognitive status assessed by this Nurse Health Advisor. No abnormalities found.          Immunizations Immunization History  Administered Date(s) Administered   Fluad Quad(high Dose 65+) 05/25/2019, 05/20/2021   Influenza Split 05/14/2013   Influenza, High Dose Seasonal PF 05/16/2018   Influenza-Unspecified 05/17/2016, 05/31/2017   Moderna Covid-19 Vaccine Bivalent Booster 50yrs & up 05/20/2021   Moderna SARS-COV2 Booster Vaccination 06/16/2020   Moderna Sars-Covid-2 Vaccination 09/12/2019, 10/15/2019    TDAP status: Up to date  Flu Vaccine status: Up to date  Pneumococcal vaccine status: Due, Education has been provided regarding the importance of this vaccine. Advised may receive this vaccine at local pharmacy or Health Dept. Aware to provide a copy of the vaccination record if obtained from local pharmacy or Health Dept. Verbalized acceptance and understanding.  Covid-19 vaccine status: Completed vaccines  Qualifies for Shingles Vaccine? Yes   Zostavax completed No   Shingrix Completed?: No.    Education has been provided regarding the importance of this vaccine. Patient has been advised to call insurance company to determine out of pocket expense if they have not yet received this vaccine. Advised may also receive vaccine at local pharmacy or Health Dept. Verbalized acceptance and understanding.  Screening Tests Health Maintenance  Topic Date Due   Hepatitis C Screening  Never done   Zoster Vaccines- Shingrix (1 of 2) Never done   Pneumonia Vaccine 55+ Years old (1 - PCV) Never done   COLONOSCOPY (Pts 45-48yrs Insurance coverage will need to be confirmed)  04/23/2022   TETANUS/TDAP   01/19/2027   INFLUENZA VACCINE  Completed   COVID-19 Vaccine  Completed   HPV VACCINES  Aged Out    Health Maintenance  Health Maintenance Due  Topic Date Due   Hepatitis C Screening  Never done   Zoster Vaccines- Shingrix (1 of 2) Never done   Pneumonia Vaccine 70+ Years old (1 - PCV) Never done    Colorectal cancer screening: Type of screening: Colonoscopy. Completed 04/23/2021. Repeat every 1 years  Lung Cancer Screening: (Low Dose CT Chest recommended if Age 23-80 years, 30 pack-year currently smoking OR have quit w/in 15years.) does not qualify.     Additional Screening:  Hepatitis C Screening: does qualify; Patient to discuss with PCP at next office visit.  Vision Screening: Recommended annual ophthalmology exams for early detection of glaucoma and other disorders of the eye. Is the patient up to date with their annual eye exam?  Yes  Who is the provider or what is the name of the office in which the patient attends annual eye exams? Pt unsure of name   Dental Screening: Recommended annual dental exams for proper oral hygiene  Community Resource Referral / Chronic Care Management: CRR required this visit?  No   CCM required this visit?  No      Plan:     I have personally reviewed and noted the following in the patient's chart:   Medical and social history Use of alcohol, tobacco or illicit drugs  Current medications and supplements including opioid prescriptions. Patient is not currently taking opioid prescriptions. Functional ability and status Nutritional status Physical activity Advanced directives List of other physicians Hospitalizations, surgeries, and ER visits in previous 12 months Vitals Screenings to include cognitive, depression, and falls Referrals and appointments  In addition, I have reviewed and discussed with patient certain preventive protocols, quality metrics, and best practice recommendations. A written personalized care plan for  preventive services as well as general preventive health recommendations were provided to patient.   Due to this being a telephonic visit, the after visit summary with patients personalized plan was offered to patient via mail or my-chart.  Patient would like to access on my-chart.    Marta Antu, LPN   [id number]  Nurse Health Advisor  Nurse Notes: None  Reviewed and agree with A/P of lpn.  Mackie Pai, PA-C

## 2021-06-08 NOTE — Patient Instructions (Signed)
Jackson French , Thank you for taking time to complete your Medicare Wellness Visit. I appreciate your ongoing commitment to your health goals. Please review the following plan we discussed and let me know if I can assist you in the future.   Screening recommendations/referrals: Colonoscopy: Completed 04/23/2021-Due 04/23/2022 Recommended yearly ophthalmology/optometry visit for glaucoma screening and checkup Recommended yearly dental visit for hygiene and checkup  Vaccinations: Influenza vaccine: Up to date Pneumococcal vaccine: Due-May obtain vaccine at our office or your local pharmacy. Tdap vaccine: Up to date-Due-01/19/2027 Shingles vaccine: Discuss with pharmacy   Covid-19: Up to date  Advanced directives: Please bring a copy of Living Will and/or Healthcare Power of Attorney for your chart.   Conditions/risks identified: See problem list  Next appointment: Follow up in one year for your annual wellness visit. 06/14/2022 @ 9:00  Preventive Care 65 Years and Older, Male Preventive care refers to lifestyle choices and visits with your health care provider that can promote health and wellness. What does preventive care include? A yearly physical exam. This is also called an annual well check. Dental exams once or twice a year. Routine eye exams. Ask your health care provider how often you should have your eyes checked. Personal lifestyle choices, including: Daily care of your teeth and gums. Regular physical activity. Eating a healthy diet. Avoiding tobacco and drug use. Limiting alcohol use. Practicing safe sex. Taking low doses of aspirin every day. Taking vitamin and mineral supplements as recommended by your health care provider. What happens during an annual well check? The services and screenings done by your health care provider during your annual well check will depend on your age, overall health, lifestyle risk factors, and family history of disease. Counseling  Your  health care provider may ask you questions about your: Alcohol use. Tobacco use. Drug use. Emotional well-being. Home and relationship well-being. Sexual activity. Eating habits. History of falls. Memory and ability to understand (cognition). Work and work Statistician. Screening  You may have the following tests or measurements: Height, weight, and BMI. Blood pressure. Lipid and cholesterol levels. These may be checked every 5 years, or more frequently if you are over 63 years old. Skin check. Lung cancer screening. You may have this screening every year starting at age 75 if you have a 30-pack-year history of smoking and currently smoke or have quit within the past 15 years. Fecal occult blood test (FOBT) of the stool. You may have this test every year starting at age 42. Flexible sigmoidoscopy or colonoscopy. You may have a sigmoidoscopy every 5 years or a colonoscopy every 10 years starting at age 3. Prostate cancer screening. Recommendations will vary depending on your family history and other risks. Hepatitis C blood test. Hepatitis B blood test. Sexually transmitted disease (STD) testing. Diabetes screening. This is done by checking your blood sugar (glucose) after you have not eaten for a while (fasting). You may have this done every 1-3 years. Abdominal aortic aneurysm (AAA) screening. You may need this if you are a current or former smoker. Osteoporosis. You may be screened starting at age 7 if you are at high risk. Talk with your health care provider about your test results, treatment options, and if necessary, the need for more tests. Vaccines  Your health care provider may recommend certain vaccines, such as: Influenza vaccine. This is recommended every year. Tetanus, diphtheria, and acellular pertussis (Tdap, Td) vaccine. You may need a Td booster every 10 years. Zoster vaccine. You may need this after  age 50. Pneumococcal 13-valent conjugate (PCV13) vaccine. One dose  is recommended after age 54. Pneumococcal polysaccharide (PPSV23) vaccine. One dose is recommended after age 98. Talk to your health care provider about which screenings and vaccines you need and how often you need them. This information is not intended to replace advice given to you by your health care provider. Make sure you discuss any questions you have with your health care provider. Document Released: 08/21/2015 Document Revised: 04/13/2016 Document Reviewed: 05/26/2015 Elsevier Interactive Patient Education  2017 Portal Prevention in the Home Falls can cause injuries. They can happen to people of all ages. There are many things you can do to make your home safe and to help prevent falls. What can I do on the outside of my home? Regularly fix the edges of walkways and driveways and fix any cracks. Remove anything that might make you trip as you walk through a door, such as a raised step or threshold. Trim any bushes or trees on the path to your home. Use bright outdoor lighting. Clear any walking paths of anything that might make someone trip, such as rocks or tools. Regularly check to see if handrails are loose or broken. Make sure that both sides of any steps have handrails. Any raised decks and porches should have guardrails on the edges. Have any leaves, snow, or ice cleared regularly. Use sand or salt on walking paths during winter. Clean up any spills in your garage right away. This includes oil or grease spills. What can I do in the bathroom? Use night lights. Install grab bars by the toilet and in the tub and shower. Do not use towel bars as grab bars. Use non-skid mats or decals in the tub or shower. If you need to sit down in the shower, use a plastic, non-slip stool. Keep the floor dry. Clean up any water that spills on the floor as soon as it happens. Remove soap buildup in the tub or shower regularly. Attach bath mats securely with double-sided non-slip rug  tape. Do not have throw rugs and other things on the floor that can make you trip. What can I do in the bedroom? Use night lights. Make sure that you have a light by your bed that is easy to reach. Do not use any sheets or blankets that are too big for your bed. They should not hang down onto the floor. Have a firm chair that has side arms. You can use this for support while you get dressed. Do not have throw rugs and other things on the floor that can make you trip. What can I do in the kitchen? Clean up any spills right away. Avoid walking on wet floors. Keep items that you use a lot in easy-to-reach places. If you need to reach something above you, use a strong step stool that has a grab bar. Keep electrical cords out of the way. Do not use floor polish or wax that makes floors slippery. If you must use wax, use non-skid floor wax. Do not have throw rugs and other things on the floor that can make you trip. What can I do with my stairs? Do not leave any items on the stairs. Make sure that there are handrails on both sides of the stairs and use them. Fix handrails that are broken or loose. Make sure that handrails are as long as the stairways. Check any carpeting to make sure that it is firmly attached to the stairs.  Fix any carpet that is loose or worn. Avoid having throw rugs at the top or bottom of the stairs. If you do have throw rugs, attach them to the floor with carpet tape. Make sure that you have a light switch at the top of the stairs and the bottom of the stairs. If you do not have them, ask someone to add them for you. What else can I do to help prevent falls? Wear shoes that: Do not have high heels. Have rubber bottoms. Are comfortable and fit you well. Are closed at the toe. Do not wear sandals. If you use a stepladder: Make sure that it is fully opened. Do not climb a closed stepladder. Make sure that both sides of the stepladder are locked into place. Ask someone to  hold it for you, if possible. Clearly mark and make sure that you can see: Any grab bars or handrails. First and last steps. Where the edge of each step is. Use tools that help you move around (mobility aids) if they are needed. These include: Canes. Walkers. Scooters. Crutches. Turn on the lights when you go into a dark area. Replace any light bulbs as soon as they burn out. Set up your furniture so you have a clear path. Avoid moving your furniture around. If any of your floors are uneven, fix them. If there are any pets around you, be aware of where they are. Review your medicines with your doctor. Some medicines can make you feel dizzy. This can increase your chance of falling. Ask your doctor what other things that you can do to help prevent falls. This information is not intended to replace advice given to you by your health care provider. Make sure you discuss any questions you have with your health care provider. Document Released: 05/21/2009 Document Revised: 12/31/2015 Document Reviewed: 08/29/2014 Elsevier Interactive Patient Education  2017 [employer].

## 2021-06-11 ENCOUNTER — Other Ambulatory Visit (HOSPITAL_BASED_OUTPATIENT_CLINIC_OR_DEPARTMENT_OTHER): Payer: Self-pay

## 2021-06-11 DIAGNOSIS — Z23 Encounter for immunization: Secondary | ICD-10-CM | POA: Diagnosis not present

## 2021-06-11 MED ORDER — MODERNA COVID-19 BIVAL BOOSTER 50 MCG/0.5ML IM SUSP
INTRAMUSCULAR | 0 refills | Status: DC
  Filled 2021-06-11: qty 0.5, 1d supply, fill #0

## 2021-06-29 ENCOUNTER — Other Ambulatory Visit: Payer: Self-pay | Admitting: Medical

## 2021-08-14 ENCOUNTER — Other Ambulatory Visit: Payer: Self-pay | Admitting: Medical

## 2021-08-14 ENCOUNTER — Other Ambulatory Visit: Payer: Self-pay | Admitting: Internal Medicine

## 2021-08-14 DIAGNOSIS — K2100 Gastro-esophageal reflux disease with esophagitis, without bleeding: Secondary | ICD-10-CM

## 2021-09-27 ENCOUNTER — Encounter: Payer: Self-pay | Admitting: Medical

## 2021-09-28 MED ORDER — LEVOTHYROXINE SODIUM 112 MCG PO TABS: ORAL_TABLET | ORAL | 1 refills | Status: DC

## 2021-11-06 ENCOUNTER — Encounter: Payer: Self-pay | Admitting: Internal Medicine

## 2021-11-08 NOTE — Telephone Encounter (Signed)
Patient had esophagitis at EGD in September thus would continue pantoprazole 40 mg daily, okay to refill x1 year ?Colonoscopy is recommended in September of this year; he should receive contact from the office around this time to schedule.  If he wishes to schedule this before he hears from [place] September schedule should be out in late June 2023.  He can call back at that time or send another MyChart message. ?JMP ?

## 2021-11-17 ENCOUNTER — Encounter: Payer: Self-pay | Admitting: Medical

## 2021-11-17 ENCOUNTER — Ambulatory Visit (HOSPITAL_BASED_OUTPATIENT_CLINIC_OR_DEPARTMENT_OTHER)
Admission: RE | Admit: 2021-11-17 | Discharge: 2021-11-17 | Disposition: A | Discharge status: Home / Self Care | Payer: Medicare Other | Source: Ambulatory Visit | Attending: Medical | Admitting: Medical

## 2021-11-17 ENCOUNTER — Other Ambulatory Visit: Payer: Self-pay

## 2021-11-17 ENCOUNTER — Ambulatory Visit (INDEPENDENT_AMBULATORY_CARE_PROVIDER_SITE_OTHER): Payer: Medicare Other | Admitting: Medical

## 2021-11-17 VITALS — BP 136/80 | HR 74 | Resp 18 | Ht 76.0 in | Wt 308.0 lb

## 2021-11-17 DIAGNOSIS — M5137 Other intervertebral disc degeneration, lumbosacral region: Secondary | ICD-10-CM | POA: Diagnosis not present

## 2021-11-17 DIAGNOSIS — N528 Other male erectile dysfunction: Secondary | ICD-10-CM | POA: Diagnosis not present

## 2021-11-17 DIAGNOSIS — M5440 Lumbago with sciatica, unspecified side: Secondary | ICD-10-CM

## 2021-11-17 DIAGNOSIS — M545 Low back pain, unspecified: Secondary | ICD-10-CM | POA: Diagnosis not present

## 2021-11-17 DIAGNOSIS — I10 Essential (primary) hypertension: Secondary | ICD-10-CM

## 2021-11-17 MED ORDER — SILDENAFIL CITRATE 50 MG PO TABS: 50.0000 mg | ORAL_TABLET | Freq: Every day | ORAL | 0 refills | Status: DC | PRN

## 2021-11-17 MED ORDER — CHLORTHALIDONE 25 MG PO TABS: ORAL_TABLET | ORAL | 0 refills | Status: DC

## 2021-11-17 MED ORDER — LEVOCETIRIZINE DIHYDROCHLORIDE 5 MG PO TABS: 5.0000 mg | ORAL_TABLET | Freq: Every evening | ORAL | 1 refills | Status: DC

## 2021-11-17 NOTE — Progress Notes (Signed)
? ?Subjective:  ? ? Patient ID: Jackson French, male    DOB: [birth year], 71 y.o.   MRN: [id number] ? ?HPI ? ?Pt states last 4-5 months has back pain. Pain is getting worse. He states radiating pain and numbness to rt lower ext. When he shops at grocery store pain will increase. He states can't walk more than 1/10 of mile without symptoms getting severe. If rest/sits down symptoms will decrease a lot. ? ?Pt not taking any medication for pain.  ? ?Htn- bp borderline. ? ? ?Review of Systems  ?Constitutional:  Negative for chills, fatigue and fever.  ?Respiratory:  Negative for cough, chest tightness and wheezing.   ?Cardiovascular:  Negative for chest pain and palpitations.  ?Gastrointestinal:  Negative for abdominal pain, blood in stool, diarrhea and nausea.  ?Genitourinary:  Negative for dysuria, flank pain and frequency.  ?     ED.  ?Musculoskeletal:  Positive for back pain. Negative for joint swelling and myalgias.  ?Skin:  Negative for rash.  ?Neurological:  Negative for dizziness, numbness and headaches.  ?Hematological:  Negative for adenopathy. Does not bruise/bleed easily.  ?Psychiatric/Behavioral:  Negative for behavioral problems, decreased concentration, dysphoric mood and sleep disturbance.   ? ? ?Past Medical History:  ?Diagnosis Date  ? Allergy   ? Gout   ? Hyperlipidemia   ? Hypertension   ? Sleep apnea   ? no CPAP  ? Thyroid disease   ? ?  ?Social History  ? ?Socioeconomic History  ? Marital status: Married  ?  Spouse name: Not on file  ? Number of children: Not on file  ? Years of education: Not on file  ? Highest education level: Not on file  ?Occupational History  ? Not on file  ?Tobacco Use  ? Smoking status: Never  ? Smokeless tobacco: Never  ?Vaping Use  ? Vaping Use: Never used  ?Substance and Sexual Activity  ? Alcohol use: Yes  ?  Comment: rarely  ? Drug use: Never  ? Sexual activity: Yes  ?Other Topics Concern  ? Not on file  ?Social History Narrative  ? Not on file  ? ?Social Determinants of  Health  ? ?Financial Resource Strain: Low Risk   ? Difficulty of Paying Living Expenses: Not hard at all  ?Food Insecurity: No Food Insecurity  ? Worried About Charity fundraiser in the Last Year: Never true  ? Ran Out of Food in the Last Year: Never true  ?Transportation Needs: No Transportation Needs  ? Lack of Transportation (Medical): No  ? Lack of Transportation (Non-Medical): No  ?Physical Activity: Inactive  ? Days of Exercise per Week: 0 days  ? Minutes of Exercise per Session: 0 min  ?Stress: No Stress Concern Present  ? Feeling of Stress : Not at all  ?Social Connections: Moderately Isolated  ? Frequency of Communication with Friends and Family: More than three times a week  ? Frequency of Social Gatherings with Friends and Family: More than three times a week  ? Attends Religious Services: Never  ? Active Member of Clubs or Organizations: No  ? Attends Archivist Meetings: Never  ? Marital Status: Married  ?Intimate Partner Violence: Not At Risk  ? Fear of Current or Ex-Partner: No  ? Emotionally Abused: No  ? Physically Abused: No  ? Sexually Abused: No  ? ? ?Past Surgical History:  ?Procedure Laterality Date  ? APPENDECTOMY    ? CATARACT EXTRACTION, BILATERAL Bilateral   ? COLONOSCOPY    ?  HYDROCELE EXCISION / REPAIR    ? THYROID SURGERY    ? TONSILLECTOMY    ? VASECTOMY    ? ? ?Family History  ?Problem Relation Age of Onset  ? Arthritis Mother   ? Breast cancer Mother   ? Stomach cancer Father 40  ?     duodenal cancer  ? Heart attack Father   ? Cancer - Colon Neg Hx   ? Colon cancer Neg Hx   ? Rectal cancer Neg Hx   ? Esophageal cancer Neg Hx   ? ? ?No Known Allergies ? ?Current Outpatient Medications on File Prior to Visit  ?Medication Sig Dispense Refill  ? albuterol (VENTOLIN HFA) 108 (90 Base) MCG/ACT inhaler INHALE 2 PUFFS INTO THE LUNGS EVERY 6 HOURS AS NEEDED 54 g 0  ? COVID-19 mRNA bivalent vaccine, Moderna, (MODERNA COVID-19 BIVAL BOOSTER) 50 MCG/0.5ML injection Inject into the  muscle. 0.5 mL 0  ? fluticasone (FLONASE) 50 MCG/ACT nasal spray SPRAY 2 SPRAYS INTO EACH NOSTRIL EVERY DAY (Patient taking differently: as needed.) 48 mL 1  ? influenza vaccine adjuvanted (FLUAD) 0.5 ML injection Inject into the muscle. 0.5 mL 0  ? levothyroxine (SYNTHROID) 112 MCG tablet TAKE 1 TABLET BY MOUTH EVERY DAY BEFORE BREAKFAST 90 tablet 1  ? losartan (COZAAR) 50 MG tablet TAKE 1 TABLET(50 MG) BY MOUTH DAILY 90 tablet 2  ? pantoprazole (PROTONIX) 40 MG tablet TAKE 1 TABLET(40 MG) BY MOUTH DAILY 30 tablet 3  ? simvastatin (ZOCOR) 20 MG tablet TAKE 1 TABLET BY MOUTH EVERY DAY 90 tablet 1  ? ?No current facility-administered medications on file prior to visit.  ? ? ?BP 140/70   Pulse 74   Resp 18   Ht '6\' 4"'$  (1.93 m)   Wt (!) 308 lb (139.7 kg)   SpO2 98%   BMI 37.49 kg/m?  ?  ?   ?Objective:  ? Physical Exam ?General Appearance- Not in acute distress. ? ? ?Chest and Lung Exam ?Auscultation: ?Breath sounds:-Normal. Clear even and unlabored. ?Adventitious sounds:- No Adventitious sounds. ? ?Cardiovascular ?Auscultation:Rythm - Regular, rate and rythm. Heart Sounds -Normal heart sounds. ? ?Abdomen ?Inspection:-Inspection Normal.  ?Palpation/Perucssion: Palpation and Percussion of the abdomen reveal- Non Tender, No Rebound tenderness, No rigidity(Guarding) and No Palpable abdominal masses.  ?Liver:-Normal.  ?Spleen:- Normal.  ? ?Back ?Mid lumbar spine tenderness to palpation. ?Pain on straight leg lift. ?Pain on lateral movements and flexion/extension of the spine. ? ?Lower ext neurologic ? ?L5-S1 sensation intact bilaterally. ?Normal patellar reflexes bilaterally. ?No foot drop bilaterally.  ? ?  ? ? ?   ?Assessment & Plan:  ? ?Patient Instructions  ?For low back pain with sciatica/radicular symptoms did place lumbar xray order. Recommend rest when can to reduce pain. Use low ibuprofen and tylenol combo if needed. Consider mri if pain worsens/persist.  ? ? ?Htn- bp controlled. Continue losartan 50 mg and  chlorthalidone 25 mg daily. ? ?For obesity recommend weight loss. Follow former mediterranean diet that has worked for you in past. ? ?For ED rx viagra. ? ?Follow up on month or sooner if needed.  ? ?Mackie Pai, PA-C  ?

## 2021-11-17 NOTE — Patient Instructions (Addendum)
For low back pain with sciatica/radicular symptoms did place lumbar xray order. Recommend rest when can to reduce pain. Use low ibuprofen and tylenol combo if needed. Consider mri if pain worsens/persist.  ? ? ?Htn- bp controlled. Continue losartan 50 mg and chlorthalidone 25 mg daily. ? ?For obesity recommend weight loss. Follow former mediterranean diet that has worked for you in past. ? ?For ED rx viagra. ? ?Follow up on month or sooner if needed. ?

## 2021-11-27 ENCOUNTER — Other Ambulatory Visit: Payer: Self-pay | Admitting: Medical

## 2021-11-29 ENCOUNTER — Other Ambulatory Visit: Payer: Self-pay | Admitting: Internal Medicine

## 2021-11-29 DIAGNOSIS — K2100 Gastro-esophageal reflux disease with esophagitis, without bleeding: Secondary | ICD-10-CM

## 2021-12-12 ENCOUNTER — Encounter: Payer: Self-pay | Admitting: Medical

## 2021-12-13 MED ORDER — SIMVASTATIN 20 MG PO TABS: 20.0000 mg | ORAL_TABLET | Freq: Every day | ORAL | 1 refills | Status: DC

## 2021-12-22 ENCOUNTER — Other Ambulatory Visit: Payer: Self-pay | Admitting: Medical

## 2022-01-11 ENCOUNTER — Encounter: Payer: Self-pay | Admitting: Medical

## 2022-01-12 MED ORDER — LEVOTHYROXINE SODIUM 112 MCG PO TABS: ORAL_TABLET | ORAL | 1 refills | Status: DC

## 2022-02-01 ENCOUNTER — Encounter: Payer: Self-pay | Admitting: Medical

## 2022-02-01 DIAGNOSIS — K2100 Gastro-esophageal reflux disease with esophagitis, without bleeding: Secondary | ICD-10-CM

## 2022-02-02 MED ORDER — PANTOPRAZOLE SODIUM 40 MG PO TBEC: DELAYED_RELEASE_TABLET | ORAL | 3 refills | Status: DC

## 2022-02-15 ENCOUNTER — Ambulatory Visit (INDEPENDENT_AMBULATORY_CARE_PROVIDER_SITE_OTHER): Payer: Medicare Other | Admitting: Medical

## 2022-02-15 ENCOUNTER — Encounter: Payer: Self-pay | Admitting: Medical

## 2022-02-15 VITALS — BP 128/80 | HR 77 | Temp 98.2°F | Resp 18 | Ht 73.0 in | Wt 300.6 lb

## 2022-02-15 DIAGNOSIS — R350 Frequency of micturition: Secondary | ICD-10-CM | POA: Diagnosis not present

## 2022-02-15 DIAGNOSIS — R739 Hyperglycemia, unspecified: Secondary | ICD-10-CM

## 2022-02-15 DIAGNOSIS — E039 Hypothyroidism, unspecified: Secondary | ICD-10-CM

## 2022-02-15 DIAGNOSIS — R0683 Snoring: Secondary | ICD-10-CM | POA: Diagnosis not present

## 2022-02-15 DIAGNOSIS — E875 Hyperkalemia: Secondary | ICD-10-CM

## 2022-02-15 DIAGNOSIS — R5383 Other fatigue: Secondary | ICD-10-CM

## 2022-02-15 LAB — CBC WITH DIFFERENTIAL/PLATELET
Basophils Absolute: 0 10*3/uL (ref 0.0–0.1)
Basophils Relative: 0.5 % (ref 0.0–3.0)
Eosinophils Absolute: 0.5 10*3/uL (ref 0.0–0.7)
Eosinophils Relative: 7.2 % — ABNORMAL HIGH (ref 0.0–5.0)
HCT: 46.1 % (ref 39.0–52.0)
Hemoglobin: 15.5 g/dL (ref 13.0–17.0)
Lymphocytes Relative: 16.5 % (ref 12.0–46.0)
Lymphs Abs: 1.1 10*3/uL (ref 0.7–4.0)
MCHC: 33.7 g/dL (ref 30.0–36.0)
MCV: 93 fl (ref 78.0–100.0)
Monocytes Absolute: 0.6 10*3/uL (ref 0.1–1.0)
Monocytes Relative: 9 % (ref 3.0–12.0)
Neutro Abs: 4.3 10*3/uL (ref 1.4–7.7)
Neutrophils Relative %: 66.8 % (ref 43.0–77.0)
Platelets: 239 10*3/uL (ref 150.0–400.0)
RBC: 4.95 Mil/uL (ref 4.22–5.81)
RDW: 13.1 % (ref 11.5–15.5)
WBC: 6.5 10*3/uL (ref 4.0–10.5)

## 2022-02-15 LAB — COMPREHENSIVE METABOLIC PANEL
ALT: 44 U/L (ref 0–53)
AST: 39 U/L — ABNORMAL HIGH (ref 0–37)
Albumin: 4.6 g/dL (ref 3.5–5.2)
Alkaline Phosphatase: 62 U/L (ref 39–117)
BUN: 19 mg/dL (ref 6–23)
CO2: 28 mEq/L (ref 19–32)
Calcium: 9.4 mg/dL (ref 8.4–10.5)
Chloride: 97 mEq/L (ref 96–112)
Creatinine, Ser: 1.21 mg/dL (ref 0.40–1.50)
GFR: 60.33 mL/min (ref >60.00)
Glucose, Bld: 113 mg/dL — ABNORMAL HIGH (ref 70–99)
Potassium: 3.5 mEq/L (ref 3.5–5.1)
Sodium: 137 mEq/L (ref 135–145)
Total Bilirubin: 0.9 mg/dL (ref 0.2–1.2)
Total Protein: 6.6 g/dL (ref 6.0–8.3)

## 2022-02-15 LAB — PSA: PSA: 2.28 ng/mL (ref 0.10–4.00)

## 2022-02-15 LAB — VITAMIN B12: Vitamin B-12: 224 pg/mL (ref 211–911)

## 2022-02-15 LAB — T4, FREE: Free T4: 1.04 ng/dL (ref 0.60–1.60)

## 2022-02-15 LAB — TSH: TSH: 5.91 u[IU]/mL — ABNORMAL HIGH (ref 0.35–5.50)

## 2022-02-15 LAB — HEMOGLOBIN A1C: Hgb A1c MFr Bld: 6.3 % (ref 4.6–6.5)

## 2022-02-15 NOTE — Progress Notes (Signed)
Subjective:    Patient ID: Jackson French, male    DOB: [birth year], 71 y.o.   MRN: [id number]  HPI Pt states he does not feel like himself.   He states he is feeling some light headed episodes recently. He describes this occurring 2-3 times. Most of time was on standing. Last very briefly. Just last 5-10 seconds.  Pt feels tired on day to day basis. He admits this may be related to sleep apnea. He went thru sleep study. He states used cpap for about one year. He states he never slept well with cpap but that was 30 years ago.   Pt has hypothyroid- he is on thyroid medication. Pt has  history of goiter, lymphcytic thyroiditis. He had subtotal thyroidectomy. Told to take thyroid med in order to prevent the developement of hypothyroid or regrowth of goiter. Pt states did not take thyroid med for about 4 years after his surgery.   We had discussed potentially use of wegovy.  Pt needs to have repeat colonoscopy. He had 13 polyps and sees Dr. Hilarie Fredrickson.  Htn- on losartan 50 mg daily and chlorthalidone 25 mg daily.   Past Medical History:  Diagnosis Date   Allergy    Gout    Hyperlipidemia    Hypertension    Sleep apnea    no CPAP   Thyroid disease      Social History   Socioeconomic History   Marital status: Married    Spouse name: Not on file   Number of children: Not on file   Years of education: Not on file   Highest education level: Not on file  Occupational History   Not on file  Tobacco Use   Smoking status: Never   Smokeless tobacco: Never  Vaping Use   Vaping Use: Never used  Substance and Sexual Activity   Alcohol use: Yes    Comment: rarely   Drug use: Never   Sexual activity: Yes  Other Topics Concern   Not on file  Social History Narrative   Not on file   Social Determinants of Health   Financial Resource Strain: Low Risk  (06/08/2021)   Overall Financial Resource Strain (CARDIA)    Difficulty of Paying Living Expenses: Not hard at all  Food Insecurity:  No Food Insecurity (06/08/2021)   Hunger Vital Sign    Worried About Running Out of Food in the Last Year: Never true    [place] in the Last Year: Never true  Transportation Needs: No Transportation Needs (06/08/2021)   PRAPARE - Hydrologist (Medical): No    Lack of Transportation (Non-Medical): No  Physical Activity: Inactive (06/08/2021)   Exercise Vital Sign    Days of Exercise per Week: 0 days    Minutes of Exercise per Session: 0 min  Stress: No Stress Concern Present (06/08/2021)   [place]    Feeling of Stress : Not at all  Social Connections: Moderately Isolated (06/08/2021)   Social Connection and Isolation Panel [NHANES]    Frequency of Communication with Friends and Family: More than three times a week    Frequency of Social Gatherings with Friends and Family: More than three times a week    Attends Religious Services: Never    [name] or Organizations: No    Attends Archivist Meetings: Never    Marital Status: Married  Human resources officer Violence: Not At  Risk (06/08/2021)   Humiliation, Afraid, Rape, and Kick questionnaire    Fear of Current or Ex-Partner: No    Emotionally Abused: No    Physically Abused: No    Sexually Abused: No    Past Surgical History:  Procedure Laterality Date   APPENDECTOMY     CATARACT EXTRACTION, BILATERAL Bilateral    COLONOSCOPY     HYDROCELE EXCISION / REPAIR     THYROID SURGERY     TONSILLECTOMY     VASECTOMY      Family History  Problem Relation Age of Onset   Arthritis Mother    Breast cancer Mother    Stomach cancer Father 69       duodenal cancer   Heart attack Father    Cancer - Colon Neg Hx    Colon cancer Neg Hx    Rectal cancer Neg Hx    Esophageal cancer Neg Hx     No Known Allergies  Current Outpatient Medications on File Prior to Visit  Medication Sig Dispense Refill   albuterol  (VENTOLIN HFA) 108 (90 Base) MCG/ACT inhaler INHALE 2 PUFFS INTO THE LUNGS EVERY 6 HOURS AS NEEDED 54 g 0   chlorthalidone (HYGROTON) 25 MG tablet TAKE 1 TABLET(25 MG) BY MOUTH DAILY 90 tablet 0   COVID-19 mRNA bivalent vaccine, Moderna, (MODERNA COVID-19 BIVAL BOOSTER) 50 MCG/0.5ML injection Inject into the muscle. 0.5 mL 0   fluticasone (FLONASE) 50 MCG/ACT nasal spray SPRAY 2 SPRAYS INTO EACH NOSTRIL EVERY DAY (Patient taking differently: as needed.) 48 mL 1   influenza vaccine adjuvanted (FLUAD) 0.5 ML injection Inject into the muscle. 0.5 mL 0   levocetirizine (XYZAL) 5 MG tablet Take 1 tablet (5 mg total) by mouth every evening. 90 tablet 1   levothyroxine (SYNTHROID) 112 MCG tablet TAKE 1 TABLET BY MOUTH EVERY DAY BEFORE BREAKFAST 90 tablet 1   losartan (COZAAR) 50 MG tablet TAKE 1 TABLET(50 MG) BY MOUTH DAILY 90 tablet 2   pantoprazole (PROTONIX) 40 MG tablet TAKE 1 TABLET(40 MG) BY MOUTH DAILY 30 tablet 3   sildenafil (VIAGRA) 50 MG tablet Take 1 tablet (50 mg total) by mouth daily as needed for erectile dysfunction. 10 tablet 0   simvastatin (ZOCOR) 20 MG tablet Take 1 tablet (20 mg total) by mouth daily. 90 tablet 1   No current facility-administered medications on file prior to visit.    BP 128/80   Pulse 77   Temp 98.2 F (36.8 C)   Resp 18   Ht '6\' 1"'$  (1.854 m)   Wt (!) 300 lb 9.6 oz (136.4 kg)   SpO2 99%   BMI 39.66 kg/m     Review of Systems     Objective:   Physical Exam  General Mental Status- Alert. General Appearance- Not in acute distress.   Skin General: Color- Normal Color. Moisture- Normal Moisture.  Neck Carotid Arteries- Normal color. Moisture- Normal Moisture. No carotid bruits. No JVD.  Chest and Lung Exam Auscultation: Breath Sounds:-Normal.  Cardiovascular Auscultation:Rythm- Regular. Murmurs & Other Heart Sounds:Auscultation of the heart reveals- No Murmurs.  Abdomen Inspection:-Inspeection Normal. Palpation/Percussion:Note:No mass.  Palpation and Percussion of the abdomen reveal- Non Tender, Non Distended + BS, no rebound or guarding.   Neurologic Cranial Nerve exam:- CN III-XII intact(No nystagmus), symmetric smile. Strength:- 5/5 equal and symmetric strength both upper and lower extremities.       Assessment & Plan:   Patient Instructions  Dizziness mild and intermittent usually only on standing and lasting  for 5 to 10 seconds.  Blood pressure in good range presently.  Continue current BP medications.  Would recommend that you check your blood pressure days when you have low blood pressure.  If blood pressure trending downward then would decrease BP medication.  Continue losartan and chlorthalidone presently.  Fatigue with history of snoring.  Placed referral to pulmonologist for evaluation and treatment.  You might tolerate newer CPAP better than he did 30 years ago.  I will get fatigue labs listed today.  Hypothyroidism-on review appears that you had Hashimoto's.  Currently on thyroid supplementation we will follow your TSH and T4.  After discussion decided to go ahead and refer you to endocrinologist.  Obesity-you are still thinking about whether or not you want to start medication Wegovy or similar type.  I do not see any obvious contraindications presently.  No family history of thyroid medullary cancer and no history of pancreatitis.  Please contact your GI MD for repeat colonoscopy.  Follow-up 1 month or sooner if needed.   Time spent with patient today was 41  minutes which consisted of chart revdiew, discussing diagnosis, work up treatment and documentation.

## 2022-02-15 NOTE — Patient Instructions (Addendum)
Dizziness mild and intermittent usually only on standing and lasting for 5 to 10 seconds.  Blood pressure in good range presently.  Continue current BP medications.  Would recommend that you check your blood pressure days when you have low blood pressure.  If blood pressure trending downward then would decrease BP medication.  Continue losartan and chlorthalidone presently.  Fatigue with history of snoring.  Placed referral to pulmonologist for evaluation and treatment.  You might tolerate newer CPAP better than he did 30 years ago.  I will get fatigue labs listed today.  Hypothyroidism-on review appears that you had Hashimoto's.  Currently on thyroid supplementation we will follow your TSH and T4.  After discussion decided to go ahead and refer you to endocrinologist.  Obesity-you are still thinking about whether or not you want to start medication Wegovy or similar type.  I do not see any obvious contraindications presently.  No family history of thyroid medullary cancer and no history of pancreatitis.  Please contact your GI MD for repeat colonoscopy.  Follow-up 1 month or sooner if needed.

## 2022-02-16 ENCOUNTER — Telehealth: Payer: Self-pay | Admitting: Medical

## 2022-02-16 NOTE — Telephone Encounter (Signed)
Pt called endo referral and they stated they have not received referral. They were asking if it could be re-faxed to [phone] with appropriate labs. Please advise.

## 2022-02-17 ENCOUNTER — Encounter: Payer: Self-pay | Admitting: Medical

## 2022-02-17 ENCOUNTER — Telehealth: Payer: Self-pay | Admitting: Internal Medicine

## 2022-02-17 ENCOUNTER — Encounter: Payer: Self-pay | Admitting: Internal Medicine

## 2022-02-17 ENCOUNTER — Other Ambulatory Visit: Payer: Self-pay | Admitting: Medical

## 2022-02-17 MED ORDER — CHLORTHALIDONE 25 MG PO TABS: ORAL_TABLET | ORAL | 0 refills | Status: DC

## 2022-02-17 NOTE — Telephone Encounter (Signed)
He can stop PPI  No Barrett's If GERD symptoms recur he should let me know I would start B12 1000 mcg daily and then have PCP repeat B12 to ensure it is getting better If not better with oral therapy then would check anti-intrinsic factor AB and anti-parietal AB to screen for pernicious anemia

## 2022-02-17 NOTE — Telephone Encounter (Signed)
Pt called and wanted to let Dr. Hilarie Fredrickson know his B12 level of 224. Pt has decided to take oral supplementation. Wants to know if he needs to continue his protonix based on note that PCP sent him, see below:  Saguier, Percell Miller, PA-C to [employer]       02/17/22  8:29 AM Gerrie Nordmann can try b12 over the counter 5000 mcg daily dose. Injections typically would work faster but bit of burden compared to at home oral daily.   When you see GI MD mention about low b12. If they found barrets esophagitis they will typically want to continue ppi. So hard to work around ppi use under that scenario.   Next time your in office if more than 2 months we can check your b12 level at that time.   Thanks,   Mackie Pai, PA-C

## 2022-02-17 NOTE — Telephone Encounter (Signed)
Patient states Endo called him today and told him they have not received the referral and the labs. Informed pt that everything was faxed yesterday to [phone]. He stated that's the number the office gave him as well but that some how they have no received it yet. Patient asked if it was possible to refax it again. Please advise.

## 2022-02-17 NOTE — Telephone Encounter (Signed)
Spoke with pt and he is aware of Dr. Quentin Mulling recommendations.

## 2022-02-19 LAB — VITAMIN B1: Vitamin B1 (Thiamine): 11 nmol/L (ref 8–30)

## 2022-02-28 ENCOUNTER — Encounter: Payer: Self-pay | Admitting: Medical

## 2022-02-28 MED ORDER — LOSARTAN POTASSIUM 50 MG PO TABS
ORAL_TABLET | ORAL | 2 refills | Status: DC
Start: 2022-02-28 — End: 2022-08-29

## 2022-03-07 DIAGNOSIS — E039 Hypothyroidism, unspecified: Secondary | ICD-10-CM | POA: Diagnosis not present

## 2022-03-09 ENCOUNTER — Ambulatory Visit (AMBULATORY_SURGERY_CENTER): Payer: Self-pay | Admitting: *Deleted

## 2022-03-09 ENCOUNTER — Other Ambulatory Visit: Payer: Self-pay

## 2022-03-09 ENCOUNTER — Telehealth: Payer: Self-pay | Admitting: *Deleted

## 2022-03-09 VITALS — Ht 73.0 in | Wt 290.0 lb

## 2022-03-09 DIAGNOSIS — Z86010 Personal history of colonic polyps: Secondary | ICD-10-CM

## 2022-03-09 DIAGNOSIS — Z800 Family history of malignant neoplasm of digestive organs: Secondary | ICD-10-CM

## 2022-03-09 MED ORDER — NA SULFATE-K SULFATE-MG SULF 17.5-3.13-1.6 GM/177ML PO SOLN: 1.0000 | Freq: Once | ORAL | 0 refills | Status: AC

## 2022-03-09 NOTE — Progress Notes (Signed)
PV completed in person.  No egg or soy allergy known to patient  No issues known to pt with past sedation with any surgeries or procedures Patient denies ever being told they had issues or difficulty with intubation  No FH of Malignant Hyperthermia Pt is not on diet pills Pt is not on  home 02  Pt is not on blood thinners  Pt denies issues with constipation  No A fib or A flutter Pt instructed to use [email or web address] or GoodRx for a price reduction on prep

## 2022-03-09 NOTE — Telephone Encounter (Signed)
Dr Hilarie Fredrickson- Mr Waring asked if he could also have Endoscopy with his colonoscopy.  He has weaned off Protonix per your recommendation and with FH of gastric cancer, would like you take a look.  Please advise.  Thanks!

## 2022-03-09 NOTE — Telephone Encounter (Signed)
I did an EGD just 1 year ago No cancer, precancerous changes or H pylori infection For fam hx I do not think he needs egd repeated Is he having specific upper GI symptoms to warrant repeat at this time Thanks

## 2022-03-14 NOTE — Telephone Encounter (Signed)
Dr. Hilarie Fredrickson,  I spoke with patient and informed him of your recommendation.  He states he is not having any new issues at this time; he is doing well off his Protonix and is continuing the Vitamin B12. He is fine proceeding with the colonoscopy only.  Thanks, [employer]

## 2022-04-18 ENCOUNTER — Encounter: Payer: Self-pay | Admitting: Internal Medicine

## 2022-04-19 HISTORY — PX: COLONOSCOPY: SHX174

## 2022-04-21 ENCOUNTER — Encounter: Payer: Self-pay | Admitting: Internal Medicine

## 2022-04-21 ENCOUNTER — Ambulatory Visit (AMBULATORY_SURGERY_CENTER): Payer: Medicare Other | Admitting: Internal Medicine

## 2022-04-21 VITALS — BP 136/78 | HR 62 | Temp 97.5°F | Resp 14 | Ht 73.0 in | Wt 290.0 lb

## 2022-04-21 DIAGNOSIS — D122 Benign neoplasm of ascending colon: Secondary | ICD-10-CM | POA: Diagnosis not present

## 2022-04-21 DIAGNOSIS — G4730 Sleep apnea, unspecified: Secondary | ICD-10-CM | POA: Diagnosis not present

## 2022-04-21 DIAGNOSIS — D123 Benign neoplasm of transverse colon: Secondary | ICD-10-CM | POA: Diagnosis not present

## 2022-04-21 DIAGNOSIS — Z86010 Personal history of colonic polyps: Secondary | ICD-10-CM | POA: Diagnosis not present

## 2022-04-21 DIAGNOSIS — E785 Hyperlipidemia, unspecified: Secondary | ICD-10-CM | POA: Diagnosis not present

## 2022-04-21 DIAGNOSIS — Z09 Encounter for follow-up examination after completed treatment for conditions other than malignant neoplasm: Secondary | ICD-10-CM | POA: Diagnosis not present

## 2022-04-21 DIAGNOSIS — I10 Essential (primary) hypertension: Secondary | ICD-10-CM | POA: Diagnosis not present

## 2022-04-21 MED ORDER — SODIUM CHLORIDE 0.9 % IV SOLN: 500.0000 mL | Freq: Once | INTRAVENOUS | Status: DC

## 2022-04-21 NOTE — Patient Instructions (Signed)
Please read handouts provided. Continue present medications. Await pathology results. Repeat colonoscopy in 3 years for screening.  YOU HAD AN ENDOSCOPIC PROCEDURE TODAY AT THE [facility]:   Refer to the procedure report that was given to you for any specific questions about what was found during the examination.  If the procedure report does not answer your questions, please call your gastroenterologist to clarify.  If you requested that your care partner not be given the details of your procedure findings, then the procedure report has been included in a sealed envelope for you to review at your convenience later.  YOU SHOULD EXPECT: Some feelings of bloating in the abdomen. Passage of more gas than usual.  Walking can help get rid of the air that was put into your GI tract during the procedure and reduce the bloating. If you had a lower endoscopy (such as a colonoscopy or flexible sigmoidoscopy) you may notice spotting of blood in your stool or on the toilet paper. If you underwent a bowel prep for your procedure, you may not have a normal bowel movement for a few days.  Please Note:  You might notice some irritation and congestion in your nose or some drainage.  This is from the oxygen used during your procedure.  There is no need for concern and it should clear up in a day or so.  SYMPTOMS TO REPORT IMMEDIATELY:  Following lower endoscopy (colonoscopy or flexible sigmoidoscopy):  Excessive amounts of blood in the stool  Significant tenderness or worsening of abdominal pains  Swelling of the abdomen that is new, acute  Fever of 100F or higher.   For urgent or emergent issues, a gastroenterologist can be reached at any hour by calling [phone]. Do not use MyChart messaging for urgent concerns.    DIET:  We do recommend a small meal at first, but then you may proceed to your regular diet.  Drink plenty of fluids but you should avoid alcoholic beverages for 24  hours.  ACTIVITY:  You should plan to take it easy for the rest of today and you should NOT DRIVE or use heavy machinery until tomorrow (because of the sedation medicines used during the test).    FOLLOW UP: Our staff will call the number listed on your records the next business day following your procedure.  We will call around 7:15- 8:00 am to check on you and address any questions or concerns that you may have regarding the information given to you following your procedure. If we do not reach you, we will leave a message.     If any biopsies were taken you will be contacted by phone or by letter within the next 1-3 weeks.  Please call us at [phone] if you have not heard about the biopsies in 3 weeks.    SIGNATURES/CONFIDENTIALITY: You and/or your care partner have signed paperwork which will be entered into your electronic medical record.  These signatures attest to the fact that that the information above on your After Visit Summary has been reviewed and is understood.  Full responsibility of the confidentiality of this discharge information lies with you and/or your care-partner. 

## 2022-04-21 NOTE — Op Note (Signed)
[facility] Patient Name: Jackson French Procedure Date: 04/21/2022 8:41 AM MRN: [id number] Endoscopist: Jerene Bears , MD Age: 71 Referring MD:  Date of Birth: [birth year] Gender: Male Account #: [id number] Procedure:                Colonoscopy Indications:              High risk colon cancer surveillance: Personal                            history of multiple adenomas (> 10 at exam 1 yr                            ago); last colonoscopy Sept 2023 Medicines:                Propofol per Anesthesia Procedure:                Pre-Anesthesia Assessment:                           - Prior to the procedure, a History and Physical                            was performed, and patient medications and                            allergies were reviewed. The patient's tolerance of                            previous anesthesia was also reviewed. The risks                            and benefits of the procedure and the sedation                            options and risks were discussed with the patient.                            All questions were answered, and informed consent                            was obtained. Prior Anticoagulants: The patient has                            taken no previous anticoagulant or antiplatelet                            agents. ASA Grade Assessment: II - A patient with                            mild systemic disease. After reviewing the risks                            and benefits, the patient was deemed in  satisfactory condition to undergo the procedure.                           After obtaining informed consent, the colonoscope                            was passed under direct vision. Throughout the                            procedure, the patient's blood pressure, pulse, and                            oxygen saturations were monitored continuously. The                            Olympus CF-HQ190L ([id number])  Colonoscope was                            introduced through the anus and advanced to the                            cecum, identified by appendiceal orifice and                            ileocecal valve. The colonoscopy was performed                            without difficulty. The patient tolerated the                            procedure well. The quality of the bowel                            preparation was good. The ileocecal valve,                            appendiceal orifice, and rectum were photographed. Scope In: 8:44:46 AM Scope Out: 9:07:05 AM Scope Withdrawal Time: 0 hours 16 minutes 50 seconds  Total Procedure Duration: 0 hours 22 minutes 19 seconds  Findings:                 The digital rectal exam was normal.                           A 3 mm polyp was found in the ascending colon. The                            polyp was sessile. The polyp was removed with a                            cold biopsy forceps. Resection and retrieval were                            complete.  A 6 mm polyp was found in the ascending colon. The                            polyp was sessile. The polyp was removed with a                            cold snare. Resection and retrieval were complete.                           A 4 mm polyp was found in the transverse colon. The                            polyp was sessile. The polyp was removed with a                            cold snare. Resection and retrieval were complete.                           A few small-mouthed diverticula were found in the                            hepatic flexure.                           Internal hemorrhoids were found during retroflexion. Complications:            No immediate complications. Estimated Blood Loss:     Estimated blood loss was minimal. Impression:               - One 3 mm polyp in the ascending colon, removed                            with a cold biopsy forceps. Resected  and retrieved.                           - One 6 mm polyp in the ascending colon, removed                            with a cold snare. Resected and retrieved.                           - One 4 mm polyp in the transverse colon, removed                            with a cold snare. Resected and retrieved.                           - Diverticulosis at the hepatic flexure.                           - Internal hemorrhoids. Recommendation:           - Patient has a contact number available for  emergencies. The signs and symptoms of potential                            delayed complications were discussed with the                            patient. Return to normal activities tomorrow.                            Written discharge instructions were provided to the                            patient.                           - Resume previous diet.                           - Continue present medications.                           - Await pathology results.                           - Repeat colonoscopy in 3 years for surveillance. Jerene Bears, MD 04/21/2022 9:11:48 AM This report has been signed electronically.

## 2022-04-21 NOTE — Progress Notes (Signed)
Called to room to assist during endoscopic procedure.  Patient ID and intended procedure confirmed with present staff. Received instructions for my participation in the procedure from the performing physician.  

## 2022-04-21 NOTE — Progress Notes (Signed)
Patient ID: Jackson French, male   DOB: [birth year], 71 y.o.   MRN: [id number]    GASTROENTEROLOGY PROCEDURE H&P NOTE   Primary Care Physician: Mackie Pai, PA-C    Reason for Procedure:  History of multiple, greater than 10, adenomatous colon polyps at colonoscopy 1 year ago  Plan:    Colonoscopy  Patient is appropriate for endoscopic procedure(s) in the ambulatory ([place]) setting.  The nature of the procedure, as well as the risks, benefits, and alternatives were carefully and thoroughly reviewed with the patient. Ample time for discussion and questions allowed. The patient understood, was satisfied, and agreed to proceed.     HPI: Jackson French is a 71 y.o. male who presents for surveillance colonoscopy.  Medical history as below.  Tolerated the prep.  No recent chest pain or shortness of breath.  No abdominal pain today.  Past Medical History:  Diagnosis Date   Allergy    Gout    Hyperlipidemia    Hypertension    Sleep apnea    no CPAP   Thyroid disease     Past Surgical History:  Procedure Laterality Date   APPENDECTOMY     CATARACT EXTRACTION, BILATERAL Bilateral    COLONOSCOPY     HYDROCELE EXCISION / REPAIR     THYROID SURGERY     TONSILLECTOMY     VASECTOMY      Prior to Admission medications   Medication Sig Start Date End Date Taking? Authorizing Provider  chlorthalidone (HYGROTON) 25 MG tablet TAKE 1 TABLET(25 MG) BY MOUTH DAILY 02/17/22  Yes Saguier, Percell Miller, PA-C  cyanocobalamin (VITAMIN B12) 1000 MCG tablet Take 1 tablet by mouth daily.   Yes [provider]  levothyroxine (SYNTHROID) 137 MCG tablet Take 137 mcg by mouth daily. 03/07/22  Yes [provider]  losartan (COZAAR) 50 MG tablet TAKE 1 TABLET(50 MG) BY MOUTH DAILY 02/28/22  Yes Saguier, Percell Miller, PA-C  simvastatin (ZOCOR) 20 MG tablet Take 1 tablet (20 mg total) by mouth daily. 12/13/21  Yes Saguier, Percell Miller, PA-C  albuterol (VENTOLIN HFA) 108 (90 Base) MCG/ACT inhaler INHALE 2  PUFFS INTO THE LUNGS EVERY 6 HOURS AS NEEDED Patient not taking: Reported on 03/09/2022 05/25/21   Saguier, Percell Miller, PA-C  COVID-19 mRNA bivalent vaccine, Moderna, (MODERNA COVID-19 BIVAL BOOSTER) 50 MCG/0.5ML injection Inject into the muscle. 05/20/21   Carlyle Basques, MD  fluticasone [facility]) 50 MCG/ACT nasal spray SPRAY 2 SPRAYS INTO EACH NOSTRIL EVERY DAY Patient not taking: Reported on 03/09/2022 03/01/21   Saguier, Percell Miller, PA-C  influenza vaccine adjuvanted (FLUAD) 0.5 ML injection Inject into the muscle. 05/20/21   Carlyle Basques, MD  levocetirizine (XYZAL) 5 MG tablet Take 1 tablet (5 mg total) by mouth every evening. Patient not taking: Reported on 04/21/2022 11/17/21   Saguier, Percell Miller, PA-C  pantoprazole (PROTONIX) 40 MG tablet TAKE 1 TABLET(40 MG) BY MOUTH DAILY Patient not taking: Reported on 03/09/2022 02/02/22   Saguier, Percell Miller, PA-C  sildenafil (VIAGRA) 50 MG tablet Take 1 tablet (50 mg total) by mouth daily as needed for erectile dysfunction. 11/17/21   Saguier, Percell Miller, PA-C    Current Outpatient Medications  Medication Sig Dispense Refill   chlorthalidone (HYGROTON) 25 MG tablet TAKE 1 TABLET(25 MG) BY MOUTH DAILY 90 tablet 0   cyanocobalamin (VITAMIN B12) 1000 MCG tablet Take 1 tablet by mouth daily.     levothyroxine (SYNTHROID) 137 MCG tablet Take 137 mcg by mouth daily.     losartan (COZAAR) 50 MG tablet TAKE 1 TABLET(50 MG) BY  MOUTH DAILY 90 tablet 2   simvastatin (ZOCOR) 20 MG tablet Take 1 tablet (20 mg total) by mouth daily. 90 tablet 1   albuterol (VENTOLIN HFA) 108 (90 Base) MCG/ACT inhaler INHALE 2 PUFFS INTO THE LUNGS EVERY 6 HOURS AS NEEDED (Patient not taking: Reported on 03/09/2022) 54 g 0   COVID-19 mRNA bivalent vaccine, Moderna, (MODERNA COVID-19 BIVAL BOOSTER) 50 MCG/0.5ML injection Inject into the muscle. 0.5 mL 0   fluticasone (FLONASE) 50 MCG/ACT nasal spray SPRAY 2 SPRAYS INTO EACH NOSTRIL EVERY DAY (Patient not taking: Reported on 03/09/2022) 48 mL 1   influenza  vaccine adjuvanted (FLUAD) 0.5 ML injection Inject into the muscle. 0.5 mL 0   levocetirizine (XYZAL) 5 MG tablet Take 1 tablet (5 mg total) by mouth every evening. (Patient not taking: Reported on 04/21/2022) 90 tablet 1   pantoprazole (PROTONIX) 40 MG tablet TAKE 1 TABLET(40 MG) BY MOUTH DAILY (Patient not taking: Reported on 03/09/2022) 30 tablet 3   sildenafil (VIAGRA) 50 MG tablet Take 1 tablet (50 mg total) by mouth daily as needed for erectile dysfunction. 10 tablet 0   Current Facility-Administered Medications  Medication Dose Route Frequency Provider Last Rate Last Admin   0.9 %  sodium chloride infusion  500 mL Intravenous Once Elmarie Shiley, MD        Allergies as of 04/21/2022   (No Known Allergies)    Family History  Problem Relation Age of Onset   Arthritis Mother    Breast cancer Mother    Stomach cancer Father 42       duodenal cancer   Heart attack Father    Cancer - Colon Neg Hx    Colon cancer Neg Hx    Rectal cancer Neg Hx    Esophageal cancer Neg Hx     Social History   Socioeconomic History   Marital status: Married    Spouse name: Not on file   Number of children: Not on file   Years of education: Not on file   Highest education level: Not on file  Occupational History   Not on file  Tobacco Use   Smoking status: Never   Smokeless tobacco: Never  Vaping Use   Vaping Use: Never used  Substance and Sexual Activity   Alcohol use: Yes    Comment: rarely   Drug use: Never   Sexual activity: Yes  Other Topics Concern   Not on file  Social History Narrative   Not on file   Social Determinants of Health   Financial Resource Strain: Low Risk  (06/08/2021)   Overall Financial Resource Strain (CARDIA)    Difficulty of Paying Living Expenses: Not hard at all  Food Insecurity: No Food Insecurity (06/08/2021)   Hunger Vital Sign    Worried About Running Out of Food in the Last Year: Never true    Ran Out of Food in the Last Year: Never true   Transportation Needs: No Transportation Needs (06/08/2021)   PRAPARE - Hydrologist (Medical): No    Lack of Transportation (Non-Medical): No  Physical Activity: Inactive (06/08/2021)   Exercise Vital Sign    Days of Exercise per Week: 0 days    Minutes of Exercise per Session: 0 min  Stress: No Stress Concern Present (06/08/2021)   [place]    Feeling of Stress : Not at all  Social Connections: Moderately Isolated (06/08/2021)   Social Connection and Isolation  Panel [NHANES]    Frequency of Communication with Friends and Family: More than three times a week    Frequency of Social Gatherings with Friends and Family: More than three times a week    Attends Religious Services: Never    [name] or Organizations: No    Attends Archivist Meetings: Never    Marital Status: Married  Human resources officer Violence: Not At Risk (06/08/2021)   Humiliation, Afraid, Rape, and Kick questionnaire    Fear of Current or Ex-Partner: No    Emotionally Abused: No    Physically Abused: No    Sexually Abused: No    Physical Exam: Vital signs in last 24 hours: '@BP'$  111/68   Pulse 83   Temp (!) 97.5 F (36.4 C)   Ht '6\' 1"'$  (1.854 m)   Wt 290 lb (131.5 kg)   SpO2 97%   BMI 38.26 kg/m  GEN: NAD EYE: Sclerae anicteric ENT: MMM CV: Non-tachycardic Pulm: CTA b/l GI: Soft, NT/ND NEURO:  Alert & Oriented x 3   Zenovia Jarred, MD [facility]  04/21/2022 8:37 AM

## 2022-04-21 NOTE — Progress Notes (Signed)
PT taken to PACU. Monitors in place. VSS. Report given to RN. 

## 2022-04-22 ENCOUNTER — Telehealth: Payer: Self-pay

## 2022-04-22 NOTE — Telephone Encounter (Signed)
  Follow up Call-     04/21/2022    7:31 AM 04/23/2021    7:58 AM  Call back number  Post procedure Call Back phone  # [phone]  Permission to leave phone message Yes Yes     Patient questions:  Do you have a fever, pain , or abdominal swelling? No. Pain Score  0 *  Have you tolerated food without any problems? Yes.    Have you been able to return to your normal activities? Yes.    Do you have any questions about your discharge instructions: Diet   No. Medications  No. Follow up visit  No.  Do you have questions or concerns about your Care? No.  Actions: * If pain score is 4 or above: No action needed, pain <4.

## 2022-04-26 ENCOUNTER — Encounter: Payer: Self-pay | Admitting: Internal Medicine

## 2022-04-26 ENCOUNTER — Encounter: Payer: Self-pay | Admitting: Pulmonary Disease

## 2022-04-26 ENCOUNTER — Ambulatory Visit (INDEPENDENT_AMBULATORY_CARE_PROVIDER_SITE_OTHER): Payer: Medicare Other | Admitting: Pulmonary Disease

## 2022-04-26 VITALS — BP 122/78 | HR 78 | Temp 98.0°F | Ht 73.0 in | Wt 283.0 lb

## 2022-04-26 DIAGNOSIS — G4719 Other hypersomnia: Secondary | ICD-10-CM

## 2022-04-26 DIAGNOSIS — R0683 Snoring: Secondary | ICD-10-CM

## 2022-04-26 NOTE — Patient Instructions (Signed)
Continue weight loss efforts  I will see you back in about 6 months  Call with significant concerns  Sleep Apnea Sleep apnea affects breathing during sleep. It causes breathing to stop for 10 seconds or more, or to become shallow. People with sleep apnea usually snore loudly. It can also increase the risk of: Heart attack. Stroke. Being very overweight (obese). Diabetes. Heart failure. Irregular heartbeat. High blood pressure. The goal of treatment is to help you breathe normally again. What are the causes?  The most common cause of this condition is a collapsed or blocked airway. There are three kinds of sleep apnea: Obstructive sleep apnea. This is caused by a blocked or collapsed airway. Central sleep apnea. This happens when the brain does not send the right signals to the muscles that control breathing. Mixed sleep apnea. This is a combination of obstructive and central sleep apnea. What increases the risk? Being overweight. Smoking. Having a small airway. Being older. Being male. Drinking alcohol. Taking medicines to calm yourself (sedatives or tranquilizers). Having family members with the condition. Having a tongue or tonsils that are larger than normal. What are the signs or symptoms? Trouble staying asleep. Loud snoring. Headaches in the morning. Waking up gasping. Dry mouth or sore throat in the morning. Being sleepy or tired during the day. If you are sleepy or tired during the day, you may also: Not be able to focus your mind (concentrate). Forget things. Get angry a lot and have mood swings. Feel sad (depressed). Have changes in your personality. Have less interest in sex, if you are male. Be unable to have an erection, if you are male. How is this treated?  Sleeping on your side. Using a medicine to get rid of mucus in your nose (decongestant). Avoiding the use of alcohol, medicines to help you relax, or certain pain medicines (narcotics). Losing  weight, if needed. Changing your diet. Quitting smoking. Using a machine to open your airway while you sleep, such as: An oral appliance. This is a mouthpiece that shifts your lower jaw forward. A CPAP device. This device blows air through a mask when you breathe out (exhale). An EPAP device. This has valves that you put in each nostril. A BIPAP device. This device blows air through a mask when you breathe in (inhale) and breathe out. Having surgery if other treatments do not work. Follow these instructions at home: Lifestyle Make changes that your doctor recommends. Eat a healthy diet. Lose weight if needed. Avoid alcohol, medicines to help you relax, and some pain medicines. Do not smoke or use any products that contain nicotine or tobacco. If you need help quitting, ask your doctor. General instructions Take over-the-counter and prescription medicines only as told by your doctor. If you were given a machine to use while you sleep, use it only as told by your doctor. If you are having surgery, make sure to tell your doctor you have sleep apnea. You may need to bring your device with you. Keep all follow-up visits. Contact a doctor if: The machine that you were given to use during sleep bothers you or does not seem to be working. You do not get better. You get worse. Get help right away if: Your chest hurts. You have trouble breathing in enough air. You have an uncomfortable feeling in your back, arms, or stomach. You have trouble talking. One side of your body feels weak. A part of your face is hanging down. These symptoms may be an emergency.  Get help right away. Call your local emergency services (911 in the [place] Do not wait to see if the symptoms will go away. Do not drive yourself to the hospital. Summary This condition affects breathing during sleep. The most common cause is a collapsed or blocked airway. The goal of treatment is to help you breathe normally while you  sleep. This information is not intended to replace advice given to you by your health care provider. Make sure you discuss any questions you have with your health care provider. Document Revised: 03/03/2021 Document Reviewed: 07/03/2020 Elsevier Patient Education  [place].

## 2022-04-26 NOTE — Progress Notes (Signed)
Jackson French    [id number]    Jul 02, 1951  Primary Care Physician:Saguier, Iris Pert  Referring Physician: Mackie Pai, PA-C [address] [place],  [place] [address]  Chief complaint:   Evaluation for sleep apnea  HPI:  Diagnosed with obstructive sleep about 2025 years ago Did have ENT surgery for treatment-this did not help Did try CPAP for about a year and CPAP was not tolerated-mask issues, pressure issues -Was not able to get sleep at all  Has been focusing on weight loss and has managed to drop about 30 pounds recently to [place] efforts started walking more, has started Weight training as well.  Usually goes to bed about 9 PM, wake up time about 5 AM Takes him about 15 minutes to fall asleep, a couple of awakenings Dryness of his mouth in the morning, snoring is less according to his spouse with his weight loss Still does wake up tired, wakes up with a dry mouth, no morning headaches Memory is good He does have some daytime sleepiness  History of hypertension, hypercholesterolemia, prediabetes  Chronic hypothyroidism, thyroidectomy at age about 79  No pets  Outpatient Encounter Medications as of 04/26/2022  Medication Sig   chlorthalidone (HYGROTON) 25 MG tablet TAKE 1 TABLET(25 MG) BY MOUTH DAILY   cyanocobalamin (VITAMIN B12) 1000 MCG tablet Take 1 tablet by mouth daily.   levocetirizine (XYZAL) 5 MG tablet Take 1 tablet (5 mg total) by mouth every evening. (Patient taking differently: Take 5 mg by mouth as needed for allergies.)   levothyroxine (SYNTHROID) 137 MCG tablet Take 137 mcg by mouth daily.   losartan (COZAAR) 50 MG tablet TAKE 1 TABLET(50 MG) BY MOUTH DAILY   sildenafil (VIAGRA) 50 MG tablet Take 1 tablet (50 mg total) by mouth daily as needed for erectile dysfunction.   simvastatin (ZOCOR) 20 MG tablet Take 1 tablet (20 mg total) by mouth daily.   [DISCONTINUED] albuterol (VENTOLIN HFA) 108 (90 Base) MCG/ACT inhaler  INHALE 2 PUFFS INTO THE LUNGS EVERY 6 HOURS AS NEEDED (Patient not taking: Reported on 03/09/2022)   [DISCONTINUED] COVID-19 mRNA bivalent vaccine, Moderna, (MODERNA COVID-19 BIVAL BOOSTER) 50 MCG/0.5ML injection Inject into the muscle. (Patient not taking: Reported on 04/26/2022)   [DISCONTINUED] fluticasone (FLONASE) 50 MCG/ACT nasal spray SPRAY 2 SPRAYS INTO EACH NOSTRIL EVERY DAY (Patient not taking: Reported on 03/09/2022)   [DISCONTINUED] influenza vaccine adjuvanted (FLUAD) 0.5 ML injection Inject into the muscle. (Patient not taking: Reported on 04/26/2022)   [DISCONTINUED] pantoprazole (PROTONIX) 40 MG tablet TAKE 1 TABLET(40 MG) BY MOUTH DAILY (Patient not taking: Reported on 03/09/2022)   No facility-administered encounter medications on file as of 04/26/2022.    Allergies as of 04/26/2022   (No Known Allergies)    Past Medical History:  Diagnosis Date   Allergy    Gout    Hyperlipidemia    Hypertension    Sleep apnea    no CPAP   Thyroid disease     Past Surgical History:  Procedure Laterality Date   APPENDECTOMY     CATARACT EXTRACTION, BILATERAL Bilateral    COLONOSCOPY  04/19/2022   HYDROCELE EXCISION / REPAIR     THYROID SURGERY     TONSILLECTOMY     VASECTOMY      Family History  Problem Relation Age of Onset   Arthritis Mother    Breast cancer Mother    Stomach cancer Father 32  duodenal cancer   Heart attack Father    Cancer - Colon Neg Hx    Colon cancer Neg Hx    Rectal cancer Neg Hx    Esophageal cancer Neg Hx     Social History   Socioeconomic History   Marital status: Married    Spouse name: Not on file   Number of children: Not on file   Years of education: Not on file   Highest education level: Not on file  Occupational History   Not on file  Tobacco Use   Smoking status: Never   Smokeless tobacco: Never  Vaping Use   Vaping Use: Never used  Substance and Sexual Activity   Alcohol use: Yes    Comment: rarely   Drug use: Never    Sexual activity: Yes  Other Topics Concern   Not on file  Social History Narrative   Not on file   Social Determinants of Health   Financial Resource Strain: Low Risk  (06/08/2021)   Overall Financial Resource Strain (CARDIA)    Difficulty of Paying Living Expenses: Not hard at all  Food Insecurity: No Food Insecurity (06/08/2021)   Hunger Vital Sign    Worried About Running Out of Food in the Last Year: Never true    [place] in the Last Year: Never true  Transportation Needs: No Transportation Needs (06/08/2021)   PRAPARE - Hydrologist (Medical): No    Lack of Transportation (Non-Medical): No  Physical Activity: Inactive (06/08/2021)   Exercise Vital Sign    Days of Exercise per Week: 0 days    Minutes of Exercise per Session: 0 min  Stress: No Stress Concern Present (06/08/2021)   [place]    Feeling of Stress : Not at all  Social Connections: Moderately Isolated (06/08/2021)   Social Connection and Isolation Panel [NHANES]    Frequency of Communication with Friends and Family: More than three times a week    Frequency of Social Gatherings with Friends and Family: More than three times a week    Attends Religious Services: Never    [name] or Organizations: No    Attends Archivist Meetings: Never    Marital Status: Married  Human resources officer Violence: Not At Risk (06/08/2021)   Humiliation, Afraid, Rape, and Kick questionnaire    Fear of Current or Ex-Partner: No    Emotionally Abused: No    Physically Abused: No    Sexually Abused: No    Review of Systems  Constitutional:  Negative for fatigue.  Respiratory:  Positive for apnea.   Psychiatric/Behavioral:  Positive for sleep disturbance.     Vitals:   04/26/22 1020  BP: 122/78  Pulse: 78  Temp: 98 F (36.7 C)  SpO2: 96%     Physical Exam Constitutional:      Appearance: He is obese.   HENT:     Head: Normocephalic.     Mouth/Throat:     Mouth: Mucous membranes are moist.     Comments: Crowded oropharynx, Mallampati 3 Eyes:     Pupils: Pupils are equal, round, and reactive to light.  Cardiovascular:     Rate and Rhythm: Normal rate and regular rhythm.     Heart sounds: No murmur heard. Pulmonary:     Effort: No respiratory distress.     Breath sounds: No stridor. No wheezing or rhonchi.  Musculoskeletal:  Cervical back: No rigidity or tenderness.  Neurological:     Mental Status: He is alert.  Psychiatric:        Mood and Affect: Mood normal.       04/26/2022   10:00 AM  Results of the Epworth flowsheet  Sitting and reading 2  Watching TV 2  Sitting, inactive in a public place (e.g. a theatre or a meeting) 1  As a passenger in a car for an hour without a break 0  Lying down to rest in the afternoon when circumstances permit 2  Sitting and talking to someone 0  Sitting quietly after a lunch without alcohol 1  In a car, while stopped for a few minutes in traffic 0  Total score 8   Data Reviewed: Previous sleep study not available - Remembers having a diagnosis of severe obstructive sleep apnea  Assessment:  History of obstructive sleep apnea  Class II obesity  Hypothyroidism  Excessive daytime sleepiness  Pathophysiology of sleep disordered breathing discussed Treatment options discussed  Intolerance to CPAP in the past    Plan/Recommendations: Options of treatment discussed  An inspire device may be an option of treatment but his weight still precludes him at present  He has noted about 30 pound weight loss recently with current efforts and he has stepped up his efforts He will continue weight loss efforts  Encouraged to call with any concerns  First step will be a repeat sleep study to ascertain severity of sleep disordered breathing  Tentative follow-up appointment will be made for 6 months   Sherrilyn Rist MD [facility] 04/26/2022, 10:46 AM  CC: Mackie Pai, PA-C

## 2022-04-28 ENCOUNTER — Encounter: Payer: Self-pay | Admitting: Medical

## 2022-04-28 MED ORDER — LEVOCETIRIZINE DIHYDROCHLORIDE 5 MG PO TABS: 5.0000 mg | ORAL_TABLET | Freq: Every evening | ORAL | 0 refills | Status: DC

## 2022-05-09 DIAGNOSIS — E039 Hypothyroidism, unspecified: Secondary | ICD-10-CM | POA: Diagnosis not present

## 2022-05-10 ENCOUNTER — Encounter: Payer: Self-pay | Admitting: Medical

## 2022-05-11 NOTE — Addendum Note (Signed)
Addended by: Anabel Halon on: 05/11/2022 03:19 PM   Modules accepted: Orders

## 2022-05-18 ENCOUNTER — Encounter: Payer: Self-pay | Admitting: Medical

## 2022-05-18 MED ORDER — CHLORTHALIDONE 25 MG PO TABS: ORAL_TABLET | ORAL | 0 refills | Status: DC

## 2022-05-20 ENCOUNTER — Other Ambulatory Visit (HOSPITAL_BASED_OUTPATIENT_CLINIC_OR_DEPARTMENT_OTHER): Payer: Self-pay

## 2022-05-20 DIAGNOSIS — Z23 Encounter for immunization: Secondary | ICD-10-CM | POA: Diagnosis not present

## 2022-05-20 MED ORDER — COMIRNATY 30 MCG/0.3ML IM SUSP
INTRAMUSCULAR | 0 refills | Status: DC
  Filled 2022-05-20: qty 0.3, 1d supply, fill #0

## 2022-05-20 MED ORDER — FLUAD QUADRIVALENT 0.5 ML IM PRSY
PREFILLED_SYRINGE | INTRAMUSCULAR | 0 refills | Status: DC
  Filled 2022-05-20: qty 0.5, 1d supply, fill #0

## 2022-05-24 ENCOUNTER — Other Ambulatory Visit (INDEPENDENT_AMBULATORY_CARE_PROVIDER_SITE_OTHER): Payer: Medicare Other

## 2022-05-24 ENCOUNTER — Encounter: Payer: Self-pay | Admitting: Medical

## 2022-05-24 DIAGNOSIS — I10 Essential (primary) hypertension: Secondary | ICD-10-CM

## 2022-05-24 DIAGNOSIS — E039 Hypothyroidism, unspecified: Secondary | ICD-10-CM

## 2022-05-24 DIAGNOSIS — E538 Deficiency of other specified B group vitamins: Secondary | ICD-10-CM

## 2022-05-24 LAB — T4, FREE: Free T4: 1.32 ng/dL (ref 0.60–1.60)

## 2022-05-24 LAB — COMPREHENSIVE METABOLIC PANEL
ALT: 28 U/L (ref 0–53)
AST: 26 U/L (ref 0–37)
Albumin: 4.4 g/dL (ref 3.5–5.2)
Alkaline Phosphatase: 60 U/L (ref 39–117)
BUN: 17 mg/dL (ref 6–23)
CO2: 32 mEq/L (ref 19–32)
Calcium: 9.3 mg/dL (ref 8.4–10.5)
Chloride: 97 mEq/L (ref 96–112)
Creatinine, Ser: 1.13 mg/dL (ref 0.40–1.50)
GFR: 65.37 mL/min (ref >60.00)
Glucose, Bld: 101 mg/dL — ABNORMAL HIGH (ref 70–99)
Potassium: 3.2 mEq/L — ABNORMAL LOW (ref 3.5–5.1)
Sodium: 137 mEq/L (ref 135–145)
Total Bilirubin: 0.8 mg/dL (ref 0.2–1.2)
Total Protein: 6.5 g/dL (ref 6.0–8.3)

## 2022-05-24 LAB — TSH: TSH: 1.37 u[IU]/mL (ref 0.35–5.50)

## 2022-05-24 LAB — VITAMIN B12: Vitamin B-12: 335 pg/mL (ref 211–911)

## 2022-05-25 MED ORDER — POTASSIUM CHLORIDE CRYS ER 10 MEQ PO TBCR: 10.0000 meq | EXTENDED_RELEASE_TABLET | Freq: Every day | ORAL | 0 refills | Status: DC

## 2022-05-25 NOTE — Addendum Note (Signed)
Addended by: Anabel Halon on: 05/25/2022 10:13 AM   Modules accepted: Orders

## 2022-05-25 NOTE — Addendum Note (Signed)
Addended by: Anabel Halon on: 05/25/2022 11:58 AM   Modules accepted: Orders

## 2022-05-31 ENCOUNTER — Encounter: Payer: Self-pay | Admitting: Medical

## 2022-05-31 MED ORDER — LEVOTHYROXINE SODIUM 137 MCG PO TABS: 137.0000 ug | ORAL_TABLET | Freq: Every day | ORAL | 0 refills | Status: DC

## 2022-06-14 ENCOUNTER — Ambulatory Visit (INDEPENDENT_AMBULATORY_CARE_PROVIDER_SITE_OTHER): Payer: Medicare Other | Admitting: Medical

## 2022-06-14 ENCOUNTER — Ambulatory Visit (INDEPENDENT_AMBULATORY_CARE_PROVIDER_SITE_OTHER): Payer: Medicare Other | Admitting: *Deleted

## 2022-06-14 ENCOUNTER — Other Ambulatory Visit: Payer: Self-pay | Admitting: Medical

## 2022-06-14 VITALS — BP 113/72 | HR 76 | Ht 73.0 in | Wt 274.6 lb

## 2022-06-14 VITALS — BP 113/72 | HR 76 | Temp 98.2°F | Resp 18 | Ht 73.0 in | Wt 274.0 lb

## 2022-06-14 DIAGNOSIS — I10 Essential (primary) hypertension: Secondary | ICD-10-CM

## 2022-06-14 DIAGNOSIS — Z0000 Encounter for general adult medical examination without abnormal findings: Secondary | ICD-10-CM

## 2022-06-14 DIAGNOSIS — R739 Hyperglycemia, unspecified: Secondary | ICD-10-CM | POA: Diagnosis not present

## 2022-06-14 DIAGNOSIS — E876 Hypokalemia: Secondary | ICD-10-CM | POA: Diagnosis not present

## 2022-06-14 DIAGNOSIS — E538 Deficiency of other specified B group vitamins: Secondary | ICD-10-CM | POA: Diagnosis not present

## 2022-06-14 DIAGNOSIS — E785 Hyperlipidemia, unspecified: Secondary | ICD-10-CM

## 2022-06-14 LAB — LIPID PANEL
Cholesterol: 113 mg/dL (ref 0–200)
HDL: 33.4 mg/dL — ABNORMAL LOW (ref >39.00)
LDL Cholesterol: 49 mg/dL (ref 0–99)
NonHDL: 79.91
Total CHOL/HDL Ratio: 3
Triglycerides: 156 mg/dL — ABNORMAL HIGH (ref 0.0–149.0)
VLDL: 31.2 mg/dL (ref 0.0–40.0)

## 2022-06-14 LAB — COMPREHENSIVE METABOLIC PANEL
ALT: 27 U/L (ref 0–53)
AST: 24 U/L (ref 0–37)
Albumin: 4.5 g/dL (ref 3.5–5.2)
Alkaline Phosphatase: 64 U/L (ref 39–117)
BUN: 17 mg/dL (ref 6–23)
CO2: 32 mEq/L (ref 19–32)
Calcium: 9.6 mg/dL (ref 8.4–10.5)
Chloride: 98 mEq/L (ref 96–112)
Creatinine, Ser: 1.1 mg/dL (ref 0.40–1.50)
GFR: 67.48 mL/min (ref >60.00)
Glucose, Bld: 99 mg/dL (ref 70–99)
Potassium: 3.8 mEq/L (ref 3.5–5.1)
Sodium: 139 mEq/L (ref 135–145)
Total Bilirubin: 0.9 mg/dL (ref 0.2–1.2)
Total Protein: 6.6 g/dL (ref 6.0–8.3)

## 2022-06-14 LAB — HEMOGLOBIN A1C: Hgb A1c MFr Bld: 5.4 % (ref 4.6–6.5)

## 2022-06-14 MED ORDER — SIMVASTATIN 20 MG PO TABS: 20.0000 mg | ORAL_TABLET | Freq: Every day | ORAL | 3 refills | Status: DC

## 2022-06-14 NOTE — Progress Notes (Signed)
Subjective:    Patient ID: Jackson French, male    DOB: 1950/12/07, 71 y.o.   MRN: [id number]  HPI  Pt in for follow up.  Pt has low b12 in past. Pt is trying to take 2000-3000 mcg daily dose.   Pt remember/discussed. He was taking only 1000 mcg. I had advised 5000 mcg daily.   Last labs below.  "Your potassium is mild low. Will prescribe low dose k tabs to take for one week. The diuretic you are taking can drop potassium so try to eat potassium rich diet. Sugar mild elevated at time of the lab. Your metabolic panel shows good kidney function and normal liver enzymes. Thyroid studies normal. Your b12 level did not come up much but is improved. You can continue over the counter b12. I usually recommend taking 5000 mcg daily dose. Did you take that amount or was it lower amount? I think better to be in me to upper 1/3 range. If you want to do injection b12 let me know."  Pt has lost 35 lbs working out and eating healthier.   Bp has decreased since he lost weight. Pt now notes when he bends over and stands up will get dizzy/light headed.  Pt wants to use just losartan 50 mg daily. He wants to stop the chlorthalidone.   High cholesterol- needs refill of simvastatin.  He is fasting.  Recent low potassium on last labs. Took 7 tabs of potassium.    Review of Systems  Constitutional:  Negative for chills, fatigue and fever.  Respiratory:  Negative for cough, chest tightness, shortness of breath and wheezing.   Cardiovascular:  Negative for chest pain and palpitations.  Gastrointestinal:  Negative for abdominal pain, blood in stool, diarrhea and vomiting.  Genitourinary:  Negative for dysuria, flank pain and frequency.  Musculoskeletal:  Negative for back pain, joint swelling, neck pain and neck stiffness.  Skin:  Negative for rash.  Neurological:  Negative for dizziness, tremors, speech difficulty, numbness and headaches.  Hematological:  Negative for adenopathy. Does not bruise/bleed  easily.  Psychiatric/Behavioral:  Negative for behavioral problems and decreased concentration.     Past Medical History:  Diagnosis Date   Allergy    Gout    Hyperlipidemia    Hypertension    Sleep apnea    no CPAP   Thyroid disease      Social History   Socioeconomic History   Marital status: Married    Spouse name: Not on file   Number of children: Not on file   Years of education: Not on file   Highest education level: Not on file  Occupational History   Not on file  Tobacco Use   Smoking status: Never   Smokeless tobacco: Never  Vaping Use   Vaping Use: Never used  Substance and Sexual Activity   Alcohol use: Yes    Comment: rarely   Drug use: Never   Sexual activity: Yes  Other Topics Concern   Not on file  Social History Narrative   Not on file   Social Determinants of Health   Financial Resource Strain: Low Risk  (06/08/2021)   Overall Financial Resource Strain (CARDIA)    Difficulty of Paying Living Expenses: Not hard at all  Food Insecurity: No Food Insecurity (06/14/2022)   Hunger Vital Sign    Worried About Running Out of Food in the Last Year: Never true    Ran Out of Food in the Last Year: Never true  Transportation Needs: No Transportation Needs (06/14/2022)   PRAPARE - Hydrologist (Medical): No    Lack of Transportation (Non-Medical): No  Physical Activity: Sufficiently Active (06/14/2022)   Exercise Vital Sign    Days of Exercise per Week: 4 days    Minutes of Exercise per Session: 60 min  Stress: No Stress Concern Present (06/08/2021)   [place]    Feeling of Stress : Not at all  Social Connections: Moderately Isolated (06/08/2021)   Social Connection and Isolation Panel [NHANES]    Frequency of Communication with Friends and Family: More than three times a week    Frequency of Social Gatherings with Friends and Family: More than three times a  week    Attends Religious Services: Never    [name] or Organizations: No    Attends Archivist Meetings: Never    Marital Status: Married  Human resources officer Violence: Not At Risk (06/14/2022)   Humiliation, Afraid, Rape, and Kick questionnaire    Fear of Current or Ex-Partner: No    Emotionally Abused: No    Physically Abused: No    Sexually Abused: No    Past Surgical History:  Procedure Laterality Date   APPENDECTOMY     CATARACT EXTRACTION, BILATERAL Bilateral    COLONOSCOPY  04/19/2022   HYDROCELE EXCISION / REPAIR     THYROID SURGERY     TONSILLECTOMY     VASECTOMY      Family History  Problem Relation Age of Onset   Arthritis Mother    Breast cancer Mother    Stomach cancer Father 70       duodenal cancer   Heart attack Father    Cancer - Colon Neg Hx    Colon cancer Neg Hx    Rectal cancer Neg Hx    Esophageal cancer Neg Hx     No Known Allergies  Current Outpatient Medications on File Prior to Visit  Medication Sig Dispense Refill   chlorthalidone (HYGROTON) 25 MG tablet TAKE 1 TABLET(25 MG) BY MOUTH DAILY 90 tablet 0   cyanocobalamin (VITAMIN B12) 1000 MCG tablet Take 1 tablet by mouth daily.     levocetirizine (XYZAL) 5 MG tablet Take 1 tablet (5 mg total) by mouth every evening. 90 tablet 0   levothyroxine (SYNTHROID) 137 MCG tablet Take 1 tablet (137 mcg total) by mouth daily before breakfast. 90 tablet 0   losartan (COZAAR) 50 MG tablet TAKE 1 TABLET(50 MG) BY MOUTH DAILY 90 tablet 2   potassium chloride (KLOR-CON M) 10 MEQ tablet Take 1 tablet (10 mEq total) by mouth daily. 7 tablet 0   sildenafil (VIAGRA) 50 MG tablet Take 1 tablet (50 mg total) by mouth daily as needed for erectile dysfunction. 10 tablet 0   simvastatin (ZOCOR) 20 MG tablet Take 1 tablet (20 mg total) by mouth daily. 90 tablet 1   No current facility-administered medications on file prior to visit.    BP 113/72   Pulse 76   Temp 98.2 F (36.8 C)   Resp  18   Ht '6\' 1"'$  (1.854 m)   Wt 274 lb (124.3 kg)   SpO2 98%   BMI 36.15 kg/m        Objective:   Physical Exam  General Mental Status- Alert. General Appearance- Not in acute distress.   Skin General: Color- Normal Color. Moisture- Normal Moisture.  Neck Carotid Arteries- Normal color.  Moisture- Normal Moisture. No carotid bruits. No JVD.  Chest and Lung Exam Auscultation: Breath Sounds:-Normal.  Cardiovascular Auscultation:Rythm- Regular. Murmurs & Other Heart Sounds:Auscultation of the heart reveals- No Murmurs.  Abdomen Inspection:-Inspeection Normal. Palpation/Percussion:Note:No mass. Palpation and Percussion of the abdomen reveal- Non Tender, Non Distended + BS, no rebound or guarding.  Neurologic Cranial Nerve exam:- CN III-XII intact(No nystagmus), symmetric smile. Strength:- 5/5 equal and symmetric strength both upper and lower extremities.       Assessment & Plan:   Patient Instructions  Hypertension- your bp is very tightly controlled/low and you get dizzy on position change. Continue losartan and stop chlorthalidone.  Keep checking bp and let me know bp readings at home in 7-10 days  B12 lower end. Can continue 3000 mcg daily otc dose. Repeat b12 level in 2 months.  Low potassium recently. Recheck cmp today.  High cholesterol- recheck lipid panel today. Continue simvastatin.  For elevated sugar get A1c.  Follow up date to be determined after lab review. 6 months if labs all good.    Mackie Pai, PA-C

## 2022-06-14 NOTE — Addendum Note (Signed)
Addended by: Anabel Halon on: 06/14/2022 10:10 AM   Modules accepted: Orders

## 2022-06-14 NOTE — Patient Instructions (Addendum)
Hypertension- your bp is very tightly controlled/low and you get dizzy on position change. Continue losartan and stop chlorthalidone.  Keep checking bp and let me know bp readings at home in 7-10 days  B12 lower end. Can continue 3000 mcg daily otc dose. Repeat b12 level in 2 months.  Low potassium recently. Recheck cmp today.  High cholesterol- recheck lipid panel today. Continue simvastatin.  For elevated sugar get A1c.  Follow up date to be determined after lab review. 6 months if labs all good.

## 2022-06-14 NOTE — Addendum Note (Signed)
Addended by: Anabel Halon on: 06/14/2022 09:07 PM   Modules accepted: Orders

## 2022-06-14 NOTE — Patient Instructions (Signed)
Jackson French , Thank you for taking time to come for your Medicare Wellness Visit. I appreciate your ongoing commitment to your health goals. Please review the following plan we discussed and let me know if I can assist you in the future.   These are the goals we discussed:  Goals      Patient Stated     Drink more water & eat healthier        This is a list of the screening recommended for you and due dates:  Health Maintenance  Topic Date Due   Hepatitis C Screening: USPSTF Recommendation to screen - Ages 61-79 yo.  Never done   Zoster (Shingles) Vaccine (1 of 2) Never done   Pneumonia Vaccine (1 - PCV) Never done   COVID-19 Vaccine (4 - Moderna series) 09/20/2022   Medicare Annual Wellness Visit  06/15/2023   Colon Cancer Screening  04/21/2025   Tetanus Vaccine  01/19/2027   Flu Shot  Completed   HPV Vaccine  Aged Out     Next appointment: Follow up in one year for your annual wellness visit.   Preventive Care 100 Years and Older, Male Preventive care refers to lifestyle choices and visits with your health care provider that can promote health and wellness. What does preventive care include? A yearly physical exam. This is also called an annual well check. Dental exams once or twice a year. Routine eye exams. Ask your health care provider how often you should have your eyes checked. Personal lifestyle choices, including: Daily care of your teeth and gums. Regular physical activity. Eating a healthy diet. Avoiding tobacco and drug use. Limiting alcohol use. Practicing safe sex. Taking low doses of aspirin every day. Taking vitamin and mineral supplements as recommended by your health care provider. What happens during an annual well check? The services and screenings done by your health care provider during your annual well check will depend on your age, overall health, lifestyle risk factors, and family history of disease. Counseling  Your health care provider may ask  you questions about your: Alcohol use. Tobacco use. Drug use. Emotional well-being. Home and relationship well-being. Sexual activity. Eating habits. History of falls. Memory and ability to understand (cognition). Work and work Statistician. Screening  You may have the following tests or measurements: Height, weight, and BMI. Blood pressure. Lipid and cholesterol levels. These may be checked every 5 years, or more frequently if you are over 60 years old. Skin check. Lung cancer screening. You may have this screening every year starting at age 74 if you have a 30-pack-year history of smoking and currently smoke or have quit within the past 15 years. Fecal occult blood test (FOBT) of the stool. You may have this test every year starting at age 46. Flexible sigmoidoscopy or colonoscopy. You may have a sigmoidoscopy every 5 years or a colonoscopy every 10 years starting at age 40. Prostate cancer screening. Recommendations will vary depending on your family history and other risks. Hepatitis C blood test. Hepatitis B blood test. Sexually transmitted disease (STD) testing. Diabetes screening. This is done by checking your blood sugar (glucose) after you have not eaten for a while (fasting). You may have this done every 1-3 years. Abdominal aortic aneurysm (AAA) screening. You may need this if you are a current or former smoker. Osteoporosis. You may be screened starting at age 44 if you are at high risk. Talk with your health care provider about your test results, treatment options, and if  necessary, the need for more tests. Vaccines  Your health care provider may recommend certain vaccines, such as: Influenza vaccine. This is recommended every year. Tetanus, diphtheria, and acellular pertussis (Tdap, Td) vaccine. You may need a Td booster every 10 years. Zoster vaccine. You may need this after age 85. Pneumococcal 13-valent conjugate (PCV13) vaccine. One dose is recommended after age  25. Pneumococcal polysaccharide (PPSV23) vaccine. One dose is recommended after age 28. Talk to your health care provider about which screenings and vaccines you need and how often you need them. This information is not intended to replace advice given to you by your health care provider. Make sure you discuss any questions you have with your health care provider. Document Released: 08/21/2015 Document Revised: 04/13/2016 Document Reviewed: 05/26/2015 Elsevier Interactive Patient Education  2017 [employer] Prevention in the Home Falls can cause injuries. They can happen to people of all ages. There are many things you can do to make your home safe and to help prevent falls. What can I do on the outside of my home? Regularly fix the edges of walkways and driveways and fix any cracks. Remove anything that might make you trip as you walk through a door, such as a raised step or threshold. Trim any bushes or trees on the path to your home. Use bright outdoor lighting. Clear any walking paths of anything that might make someone trip, such as rocks or tools. Regularly check to see if handrails are loose or broken. Make sure that both sides of any steps have handrails. Any raised decks and porches should have guardrails on the edges. Have any leaves, snow, or ice cleared regularly. Use sand or salt on walking paths during winter. Clean up any spills in your garage right away. This includes oil or grease spills. What can I do in the bathroom? Use night lights. Install grab bars by the toilet and in the tub and shower. Do not use towel bars as grab bars. Use non-skid mats or decals in the tub or shower. If you need to sit down in the shower, use a plastic, non-slip stool. Keep the floor dry. Clean up any water that spills on the floor as soon as it happens. Remove soap buildup in the tub or shower regularly. Attach bath mats securely with double-sided non-slip rug tape. Do not have throw  rugs and other things on the floor that can make you trip. What can I do in the bedroom? Use night lights. Make sure that you have a light by your bed that is easy to reach. Do not use any sheets or blankets that are too big for your bed. They should not hang down onto the floor. Have a firm chair that has side arms. You can use this for support while you get dressed. Do not have throw rugs and other things on the floor that can make you trip. What can I do in the kitchen? Clean up any spills right away. Avoid walking on wet floors. Keep items that you use a lot in easy-to-reach places. If you need to reach something above you, use a strong step stool that has a grab bar. Keep electrical cords out of the way. Do not use floor polish or wax that makes floors slippery. If you must use wax, use non-skid floor wax. Do not have throw rugs and other things on the floor that can make you trip. What can I do with my stairs? Do not leave any items  on the stairs. Make sure that there are handrails on both sides of the stairs and use them. Fix handrails that are broken or loose. Make sure that handrails are as long as the stairways. Check any carpeting to make sure that it is firmly attached to the stairs. Fix any carpet that is loose or worn. Avoid having throw rugs at the top or bottom of the stairs. If you do have throw rugs, attach them to the floor with carpet tape. Make sure that you have a light switch at the top of the stairs and the bottom of the stairs. If you do not have them, ask someone to add them for you. What else can I do to help prevent falls? Wear shoes that: Do not have high heels. Have rubber bottoms. Are comfortable and fit you well. Are closed at the toe. Do not wear sandals. If you use a stepladder: Make sure that it is fully opened. Do not climb a closed stepladder. Make sure that both sides of the stepladder are locked into place. Ask someone to hold it for you, if  possible. Clearly mark and make sure that you can see: Any grab bars or handrails. First and last steps. Where the edge of each step is. Use tools that help you move around (mobility aids) if they are needed. These include: Canes. Walkers. Scooters. Crutches. Turn on the lights when you go into a dark area. Replace any light bulbs as soon as they burn out. Set up your furniture so you have a clear path. Avoid moving your furniture around. If any of your floors are uneven, fix them. If there are any pets around you, be aware of where they are. Review your medicines with your doctor. Some medicines can make you feel dizzy. This can increase your chance of falling. Ask your doctor what other things that you can do to help prevent falls. This information is not intended to replace advice given to you by your health care provider. Make sure you discuss any questions you have with your health care provider. Document Released: 05/21/2009 Document Revised: 12/31/2015 Document Reviewed: 08/29/2014 Elsevier Interactive Patient Education  2017 [employer].

## 2022-06-14 NOTE — Progress Notes (Addendum)
Subjective:   Jackson French is a 71 y.o. male who presents for Medicare Annual/Subsequent preventive examination.  Review of Systems    Defer to PCP Cardiac Risk Factors include: advanced age (>32mn, >>10women);male gender;hypertension     Objective:    Today's Vitals   06/14/22 0856  BP: 113/72  Pulse: 76  Weight: 274 lb 9.6 oz (124.6 kg)  Height: '6\' 1"'$  (1.854 m)   Body mass index is 36.23 kg/m.     06/14/2022    9:02 AM 06/08/2021    9:08 AM  Advanced Directives  Does Patient Have a Medical Advance Directive? Yes Yes  Type of AParamedicof [place]corde [place]Living will [place] will  Does patient want to make changes to medical advance directive? No - Patient declined Yes (MAU/Ambulatory/Procedural Areas - Information given)  Copy of [place]oral [place]acein Chart? No - copy requested No - copy requested    Current Medications (verified) Outpatient Encounter Medications as of 06/14/2022  Medication Sig   chlorthalidone (HYGROTON) 25 MG tablet TAKE 1 TABLET(25 MG) BY MOUTH DAILY   cyanocobalamin (VITAMIN B12) 1000 MCG tablet Take 1 tablet by mouth daily.   levocetirizine (XYZAL) 5 MG tablet Take 1 tablet (5 mg total) by mouth every evening.   levothyroxine (SYNTHROID) 137 MCG tablet Take 1 tablet (137 mcg total) by mouth daily before breakfast.   losartan (COZAAR) 50 MG tablet TAKE 1 TABLET(50 MG) BY MOUTH DAILY   potassium chloride (KLOR-CON M) 10 MEQ tablet Take 1 tablet (10 mEq total) by mouth daily.   sildenafil (VIAGRA) 50 MG tablet Take 1 tablet (50 mg total) by mouth daily as needed for erectile dysfunction.   simvastatin (ZOCOR) 20 MG tablet Take 1 tablet (20 mg total) by mouth daily.   [DISCONTINUED] COVID-19 mRNA Vac-TriS, Pfizer, (COMIRNATY) SUSP injection Inject into the muscle.   [DISCONTINUED] influenza vaccine adjuvanted (FLUAD QUADRIVALENT) 0.5 ML injection Inject into the muscle.   No  facility-administered encounter medications on file as of 06/14/2022.    Allergies (verified) Patient has no known allergies.   History: Past Medical History:  Diagnosis Date   Allergy    Gout    Hyperlipidemia    Hypertension    Sleep apnea    no CPAP   Thyroid disease    Past Surgical History:  Procedure Laterality Date   APPENDECTOMY     CATARACT EXTRACTION, BILATERAL Bilateral    COLONOSCOPY  04/19/2022   HYDROCELE EXCISION / REPAIR     THYROID SURGERY     TONSILLECTOMY     VASECTOMY     Family History  Problem Relation Age of Onset   Arthritis Mother    Breast cancer Mother    Stomach cancer Father 670      duodenal cancer   Heart attack Father    Cancer - Colon Neg Hx    Colon cancer Neg Hx    Rectal cancer Neg Hx    Esophageal cancer Neg Hx    Social History   Socioeconomic History   Marital status: Married    Spouse name: Not on file   Number of children: Not on file   Years of education: Not on file   Highest education level: Not on file  Occupational History   Not on file  Tobacco Use   Smoking status: Never   Smokeless tobacco: Never  Vaping Use   Vaping Use: Never used  Substance and Sexual Activity  Alcohol use: Yes    Comment: rarely   Drug use: Never   Sexual activity: Yes  Other Topics Concern   Not on file  Social History Narrative   Not on file   Social Determinants of Health   Financial Resource Strain: Low Risk  (06/08/2021)   Overall Financial Resource Strain (CARDIA)    Difficulty of Paying Living Expenses: Not hard at all  Food Insecurity: No Food Insecurity (06/14/2022)   Hunger Vital Sign    Worried About Running Out of Food in the Last Year: Never true    Ran Out of Food in the Last Year: Never true  Transportation Needs: No Transportation Needs (06/14/2022)   PRAPARE - Hydrologist (Medical): No    Lack of Transportation (Non-Medical): No  Physical Activity: Sufficiently Active (06/14/2022)    Exercise Vital Sign    Days of Exercise per Week: 4 days    Minutes of Exercise per Session: 60 min  Stress: No Stress Concern Present (06/08/2021)   [place]    Feeling of Stress : Not at all  Social Connections: Moderately Isolated (06/08/2021)   Social Connection and Isolation Panel [NHANES]    Frequency of Communication with Friends and Family: More than three times a week    Frequency of Social Gatherings with Friends and Family: More than three times a week    Attends Religious Services: Never    [name] or Organizations: No    Attends Music therapist: Never    Marital Status: Married    Tobacco Counseling Counseling given: Not Answered   Clinical Intake:  Pre-visit preparation completed: Yes  Pain : No/denies pain  Diabetes: No  How often do you need to have someone help you when you read instructions, pamphlets, or other written materials from your doctor or pharmacy?: 1 - Never   Activities of Daily Living    06/14/2022    9:03 AM  In your present state of health, do you have any difficulty performing the following activities:  Hearing? 0  Vision? 0  Difficulty concentrating or making decisions? 0  Walking or climbing stairs? 0  Dressing or bathing? 0  Doing errands, shopping? 0  Preparing Food and eating ? N  Using the Toilet? N  In the past six months, have you accidently leaked urine? N  Do you have problems with loss of bowel control? N  Managing your Medications? N  Managing your Finances? N  Housekeeping or managing your Housekeeping? N    Patient Care Team: Saguier, Jackson French as PCP - General (Internal Medicine) Jackson Mallow, MD as Consulting Physician (Urology)  Indicate any recent Medical Services you may have received from other than [facility] providers in the past year (date may be approximate).     Assessment:   This is a routine wellness  examination for Jackson French.  Hearing/Vision screen No results found.  Dietary issues and exercise activities discussed: Current Exercise Habits: [facility] Type of exercise: walking;strength training/weights, Time (Minutes): 60, Frequency (Times/Week): 4, Weekly Exercise (Minutes/Week): 240, Exercise limited by: None identified   Goals Addressed   None    Depression Screen    06/14/2022    9:03 AM 06/08/2021    9:10 AM 04/01/2021    8:59 AM 10/13/2020    8:03 AM  PHQ 2/9 Scores  PHQ - 2 Score 0 0 0  0    Fall Risk    06/14/2022    9:03 AM 06/08/2021    9:09 AM 04/01/2021    8:59 AM  Fall Risk   Falls in the past year? 0 0 1  Number falls in past yr: 0 0 1  Injury with Fall? 0 0 0  Risk for fall due to : No Fall Risks    Follow up Falls evaluation completed Falls prevention discussed     FALL RISK PREVENTION PERTAINING TO THE HOME:  Any stairs in or around the home? Yes  If so, are there any without handrails? No  Home free of loose throw rugs in walkways, pet beds, electrical cords, etc? Yes  Adequate lighting in your home to reduce risk of falls? Yes   ASSISTIVE DEVICES UTILIZED TO PREVENT FALLS:  Life alert? No  Use of a cane, walker or w/c? No  Grab bars in the bathroom? No  Shower chair or bench in shower? Yes  Elevated toilet seat or a handicapped toilet? Yes   TIMED UP AND GO:  Was the test performed? Yes .  Length of time to ambulate 10 feet: 6 sec.   Gait steady and fast without use of assistive device  Cognitive Function:        06/14/2022    9:09 AM  6CIT Screen  What Year? 0 points  What month? 0 points  What time? 0 points  Count back from 20 0 points  Months in reverse 0 points  Repeat phrase 0 points  Total Score 0 points    Immunizations Immunization History  Administered Date(s) Administered   Fluad Quad(high Dose 65+) 05/17/2016, 05/31/2017, 05/25/2019, 05/20/2021, 05/20/2022   Influenza Split 05/14/2013    Influenza, High Dose Seasonal PF 05/16/2018   Influenza,trivalent, recombinat, inj, PF 05/14/2013   Influenza-Unspecified 05/17/2016, 05/31/2017   Moderna Covid-19 Vaccine Bivalent Booster 52yr & up 05/20/2021   Moderna SARS-COV2 Booster Vaccination 06/16/2020   Moderna Sars-Covid-2 Vaccination 09/12/2019, 10/15/2019   PFIZER Comirnaty(Gray Top)Covid-19 Tri-Sucrose Vaccine 05/20/2022    TDAP status: Up to date  Flu Vaccine status: Up to date  Pneumococcal vaccine status: Due, Education has been provided regarding the importance of this vaccine. Advised may receive this vaccine at local pharmacy or Health Dept. Aware to provide a copy of the vaccination record if obtained from local pharmacy or Health Dept. Verbalized acceptance and understanding.  Covid-19 vaccine status: Information provided on how to obtain vaccines.   Qualifies for Shingles Vaccine? Yes   Zostavax completed No   Shingrix Completed?: No.    Education has been provided regarding the importance of this vaccine. Patient has been advised to call insurance company to determine out of pocket expense if they have not yet received this vaccine. Advised may also receive vaccine at local pharmacy or Health Dept. Verbalized acceptance and understanding.  Screening Tests Health Maintenance  Topic Date Due   Hepatitis C Screening  Never done   Zoster Vaccines- Shingrix (1 of 2) Never done   Pneumonia Vaccine 71 Years old (1 - PCV) Never done   Medicare Annual Wellness (AWV)  06/08/2022   COVID-19 Vaccine (4 - Moderna series) 09/20/2022   COLONOSCOPY (Pts [id number] coverage will need to be confirmed)  04/21/2025   TETANUS/TDAP  01/19/2027   INFLUENZA VACCINE  Completed   HPV VACCINES  Aged Out    Health Maintenance  Health Maintenance Due  Topic Date Due   Hepatitis C Screening  Never done  Zoster Vaccines- Shingrix (1 of 2) Never done   Pneumonia Vaccine 73+ Years old (1 - PCV) Never done   Medicare Annual  Wellness (AWV)  06/08/2022    Colorectal cancer screening: Type of screening: Colonoscopy. Completed 04/21/22. Repeat every 3 years  Lung Cancer Screening: (Low Dose CT Chest recommended if Age 60-80 years, 30 pack-year currently smoking OR have quit w/in 15years.) does not qualify.   Lung Cancer Screening Referral: N/a  Additional Screening:  Hepatitis C Screening: does qualify; Completed n/a  Vision Screening: Recommended annual ophthalmology exams for early detection of glaucoma and other disorders of the eye. Is the patient up to date with their annual eye exam?  Yes  Who is the provider or what is the name of the office in which the patient attends annual eye exams? Doesn't remember name If pt is not established with a provider, would they like to be referred to a provider to establish care? No .   Dental Screening: Recommended annual dental exams for proper oral hygiene  Community Resource Referral / Chronic Care Management: CRR required this visit?  No   CCM required this visit?  No      Plan:     I have personally reviewed and noted the following in the patient's chart:   Medical and social history Use of alcohol, tobacco or illicit drugs  Current medications and supplements including opioid prescriptions. Patient is not currently taking opioid prescriptions. Functional ability and status Nutritional status Physical activity Advanced directives List of other physicians Hospitalizations, surgeries, and ER visits in previous 12 months Vitals Screenings to include cognitive, depression, and falls Referrals and appointments  In addition, I have reviewed and discussed with patient certain preventive protocols, quality metrics, and best practice recommendations. A written personalized care plan for preventive services as well as general preventive health recommendations were provided to patient.     Beatris Ship, [place]   06/14/2022   Nurse Notes: None Review and  Agree with assessment & plan of cma.  Mackie Pai, PA-C

## 2022-06-14 NOTE — Telephone Encounter (Signed)
Rx refill zocor sent.

## 2022-06-28 ENCOUNTER — Encounter: Payer: Self-pay | Admitting: Medical

## 2022-07-19 ENCOUNTER — Other Ambulatory Visit: Payer: Self-pay | Admitting: Medical

## 2022-08-13 ENCOUNTER — Other Ambulatory Visit: Payer: Self-pay | Admitting: Medical

## 2022-08-28 ENCOUNTER — Encounter: Payer: Self-pay | Admitting: Medical

## 2022-08-29 MED ORDER — LOSARTAN POTASSIUM 50 MG PO TABS: ORAL_TABLET | ORAL | 2 refills | Status: DC

## 2022-09-07 ENCOUNTER — Encounter: Payer: Self-pay | Admitting: Medical

## 2022-09-13 ENCOUNTER — Ambulatory Visit (INDEPENDENT_AMBULATORY_CARE_PROVIDER_SITE_OTHER): Payer: Medicare Other | Admitting: Medical

## 2022-09-13 ENCOUNTER — Ambulatory Visit (HOSPITAL_BASED_OUTPATIENT_CLINIC_OR_DEPARTMENT_OTHER)
Admission: RE | Admit: 2022-09-13 | Discharge: 2022-09-13 | Disposition: A | Discharge status: Home / Self Care | Payer: Medicare Other | Source: Ambulatory Visit | Attending: Medical | Admitting: Medical

## 2022-09-13 VITALS — BP 128/82 | HR 58 | Resp 18 | Ht 73.0 in | Wt 279.6 lb

## 2022-09-13 DIAGNOSIS — R5383 Other fatigue: Secondary | ICD-10-CM

## 2022-09-13 DIAGNOSIS — E785 Hyperlipidemia, unspecified: Secondary | ICD-10-CM | POA: Diagnosis not present

## 2022-09-13 DIAGNOSIS — M79672 Pain in left foot: Secondary | ICD-10-CM

## 2022-09-13 LAB — COMPREHENSIVE METABOLIC PANEL
ALT: 21 U/L (ref 0–53)
AST: 19 U/L (ref 0–37)
Albumin: 4.7 g/dL (ref 3.5–5.2)
Alkaline Phosphatase: 69 U/L (ref 39–117)
BUN: 17 mg/dL (ref 6–23)
CO2: 25 mEq/L (ref 19–32)
Calcium: 9.2 mg/dL (ref 8.4–10.5)
Chloride: 104 mEq/L (ref 96–112)
Creatinine, Ser: 0.98 mg/dL (ref 0.40–1.50)
GFR: 77.38 mL/min (ref >60.00)
Glucose, Bld: 95 mg/dL (ref 70–99)
Potassium: 4.3 mEq/L (ref 3.5–5.1)
Sodium: 141 mEq/L (ref 135–145)
Total Bilirubin: 0.7 mg/dL (ref 0.2–1.2)
Total Protein: 6.8 g/dL (ref 6.0–8.3)

## 2022-09-13 LAB — LIPID PANEL
Cholesterol: 114 mg/dL (ref 0–200)
HDL: 33.5 mg/dL — ABNORMAL LOW (ref >39.00)
LDL Cholesterol: 54 mg/dL (ref 0–99)
NonHDL: 80.81
Total CHOL/HDL Ratio: 3
Triglycerides: 135 mg/dL (ref 0.0–149.0)
VLDL: 27 mg/dL (ref 0.0–40.0)

## 2022-09-13 LAB — VITAMIN B12: Vitamin B-12: 977 pg/mL — ABNORMAL HIGH (ref 211–911)

## 2022-09-13 MED ORDER — SIMVASTATIN 20 MG PO TABS: 20.0000 mg | ORAL_TABLET | Freq: Every day | ORAL | 3 refills | Status: DC

## 2022-09-13 NOTE — Patient Instructions (Addendum)
Foot pain in region of vein. Had for months and pain after exercising. I am considering bone, tendon pain or vein pain. However I don't see thrombophlebitis typically lasting for 2 months. Recommend ace wrap and low dose ibuprofen.  Update me in one week.  For high cholesterol cmp and lipid panel. Refill simvastatin.  For mild fatigue and lower end b12 get b12 level.   Follow up date to be determined after lab and imaging review.  Rx keflex sent. See pt my chart message 09-16-22. Also grandson has strep.

## 2022-09-13 NOTE — Progress Notes (Signed)
Subjective:    Patient ID: Jackson French, male    DOB: 1951-05-20, 72 y.o.   MRN: [id number]  HPI  Pt in for pain in left foot. He states has superficial vein that throbs and pulsates. He states noticed area in December. He states after working out and propping feet up pain will get worse. He states if applies pressure to area with other foot pain decreases.   He thinks maybe some calf   Pt states walks 1 mile 3 days a week. Also doing weights 3 days a week.  Some mild fatigue and lower end b12.  Pt had his thyroid hormone adjusted by endocrinologist.    Review of Systems  Constitutional:  Negative for chills, fatigue and fever.  HENT:  Negative for congestion and ear discharge.   Respiratory:  Negative for chest tightness, shortness of breath and wheezing.   Cardiovascular:  Negative for chest pain and palpitations.  Gastrointestinal:  Negative for abdominal pain.  Musculoskeletal:        Foot pain. See hpi.  Neurological:  Negative for tremors, seizures and headaches.  Hematological:  Negative for adenopathy. Does not bruise/bleed easily.  Psychiatric/Behavioral:  Negative for behavioral problems and dysphoric mood.     Past Medical History:  Diagnosis Date   Allergy    Gout    Hyperlipidemia    Hypertension    Sleep apnea    no CPAP   Thyroid disease      Social History   Socioeconomic History   Marital status: Married    Spouse name: Not on file   Number of children: Not on file   Years of education: Not on file   Highest education level: Not on file  Occupational History   Not on file  Tobacco Use   Smoking status: Never   Smokeless tobacco: Never  Vaping Use   Vaping Use: Never used  Substance and Sexual Activity   Alcohol use: Yes    Comment: rarely   Drug use: Never   Sexual activity: Yes  Other Topics Concern   Not on file  Social History Narrative   Not on file   Social Determinants of Health   Financial Resource Strain: Low Risk   (06/08/2021)   Overall Financial Resource Strain (CARDIA)    Difficulty of Paying Living Expenses: Not hard at all  Food Insecurity: No Food Insecurity (06/14/2022)   Hunger Vital Sign    Worried About Running Out of Food in the Last Year: Never true    [place] in the Last Year: Never true  Transportation Needs: No Transportation Needs (06/14/2022)   PRAPARE - Hydrologist (Medical): No    Lack of Transportation (Non-Medical): No  Physical Activity: Sufficiently Active (06/14/2022)   Exercise Vital Sign    Days of Exercise per Week: 4 days    Minutes of Exercise per Session: 60 min  Stress: No Stress Concern Present (06/08/2021)   [place]    Feeling of Stress : Not at all  Social Connections: Moderately Isolated (06/08/2021)   Social Connection and Isolation Panel [NHANES]    Frequency of Communication with Friends and Family: More than three times a week    Frequency of Social Gatherings with Friends and Family: More than three times a week    Attends Religious Services: Never    [name] or Organizations: No    Attends [employer]  or Organization Meetings: Never    Marital Status: Married  Human resources officer Violence: Not At Risk (06/14/2022)   Humiliation, Afraid, Rape, and Kick questionnaire    Fear of Current or Ex-Partner: No    Emotionally Abused: No    Physically Abused: No    Sexually Abused: No    Past Surgical History:  Procedure Laterality Date   APPENDECTOMY     CATARACT EXTRACTION, BILATERAL Bilateral    COLONOSCOPY  04/19/2022   HYDROCELE EXCISION / REPAIR     THYROID SURGERY     TONSILLECTOMY     VASECTOMY      Family History  Problem Relation Age of Onset   Arthritis Mother    Breast cancer Mother    Stomach cancer Father 25       duodenal cancer   Heart attack Father    Cancer - Colon Neg Hx    Colon cancer Neg Hx    Rectal cancer Neg Hx     Esophageal cancer Neg Hx     No Known Allergies  Current Outpatient Medications on File Prior to Visit  Medication Sig Dispense Refill   cyanocobalamin (VITAMIN B12) 1000 MCG tablet Take 1 tablet by mouth daily.     levocetirizine (XYZAL) 5 MG tablet TAKE 1 TABLET BY MOUTH EVERY DAY IN THE EVENING 90 tablet 0   levothyroxine (SYNTHROID) 137 MCG tablet Take 1 tablet (137 mcg total) by mouth daily before breakfast. 90 tablet 0   losartan (COZAAR) 50 MG tablet TAKE 1 TABLET(50 MG) BY MOUTH DAILY 90 tablet 2   potassium chloride (KLOR-CON M) 10 MEQ tablet Take 1 tablet (10 mEq total) by mouth daily. 7 tablet 0   sildenafil (VIAGRA) 50 MG tablet Take 1 tablet (50 mg total) by mouth daily as needed for erectile dysfunction. 10 tablet 0   No current facility-administered medications on file prior to visit.    BP 128/82   Pulse (!) 58   Resp 18   Ht '6\' 1"'$  (1.854 m)   Wt 279 lb 9.6 oz (126.8 kg)   SpO2 99%   BMI 36.89 kg/m        Objective:   Physical Exam   General- No acute distress. Pleasant patient. Neck- Full range of motion, no jvd Lungs- Clear, even and unlabored. Heart- regular rate and rhythm. Neurologic- CNII- XII grossly intact.   Left foot- normal pulses. Small vein vsible but no obvious pain. No warmth.  Top of foot small tender area   Left calf- not swollen. Negative homans sign.       Assessment & Plan:   Patient Instructions  Foot pain in region of vein. Had for months and pain after exercising. I am considering bone, tendon pain or vein pain. However I don't see thrombophlebitis typically lasting for 2 months. Recommend ace wrap and low dose ibuprofen.  Update me in one week.  For high cholesterol cmp and lipid panel. Refill simvastatin.  For mild fatigue and lower end b12 get b12 level.   Follow up date to be determined after lab and imaging review.    Mackie Pai, PA-C

## 2022-09-16 ENCOUNTER — Encounter: Payer: Self-pay | Admitting: Medical

## 2022-09-16 MED ORDER — CEPHALEXIN 500 MG PO CAPS: 500.0000 mg | ORAL_CAPSULE | Freq: Three times a day (TID) | ORAL | 0 refills | Status: DC

## 2022-09-16 NOTE — Addendum Note (Signed)
Addended by: Anabel Halon on: 09/16/2022 12:36 PM   Modules accepted: Orders

## 2022-11-08 DIAGNOSIS — E039 Hypothyroidism, unspecified: Secondary | ICD-10-CM | POA: Diagnosis not present

## 2022-11-13 ENCOUNTER — Other Ambulatory Visit: Payer: Self-pay | Admitting: Medical

## 2022-11-14 MED ORDER — LEVOCETIRIZINE DIHYDROCHLORIDE 5 MG PO TABS: 5.0000 mg | ORAL_TABLET | Freq: Every evening | ORAL | 0 refills | Status: DC

## 2022-12-08 ENCOUNTER — Encounter: Payer: Self-pay | Admitting: Medical

## 2022-12-08 MED ORDER — SIMVASTATIN 20 MG PO TABS: 20.0000 mg | ORAL_TABLET | Freq: Every day | ORAL | 3 refills | Status: DC

## 2022-12-27 ENCOUNTER — Encounter: Payer: Self-pay | Admitting: Medical

## 2022-12-28 ENCOUNTER — Encounter: Payer: Self-pay | Admitting: Medical

## 2022-12-28 ENCOUNTER — Ambulatory Visit (INDEPENDENT_AMBULATORY_CARE_PROVIDER_SITE_OTHER): Payer: Medicare Other | Admitting: Medical

## 2022-12-28 VITALS — BP 137/79 | HR 73 | Temp 98.0°F | Resp 18 | Ht 73.0 in | Wt 283.0 lb

## 2022-12-28 DIAGNOSIS — I10 Essential (primary) hypertension: Secondary | ICD-10-CM | POA: Diagnosis not present

## 2022-12-28 DIAGNOSIS — R001 Bradycardia, unspecified: Secondary | ICD-10-CM

## 2022-12-28 DIAGNOSIS — R9431 Abnormal electrocardiogram [ECG] [EKG]: Secondary | ICD-10-CM

## 2022-12-28 DIAGNOSIS — R350 Frequency of micturition: Secondary | ICD-10-CM

## 2022-12-28 DIAGNOSIS — E039 Hypothyroidism, unspecified: Secondary | ICD-10-CM

## 2022-12-28 DIAGNOSIS — R6883 Chills (without fever): Secondary | ICD-10-CM

## 2022-12-28 DIAGNOSIS — M545 Low back pain, unspecified: Secondary | ICD-10-CM

## 2022-12-28 LAB — POC URINALSYSI DIPSTICK (AUTOMATED)
Bilirubin, UA: NEGATIVE
Blood, UA: NEGATIVE
Glucose, UA: NEGATIVE
Ketones, UA: NEGATIVE
Leukocytes, UA: NEGATIVE
Nitrite, UA: NEGATIVE
Protein, UA: NEGATIVE
Spec Grav, UA: 1.01 (ref 1.010–1.025)
Urobilinogen, UA: 0.2 E.U./dL
pH, UA: 5 (ref 5.0–8.0)

## 2022-12-28 MED ORDER — CIPROFLOXACIN HCL 500 MG PO TABS: 500.0000 mg | ORAL_TABLET | Freq: Two times a day (BID) | ORAL | 0 refills | Status: DC

## 2022-12-28 NOTE — Patient Instructions (Addendum)
1. Frequent urination(consdering bph, prostatitis vs uti) Rx cipro 500 mg bid x 10 days - CBC w/Diff - PSA - POCT Urinalysis Dipstick (Automated) - Urine Culture  2. Bilateral low back pain without sciatica, unspecified chronicity Lower back pain. Maybe prostate related but known degernative changes of lumbar spine. Will follow prostate work up. If pain in back worsens reconsider dx. Can use low dose ibuprofen presently in combination with tylenol  3. Chill Follow lab see if wbc elevation. - CBC w/Diff - Urine Culture  4. Hypertension, unspecified type Continue losartan - EKG 12-Lead  5. Bradycardia intermittent at times. (On EKG no bradycardia. Appears flat t wave v1, v2 and v3. Q wave lead 3. Does not mach clinical history. Will refer to cadiologist to get opinion) - EKG 12-Lead   Follow up date to be determined after lab and urine study results.

## 2022-12-28 NOTE — Progress Notes (Signed)
..  Subjective:    Patient ID: Jackson French, male    DOB: 1951/06/03, 72 y.o.   MRN: [id number]  HPI Pt in for some lower back pain, buttock and hamstring area for about 5-7 days. He states aching and throbbing pain. Some frequent urination at night for past 4-5 days. Some chills intermittently.   Pt had history of periodic intermittent psa elevation. In the past. In the past was given antibioitc for prostatitis.   Pt has seen urologist did DRE but never did finger.   Pt has tried ibuprofoen low dose 200 mg  and helped with symtoms  Random bradycardia sometimes less than 50 on smart watch when he is resting. He sees his pulse increase with activity. No symptoms.   Review of Systems  Constitutional:  Negative for chills, fatigue and fever.  HENT:  Negative for dental problem, drooling and ear discharge.   Respiratory:  Negative for cough, chest tightness and wheezing.   Cardiovascular:  Negative for chest pain and palpitations.  Gastrointestinal:  Negative for abdominal pain, diarrhea and vomiting.  Genitourinary:  Positive for frequency. Negative for dysuria.       Suprapubic pressure.  Musculoskeletal:  Negative for back pain.  Skin:  Negative for rash.  Neurological:  Negative for dizziness, seizures, weakness and headaches.  Hematological:  Does not bruise/bleed easily.  Psychiatric/Behavioral:  Negative for behavioral problems and confusion.     Past Medical History:  Diagnosis Date   Allergy    Gout    Hyperlipidemia    Hypertension    Sleep apnea    no CPAP   Thyroid disease      Social History   Socioeconomic History   Marital status: Married    Spouse name: Not on file   Number of children: Not on file   Years of education: Not on file   Highest education level: Professional school degree (e.g., MD, DDS, DVM, JD)  Occupational History   Not on file  Tobacco Use   Smoking status: Never   Smokeless tobacco: Never  Vaping Use   Vaping Use: Never used   Substance and Sexual Activity   Alcohol use: Yes    Comment: rarely   Drug use: Never   Sexual activity: Yes  Other Topics Concern   Not on file  Social History Narrative   Not on file   Social Determinants of Health   Financial Resource Strain: Low Risk  (12/27/2022)   Overall Financial Resource Strain (CARDIA)    Difficulty of Paying Living Expenses: Not hard at all  Food Insecurity: No Food Insecurity (12/27/2022)   Hunger Vital Sign    Worried About Running Out of Food in the Last Year: Never true    Ran Out of Food in the Last Year: Never true  Transportation Needs: No Transportation Needs (12/27/2022)   PRAPARE - Administrator, Civil Service (Medical): No    Lack of Transportation (Non-Medical): No  Physical Activity: Insufficiently Active (12/27/2022)   Exercise Vital Sign    Days of Exercise per Week: 3 days    Minutes of Exercise per Session: 40 min  Stress: No Stress Concern Present (12/27/2022)   [employer] of Occupational Health - Occupational Stress Questionnaire    Feeling of Stress : Only a little  Social Connections: Socially Integrated (12/27/2022)   Social Connection and Isolation Panel [NHANES]    Frequency of Communication with Friends and Family: Three times a week    Frequency of  Social Gatherings with Friends and Family: More than three times a week    Attends Religious Services: 1 to 4 times per year    Active Member of [employer] or Organizations: Yes    Attends Engineer, structural: More than 4 times per year    Marital Status: Married  Catering manager Violence: Not At Risk (06/14/2022)   Humiliation, Afraid, Rape, and Kick questionnaire    Fear of Current or Ex-Partner: No    Emotionally Abused: No    Physically Abused: No    Sexually Abused: No    Past Surgical History:  Procedure Laterality Date   APPENDECTOMY     CATARACT EXTRACTION, BILATERAL Bilateral    COLONOSCOPY  04/19/2022   HYDROCELE EXCISION / REPAIR      THYROID SURGERY     TONSILLECTOMY     VASECTOMY      Family History  Problem Relation Age of Onset   Arthritis Mother    Breast cancer Mother    Stomach cancer Father 30       duodenal cancer   Heart attack Father    Cancer - Colon Neg Hx    Colon cancer Neg Hx    Rectal cancer Neg Hx    Esophageal cancer Neg Hx     No Known Allergies  Current Outpatient Medications on File Prior to Visit  Medication Sig Dispense Refill   cyanocobalamin (VITAMIN B12) 1000 MCG tablet Take 1 tablet by mouth daily.     levocetirizine (XYZAL) 5 MG tablet Take 1 tablet (5 mg total) by mouth every evening. 90 tablet 0   levothyroxine (SYNTHROID) 137 MCG tablet Take 1 tablet (137 mcg total) by mouth daily before breakfast. 90 tablet 0   losartan (COZAAR) 50 MG tablet TAKE 1 TABLET(50 MG) BY MOUTH DAILY 90 tablet 2   potassium chloride (KLOR-CON M) 10 MEQ tablet Take 1 tablet (10 mEq total) by mouth daily. 7 tablet 0   sildenafil (VIAGRA) 50 MG tablet Take 1 tablet (50 mg total) by mouth daily as needed for erectile dysfunction. 10 tablet 0   simvastatin (ZOCOR) 20 MG tablet Take 1 tablet (20 mg total) by mouth daily. 90 tablet 3   No current facility-administered medications on file prior to visit.    BP (!) 150/80   Pulse 73   Temp 98 F (36.7 C)   Resp 18   Ht 6\' 1"  (1.854 m)   Wt 283 lb (128.4 kg)   SpO2 96%   BMI 37.34 kg/m        Objective:   Physical Exam  General Appearance- Not in acute distress.    Chest and Lung Exam Auscultation: Breath sounds:-Normal. Clear even and unlabored. Adventitious sounds:- No Adventitious sounds.  Cardiovascular Auscultation:Rythm - Regular, rate and rythm. Heart Sounds -Normal heart sounds.  Abdomen Inspection:-Inspection Normal.  Palpation/Perucssion: Palpation and Percussion of the abdomen reveal- mild suprapbuic  Tender, No Rebound tenderness, No rigidity(Guarding) and No Palpable abdominal masses.  Liver:-Normal.  Spleen:- Normal.    Back No Mid lumbar spine tenderness to palpation. No Pain on straight leg lift. Pain on lateral movements and flexion/extension of the spine. Bilateral si area tenderness and upper buttock pain.  Lower ext neurologic  L5-S1 sensation intact bilaterally. Normal patellar reflexes bilaterally. No foot drop bilaterally.       Assessment & Plan:   Patient Instructions  1. Frequent urination(consdering bph, prostatitis vs uti) Rx cipro 500 mg bid x 10 days - CBC w/Diff -  PSA - POCT Urinalysis Dipstick (Automated) - Urine Culture  2. Bilateral low back pain without sciatica, unspecified chronicity Lower back pain. Maybe prostate related but known degernative changes of lumbar spine. Will follow prostate work up. If pain in back worsens reconsider dx. Can use low dose ibuprofen presently in combination with tylenol  3. Chill Follow lab see if wbc elevation. - CBC w/Diff - Urine Culture  4. Hypertension, unspecified type Continue losartan - EKG 12-Lead  5. Bradycardia intermittent at times.  - EKG 12-Lead   Follow up date to be determined after lab and urine study results.   Esperanza Richters, PA-C

## 2022-12-29 LAB — CBC WITH DIFFERENTIAL/PLATELET
Basophils Absolute: 0.1 10*3/uL (ref 0.0–0.1)
Basophils Relative: 0.8 % (ref 0.0–3.0)
Eosinophils Absolute: 0.4 10*3/uL (ref 0.0–0.7)
Eosinophils Relative: 6.2 % — ABNORMAL HIGH (ref 0.0–5.0)
HCT: 45.1 % (ref 39.0–52.0)
Hemoglobin: 15.2 g/dL (ref 13.0–17.0)
Lymphocytes Relative: 20.8 % (ref 12.0–46.0)
Lymphs Abs: 1.5 10*3/uL (ref 0.7–4.0)
MCHC: 33.8 g/dL (ref 30.0–36.0)
MCV: 92.4 fl (ref 78.0–100.0)
Monocytes Absolute: 0.6 10*3/uL (ref 0.1–1.0)
Monocytes Relative: 8.2 % (ref 3.0–12.0)
Neutro Abs: 4.6 10*3/uL (ref 1.4–7.7)
Neutrophils Relative %: 64 % (ref 43.0–77.0)
Platelets: 226 10*3/uL (ref 150.0–400.0)
RBC: 4.88 Mil/uL (ref 4.22–5.81)
RDW: 12.7 % (ref 11.5–15.5)
WBC: 7.1 10*3/uL (ref 4.0–10.5)

## 2022-12-29 LAB — URINE CULTURE
MICRO NUMBER:: 14990161
Result:: NO GROWTH
SPECIMEN QUALITY:: ADEQUATE

## 2022-12-29 LAB — PSA: PSA: 2.31 ng/mL (ref 0.10–4.00)

## 2022-12-30 ENCOUNTER — Encounter: Payer: Self-pay | Admitting: Medical

## 2022-12-30 MED ORDER — METHYLPREDNISOLONE 4 MG PO TABS: ORAL_TABLET | ORAL | 0 refills | Status: DC

## 2022-12-30 NOTE — Addendum Note (Signed)
Addended by: Gwenevere Abbot on: 12/30/2022 09:26 AM   Modules accepted: Orders

## 2023-02-10 ENCOUNTER — Other Ambulatory Visit: Payer: Self-pay | Admitting: Medical

## 2023-02-20 ENCOUNTER — Encounter: Payer: Self-pay | Admitting: Medical

## 2023-02-20 MED ORDER — LOSARTAN POTASSIUM 50 MG PO TABS: ORAL_TABLET | ORAL | 2 refills | Status: DC

## 2023-03-21 NOTE — Progress Notes (Signed)
Referring-Edward Saguier PA-C Reason for referral-chest pain, hypertension and bradycardia  HPI: 72 year old male for evaluation of chest pain, hypertension and bradycardia at request of [employer].  Labs February 2024 showed sodium 141, potassium 4.3, creatinine 0.98.  Labs in April 2024 showed TSH 3.573.  Previously felt to have intermittent bradycardia and cardiology asked to evaluate.  Patient has occasional pain in the substernal area without radiation.  Lasts minutes and resolve spontaneously.  Not exertional.  No associated symptoms.  Feels it may be "indigestion".  He has dyspnea with more vigorous activities but not routine activities.  No orthopnea or PND but he occasionally has mild pedal edema.  He noted that his heart rate was in the 40s at rest at times.  No history of syncope.  Cardiology now asked to evaluate.  Current Outpatient Medications  Medication Sig Dispense Refill   ciprofloxacin (CIPRO) 500 MG tablet Take 1 tablet (500 mg total) by mouth 2 (two) times daily. 20 tablet 0   cyanocobalamin (VITAMIN B12) 1000 MCG tablet Take 1 tablet by mouth daily.     levocetirizine (XYZAL) 5 MG tablet TAKE 1 TABLET BY MOUTH EVERY DAY IN THE EVENING 90 tablet 0   levothyroxine (SYNTHROID) 137 MCG tablet Take 1 tablet (137 mcg total) by mouth daily before breakfast. 90 tablet 0   losartan (COZAAR) 50 MG tablet TAKE 1 TABLET(50 MG) BY MOUTH DAILY 90 tablet 2   methylPREDNISolone (MEDROL) 4 MG tablet Standard 6 day dose pack 21 tablet 0   potassium chloride (KLOR-CON M) 10 MEQ tablet Take 1 tablet (10 mEq total) by mouth daily. 7 tablet 0   sildenafil (VIAGRA) 50 MG tablet Take 1 tablet (50 mg total) by mouth daily as needed for erectile dysfunction. 10 tablet 0   simvastatin (ZOCOR) 20 MG tablet Take 1 tablet (20 mg total) by mouth daily. 90 tablet 3   No current facility-administered medications for this visit.    No Known Allergies   Past Medical History:  Diagnosis  Date   Allergy    Gout    Hyperlipidemia    Hypertension    Sleep apnea    no CPAP   Thyroid disease     Past Surgical History:  Procedure Laterality Date   APPENDECTOMY     CATARACT EXTRACTION, BILATERAL Bilateral    COLONOSCOPY  04/19/2022   HYDROCELE EXCISION / REPAIR     THYROID SURGERY     TONSILLECTOMY     VASECTOMY      Social History   Socioeconomic History   Marital status: Married    Spouse name: Not on file   Number of children: 3   Years of education: Not on file   Highest education level: Professional school degree (e.g., MD, DDS, DVM, JD)  Occupational History   Not on file  Tobacco Use   Smoking status: Never   Smokeless tobacco: Never  Vaping Use   Vaping status: Never Used  Substance and Sexual Activity   Alcohol use: Yes    Comment: rarely   Drug use: Never   Sexual activity: Yes  Other Topics Concern   Not on file  Social History Narrative   Not on file   Social Determinants of Health   Financial Resource Strain: Low Risk  (12/27/2022)   Overall Financial Resource Strain (CARDIA)    Difficulty of Paying Living Expenses: Not hard at all  Food Insecurity: No Food Insecurity (12/27/2022)   Hunger Vital Sign  Worried About Programme researcher, broadcasting/film/video in the Last Year: Never true    Ran Out of Food in the Last Year: Never true  Transportation Needs: No Transportation Needs (12/27/2022)   PRAPARE - Administrator, Civil Service (Medical): No    Lack of Transportation (Non-Medical): No  Physical Activity: Insufficiently Active (12/27/2022)   Exercise Vital Sign    Days of Exercise per Week: 3 days    Minutes of Exercise per Session: 40 min  Stress: No Stress Concern Present (12/27/2022)   [employer] of Occupational Health - Occupational Stress Questionnaire    Feeling of Stress : Only a little  Social Connections: Socially Integrated (12/27/2022)   Social Connection and Isolation Panel [NHANES]    Frequency of Communication with  Friends and Family: Three times a week    Frequency of Social Gatherings with Friends and Family: More than three times a week    Attends Religious Services: 1 to 4 times per year    Active Member of [employer] or Organizations: Yes    Attends Engineer, structural: More than 4 times per year    Marital Status: Married  Catering manager Violence: Not At Risk (06/14/2022)   Humiliation, Afraid, Rape, and Kick questionnaire    Fear of Current or Ex-Partner: No    Emotionally Abused: No    Physically Abused: No    Sexually Abused: No    Family History  Problem Relation Age of Onset   Arthritis Mother    Breast cancer Mother    Stomach cancer Father 73       duodenal cancer   Heart attack Father    Cancer - Colon Neg Hx    Colon cancer Neg Hx    Rectal cancer Neg Hx    Esophageal cancer Neg Hx     ROS: no fevers or chills, productive cough, hemoptysis, dysphasia, odynophagia, melena, hematochezia, dysuria, hematuria, rash, seizure activity, orthopnea, PND, pedal edema, claudication. Remaining systems are negative.  Physical Exam:   Blood pressure 129/89, pulse 64, height 6\' 1"  (1.854 m), weight 290 lb 6.4 oz (131.7 kg), SpO2 96%.  General:  Well developed/well nourished in NAD Skin warm/dry Patient not depressed No peripheral clubbing Back-normal HEENT-normal/normal eyelids Neck supple/normal carotid upstroke bilaterally; no bruits; no JVD; no thyromegaly chest - CTA/ normal expansion CV - RRR/normal S1 and S2; no murmurs, rubs or gallops;  PMI nondisplaced Abdomen -NT/ND, no HSM, no mass, + bowel sounds, no bruit 2+ femoral pulses, no bruits Ext-no edema, chords, 2+ DP Neuro-grossly nonfocal  ECG -Dec 28, 2022-normal sinus rhythm with no significant ST changes.  Personally reviewed  EKG Interpretation Date/Time:  Wednesday March 29 2023 08:36:56 EDT Ventricular Rate:  64 PR Interval:  170 QRS Duration:  84 QT Interval:  376 QTC Calculation: 387 R  Axis:   -32  Text Interpretation: Normal sinus rhythm Left axis deviation Non-specific ST-t changes No previous ECGs available Confirmed by Olga Millers ([id number]) on 03/29/2023 8:40:58 AM    A/P  1 bradycardia-patient states his heart rate is in the 40s at times at rest.  I do not have rhythm strips or ECGs associated with these episodes.  His heart rate today is normal.  He has not had syncope.  No indication for further workup.  2 hypertension-blood pressure controlled.  Continue present medications.  3 hyperlipidemia-continue Zocor.  If he has coronary artery disease demonstrated on his CTA we will likely change to Lipitor or Crestor at higher  doses.  4 chest pain-symptoms are atypical.  Will arrange coronary CTA to rule out obstructive coronary disease.  Olga Millers, MD

## 2023-03-29 ENCOUNTER — Ambulatory Visit: Payer: Medicare Other | Attending: Cardiology | Admitting: Cardiology

## 2023-03-29 ENCOUNTER — Encounter: Payer: Self-pay | Admitting: Cardiology

## 2023-03-29 VITALS — BP 129/89 | HR 64 | Ht 73.0 in | Wt 290.4 lb

## 2023-03-29 DIAGNOSIS — R072 Precordial pain: Secondary | ICD-10-CM | POA: Insufficient documentation

## 2023-03-29 DIAGNOSIS — R9431 Abnormal electrocardiogram [ECG] [EKG]: Secondary | ICD-10-CM | POA: Diagnosis not present

## 2023-03-29 DIAGNOSIS — I10 Essential (primary) hypertension: Secondary | ICD-10-CM | POA: Insufficient documentation

## 2023-03-29 DIAGNOSIS — R001 Bradycardia, unspecified: Secondary | ICD-10-CM | POA: Insufficient documentation

## 2023-03-29 MED ORDER — METOPROLOL TARTRATE 50 MG PO TABS: ORAL_TABLET | ORAL | 0 refills | Status: DC

## 2023-03-29 NOTE — Patient Instructions (Signed)
    Testing/Procedures:    Your cardiac CT will be scheduled at  [facility] [address] [place], [place] [address] [phone]    If scheduled at [facility], please arrive at the [facility] and Children's Entrance (Entrance C2) of [facility] 30 minutes prior to test start time. You can use the FREE valet parking offered at entrance C (encouraged to control the heart rate for the test)  Proceed to the [facility] Radiology Department (first floor) to check-in and test prep.  All radiology patients and guests should use entrance C2 at [facility], accessed from [facility], even though the hospital's physical address listed is [address]      Please follow these instructions carefully (unless otherwise directed):  An IV will be required for this test and Nitroglycerin will be given.  Hold all erectile dysfunction medications at least 3 days (72 hrs) prior to test. (Ie viagra, cialis, sildenafil, tadalafil, etc)   On the Night Before the Test: Be sure to Drink plenty of water. Do not consume any caffeinated/decaffeinated beverages or chocolate 12 hours prior to your test. Do not take any antihistamines 12 hours prior to your test.   On the Day of the Test: Drink plenty of water until 1 hour prior to the test. Do not eat any food 1 hour prior to test. You may take your regular medications prior to the test.  Take metoprolol (Lopressor) 50 mg two hours prior to test.       After the Test: Drink plenty of water. After receiving IV contrast, you may experience a mild flushed feeling. This is normal. On occasion, you may experience a mild rash up to 24 hours after the test. This is not dangerous. If this occurs, you can take Benadryl 25 mg and increase your fluid intake. If you experience trouble breathing, this can be serious. If it is severe call 911 IMMEDIATELY. If it is mild, please call our office.   We  will call to schedule your test 2-4 weeks out understanding that some insurance companies will need an authorization prior to the service being performed.   For more information and frequently asked questions, please visit our website : [email or web address]  For non-scheduling related questions, please contact the cardiac imaging nurse navigator should you have any questions/concerns: Cardiac Imaging Nurse Navigators Direct Office Dial: [phone]   For scheduling needs, including cancellations and rescheduling, please call [place], [phone].    Follow-Up: At [facility], you and your health needs are our priority.  As part of our continuing mission to provide you with exceptional heart care, we have created designated Provider Care Teams.  These Care Teams include your primary Cardiologist (physician) and Advanced Practice Providers (APPs -  Physician Assistants and Nurse Practitioners) who all work together to provide you with the care you need, when you need it.  We recommend signing up for the patient portal called "MyChart".  Sign up information is provided on this After Visit Summary.  MyChart is used to connect with patients for Virtual Visits (Telemedicine).  Patients are able to view lab/test results, encounter notes, upcoming appointments, etc.  Non-urgent messages can be sent to your provider as well.   To learn more about what you can do with MyChart, go to [email or web address].    Your next appointment:   12 month(s)  Provider:   Olga Millers MD

## 2023-03-31 ENCOUNTER — Encounter (HOSPITAL_COMMUNITY): Payer: Self-pay

## 2023-04-04 ENCOUNTER — Ambulatory Visit (HOSPITAL_COMMUNITY)
Admission: RE | Admit: 2023-04-04 | Discharge: 2023-04-04 | Disposition: A | Discharge status: Home / Self Care | Payer: Medicare Other | Source: Ambulatory Visit | Attending: Cardiology | Admitting: Cardiology

## 2023-04-04 DIAGNOSIS — R072 Precordial pain: Secondary | ICD-10-CM | POA: Diagnosis not present

## 2023-04-04 MED ORDER — NITROGLYCERIN 0.4 MG SL SUBL
SUBLINGUAL_TABLET | SUBLINGUAL | Status: AC
  Filled 2023-04-04: qty 2

## 2023-04-04 MED ORDER — NITROGLYCERIN 0.4 MG SL SUBL
0.8000 mg | SUBLINGUAL_TABLET | Freq: Once | SUBLINGUAL | Status: AC
  Administered 2023-04-04: 0.8 mg via SUBLINGUAL

## 2023-04-04 MED ORDER — IOHEXOL 350 MG/ML SOLN
115.0000 mL | Freq: Once | INTRAVENOUS | Status: AC | PRN
  Administered 2023-04-04: 115 mL via INTRAVENOUS

## 2023-04-05 ENCOUNTER — Other Ambulatory Visit (HOSPITAL_COMMUNITY): Payer: Self-pay | Admitting: *Deleted

## 2023-04-05 DIAGNOSIS — I7121 Aneurysm of the ascending aorta, without rupture: Secondary | ICD-10-CM

## 2023-04-05 DIAGNOSIS — E785 Hyperlipidemia, unspecified: Secondary | ICD-10-CM

## 2023-04-05 MED ORDER — ROSUVASTATIN CALCIUM 40 MG PO TABS: 40.0000 mg | ORAL_TABLET | Freq: Every day | ORAL | 3 refills | Status: DC

## 2023-04-10 ENCOUNTER — Encounter: Payer: Self-pay | Admitting: Cardiology

## 2023-04-17 ENCOUNTER — Other Ambulatory Visit: Payer: Self-pay | Admitting: *Deleted

## 2023-04-17 DIAGNOSIS — I2510 Atherosclerotic heart disease of native coronary artery without angina pectoris: Secondary | ICD-10-CM

## 2023-05-09 DIAGNOSIS — I2510 Atherosclerotic heart disease of native coronary artery without angina pectoris: Secondary | ICD-10-CM | POA: Diagnosis not present

## 2023-05-10 LAB — HEPATIC FUNCTION PANEL
ALT: 30 [IU]/L (ref 0–44)
AST: 26 [IU]/L (ref 0–40)
Albumin: 4.3 g/dL (ref 3.8–4.8)
Alkaline Phosphatase: 76 [IU]/L (ref 44–121)
Bilirubin Total: 0.6 mg/dL (ref 0.0–1.2)
Bilirubin, Direct: 0.19 mg/dL (ref 0.00–0.40)
Total Protein: 6.5 g/dL (ref 6.0–8.5)

## 2023-05-10 LAB — LIPID PANEL
Chol/HDL Ratio: 2.3 {ratio} (ref 0.0–5.0)
Cholesterol, Total: 82 mg/dL — ABNORMAL LOW (ref 100–199)
HDL: 35 mg/dL — ABNORMAL LOW (ref >39)
LDL Chol Calc (NIH): 24 mg/dL (ref 0–99)
Triglycerides: 128 mg/dL (ref 0–149)
VLDL Cholesterol Cal: 23 mg/dL (ref 5–40)

## 2023-05-11 DIAGNOSIS — E039 Hypothyroidism, unspecified: Secondary | ICD-10-CM | POA: Diagnosis not present

## 2023-05-23 ENCOUNTER — Other Ambulatory Visit (HOSPITAL_BASED_OUTPATIENT_CLINIC_OR_DEPARTMENT_OTHER): Payer: Self-pay

## 2023-05-23 DIAGNOSIS — Z23 Encounter for immunization: Secondary | ICD-10-CM | POA: Diagnosis not present

## 2023-05-23 MED ORDER — COMIRNATY 30 MCG/0.3ML IM SUSY
0.3000 mL | PREFILLED_SYRINGE | Freq: Once | INTRAMUSCULAR | 0 refills | Status: AC
  Filled 2023-05-23: qty 0.3, 1d supply, fill #0

## 2023-05-23 MED ORDER — FLUAD 0.5 ML IM SUSY
0.5000 mL | PREFILLED_SYRINGE | Freq: Once | INTRAMUSCULAR | 0 refills | Status: AC
  Filled 2023-05-23: qty 0.5, 1d supply, fill #0

## 2023-05-29 ENCOUNTER — Other Ambulatory Visit: Payer: Self-pay | Admitting: Medical

## 2023-05-29 MED ORDER — LEVOCETIRIZINE DIHYDROCHLORIDE 5 MG PO TABS: 5.0000 mg | ORAL_TABLET | Freq: Every evening | ORAL | 0 refills | Status: DC

## 2023-06-16 ENCOUNTER — Ambulatory Visit (INDEPENDENT_AMBULATORY_CARE_PROVIDER_SITE_OTHER): Payer: Medicare Other | Admitting: *Deleted

## 2023-06-16 DIAGNOSIS — Z0000 Encounter for general adult medical examination without abnormal findings: Secondary | ICD-10-CM | POA: Diagnosis not present

## 2023-06-16 NOTE — Progress Notes (Signed)
Subjective:   Jackson French is a 72 y.o. male who presents for Medicare Annual/Subsequent preventive examination.  Visit Complete: Virtual I connected with  Trudie Buckler on 06/16/23 by a audio enabled telemedicine application and verified that I am speaking with the correct person using two identifiers.  Patient Location: Home  Provider Location: Office/Clinic  I discussed the limitations of evaluation and management by telemedicine. The patient expressed understanding and agreed to proceed.  Vital Signs: Because this visit was a virtual/telehealth visit, some criteria may be missing or patient reported. Any vitals not documented were not able to be obtained and vitals that have been documented are patient reported.  Patient Medicare AWV questionnaire was completed by the patient on 06/12/23; I have confirmed that all information answered by patient is correct and no changes since this date.  Cardiac Risk Factors include: advanced age (>33men, >64 women);male gender;dyslipidemia;obesity (BMI >30kg/m2);hypertension     Objective:    There were no vitals filed for this visit. There is no height or weight on file to calculate BMI.     06/16/2023    9:11 AM 06/14/2022    9:02 AM 06/08/2021    9:08 AM  Advanced Directives  Does Patient Have a Medical Advance Directive? Yes Yes Yes  Type of Estate agent of [place];Living will Healthcare Power of [place];Living will Healthcare Power of [place];Living will  Does patient want to make changes to medical advance directive? No - Patient declined No - Patient declined Yes (MAU/Ambulatory/Procedural Areas - Information given)  Copy of Healthcare Power of Attorney in Chart? No - copy requested No - copy requested No - copy requested    Current Medications (verified) Outpatient Encounter Medications as of 06/16/2023  Medication Sig   cyanocobalamin (VITAMIN B12) 1000 MCG tablet Take 1 tablet by mouth daily.    levocetirizine (XYZAL) 5 MG tablet Take 1 tablet (5 mg total) by mouth every evening.   levothyroxine (SYNTHROID) 137 MCG tablet Take 1 tablet (137 mcg total) by mouth daily before breakfast.   losartan (COZAAR) 50 MG tablet TAKE 1 TABLET(50 MG) BY MOUTH DAILY   metoprolol tartrate (LOPRESSOR) 50 MG tablet Take 2 hours prior to CT scan   rosuvastatin (CRESTOR) 40 MG tablet Take 1 tablet (40 mg total) by mouth daily.   sildenafil (VIAGRA) 50 MG tablet Take 1 tablet (50 mg total) by mouth daily as needed for erectile dysfunction.   [DISCONTINUED] ciprofloxacin (CIPRO) 500 MG tablet Take 1 tablet (500 mg total) by mouth 2 (two) times daily.   [DISCONTINUED] methylPREDNISolone (MEDROL) 4 MG tablet Standard 6 day dose pack   [DISCONTINUED] potassium chloride (KLOR-CON M) 10 MEQ tablet Take 1 tablet (10 mEq total) by mouth daily.   No facility-administered encounter medications on file as of 06/16/2023.    Allergies (verified) Patient has no known allergies.   History: Past Medical History:  Diagnosis Date   Allergy    Cataract removed IOLs ou   GERD (gastroesophageal reflux disease) treated by gastro   Gout    Hyperlipidemia    Hypertension    Sleep apnea    no CPAP   Thyroid disease    Past Surgical History:  Procedure Laterality Date   APPENDECTOMY     CATARACT EXTRACTION, BILATERAL Bilateral    COLONOSCOPY  04/19/2022   EYE SURGERY     FRACTURE SURGERY     HYDROCELE EXCISION / REPAIR     THYROID SURGERY     TONSILLECTOMY  VASECTOMY     Family History  Problem Relation Age of Onset   Arthritis Mother    Breast cancer Mother    Stomach cancer Father 71       duodenal cancer   Heart attack Father    Cancer - Colon Neg Hx    Colon cancer Neg Hx    Rectal cancer Neg Hx    Esophageal cancer Neg Hx    Social History   Socioeconomic History   Marital status: Married    Spouse name: Not on file   Number of children: 3   Years of education: Not on file   Highest  education level: Professional school degree (e.g., MD, DDS, DVM, JD)  Occupational History   Not on file  Tobacco Use   Smoking status: Never   Smokeless tobacco: Never  Vaping Use   Vaping status: Never Used  Substance and Sexual Activity   Alcohol use: Yes    Alcohol/week: 2.0 standard drinks of alcohol    Types: 2 Glasses of wine per week    Comment: rarely   Drug use: Never   Sexual activity: Yes  Other Topics Concern   Not on file  Social History Narrative   Not on file   Social Determinants of Health   Financial Resource Strain: Low Risk  (06/12/2023)   Overall Financial Resource Strain (CARDIA)    Difficulty of Paying Living Expenses: Not very hard  Food Insecurity: No Food Insecurity (06/12/2023)   Hunger Vital Sign    Worried About Running Out of Food in the Last Year: Never true    Ran Out of Food in the Last Year: Never true  Transportation Needs: No Transportation Needs (06/12/2023)   PRAPARE - Administrator, Civil Service (Medical): No    Lack of Transportation (Non-Medical): No  Physical Activity: Insufficiently Active (06/12/2023)   Exercise Vital Sign    Days of Exercise per Week: 2 days    Minutes of Exercise per Session: 30 min  Stress: No Stress Concern Present (06/12/2023)   [employer] of Occupational Health - Occupational Stress Questionnaire    Feeling of Stress : Not at all  Social Connections: Moderately Integrated (06/12/2023)   Social Connection and Isolation Panel [NHANES]    Frequency of Communication with Friends and Family: Twice a week    Frequency of Social Gatherings with Friends and Family: Three times a week    Attends Religious Services: 1 to 4 times per year    Active Member of Clubs or Organizations: No    Attends Banker Meetings: Never    Marital Status: Married    Tobacco Counseling Counseling given: Not Answered   Clinical Intake:  Pre-visit preparation completed: Yes  Pain : No/denies  pain  Nutritional Risks: None Diabetes: No  How often do you need to have someone help you when you read instructions, pamphlets, or other written materials from your doctor or pharmacy?: 1 - Never  Interpreter Needed?: No  Information entered by :: Donne Anon, CMA   Activities of Daily Living    06/12/2023    9:51 AM  In your present state of health, do you have any difficulty performing the following activities:  Hearing? 0  Vision? 0  Difficulty concentrating or making decisions? 0  Walking or climbing stairs? 0  Dressing or bathing? 0  Doing errands, shopping? 0  Preparing Food and eating ? N  Using the Toilet? N  In the past six  months, have you accidently leaked urine? N  Do you have problems with loss of bowel control? N  Managing your Medications? N  Managing your Finances? N  Housekeeping or managing your Housekeeping? N    Patient Care Team: Saguier, Kateri Mc as PCP - General (Internal Medicine) Jens Som Madolyn Frieze, MD as PCP - Cardiology (Cardiology) Crista Elliot, MD as Consulting Physician (Urology)  Indicate any recent Medical Services you may have received from other than [facility] providers in the past year (date may be approximate).     Assessment:   This is a routine wellness examination for Demian.  Hearing/Vision screen No results found.   Goals Addressed   None    Depression Screen    06/16/2023    9:12 AM 06/14/2022    9:03 AM 06/08/2021    9:10 AM 04/01/2021    8:59 AM 10/13/2020    8:03 AM  PHQ 2/9 Scores  PHQ - 2 Score 0 0 0 0 0    Fall Risk    06/12/2023    9:51 AM 12/28/2022    1:02 PM 06/14/2022    9:03 AM 06/08/2021    9:09 AM 04/01/2021    8:59 AM  Fall Risk   Falls in the past year? 0 0 0 0 1  Number falls in past yr: 0 0 0 0 1  Injury with Fall? 0 0 0 0 0  Risk for fall due to : No Fall Risks No Fall Risks No Fall Risks    Follow up Falls evaluation completed Falls evaluation completed;Education provided Falls  evaluation completed Falls prevention discussed     MEDICARE RISK AT HOME: Medicare Risk at Home Any stairs in or around the home?: Yes If so, are there any without handrails?: No Home free of loose throw rugs in walkways, pet beds, electrical cords, etc?: Yes Adequate lighting in your home to reduce risk of falls?: Yes Life alert?: No Use of a cane, walker or w/c?: No Grab bars in the bathroom?: Yes Shower chair or bench in shower?: Yes Elevated toilet seat or a handicapped toilet?: No  TIMED UP AND GO:  Was the test performed?  No    Cognitive Function:        06/16/2023    9:13 AM 06/14/2022    9:09 AM  6CIT Screen  What Year? 0 points 0 points  What month? 0 points 0 points  What time? 0 points 0 points  Count back from 20 0 points 0 points  Months in reverse 0 points 0 points  Repeat phrase 0 points 0 points  Total Score 0 points 0 points    Immunizations Immunization History  Administered Date(s) Administered   Fluad Quad(high Dose 65+) 05/17/2016, 05/31/2017, 05/25/2019, 05/20/2021, 05/20/2022   Fluad Trivalent(High Dose 65+) 05/23/2023   Influenza Split 05/14/2013   Influenza, High Dose Seasonal PF 05/16/2018   Influenza,trivalent, recombinat, inj, PF 05/14/2013   Influenza-Unspecified 05/17/2016, 05/31/2017   Moderna Covid-19 Vaccine Bivalent Booster 75yrs & up 05/20/2021   Moderna SARS-COV2 Booster Vaccination 06/16/2020   Moderna Sars-Covid-2 Vaccination 09/12/2019, 10/15/2019   PFIZER Comirnaty(Gray Top)Covid-19 Tri-Sucrose Vaccine 05/20/2022   Pfizer(Comirnaty)Fall Seasonal Vaccine 12 years and older 05/23/2023    TDAP status: Up to date  Flu Vaccine status: Up to date  Pneumococcal vaccine status: Due, Education has been provided regarding the importance of this vaccine. Advised may receive this vaccine at local pharmacy or Health Dept. Aware to provide a copy of the vaccination  record if obtained from local pharmacy or Health Dept. Verbalized  acceptance and understanding.  Covid-19 vaccine status: Information provided on how to obtain vaccines.   Qualifies for Shingles Vaccine? Yes   Zostavax completed No   Shingrix Completed?: No.    Education has been provided regarding the importance of this vaccine. Patient has been advised to call insurance company to determine out of pocket expense if they have not yet received this vaccine. Advised may also receive vaccine at local pharmacy or Health Dept. Verbalized acceptance and understanding.  Screening Tests Health Maintenance  Topic Date Due   Hepatitis C Screening  Never done   DTaP/Tdap/Td (1 - Tdap) Never done   Zoster Vaccines- Shingrix (1 of 2) Never done   Pneumonia Vaccine 14+ Years old (1 of 1 - PCV) Never done   Medicare Annual Wellness (AWV)  06/15/2023   Colonoscopy  04/21/2025   INFLUENZA VACCINE  Completed   HPV VACCINES  Aged Out   COVID-19 Vaccine  Discontinued    Health Maintenance  Health Maintenance Due  Topic Date Due   Hepatitis C Screening  Never done   DTaP/Tdap/Td (1 - Tdap) Never done   Zoster Vaccines- Shingrix (1 of 2) Never done   Pneumonia Vaccine 38+ Years old (1 of 1 - PCV) Never done   Medicare Annual Wellness (AWV)  06/15/2023    Colorectal cancer screening: Type of screening: Colonoscopy. Completed 04/21/22. Repeat every 3 years  Lung Cancer Screening: (Low Dose CT Chest recommended if Age 31-80 years, 20 pack-year currently smoking OR have quit w/in 15years.) does not qualify.   Additional Screening:  Hepatitis C Screening: does qualify; Completed N/a  Vision Screening: Recommended annual ophthalmology exams for early detection of glaucoma and other disorders of the eye. Is the patient up to date with their annual eye exam?  No  Who is the provider or what is the name of the office in which the patient attends annual eye exams? Doesn't have eye doctor at this time If pt is not established with a provider, would they like to be  referred to a provider to establish care? No .   Dental Screening: Recommended annual dental exams for proper oral hygiene  Diabetic Foot Exam: N/a  Community Resource Referral / Chronic Care Management: CRR required this visit?  No   CCM required this visit?  No     Plan:     I have personally reviewed and noted the following in the patient's chart:   Medical and social history Use of alcohol, tobacco or illicit drugs  Current medications and supplements including opioid prescriptions. Patient is not currently taking opioid prescriptions. Functional ability and status Nutritional status Physical activity Advanced directives List of other physicians Hospitalizations, surgeries, and ER visits in previous 12 months Vitals Screenings to include cognitive, depression, and falls Referrals and appointments  In addition, I have reviewed and discussed with patient certain preventive protocols, quality metrics, and best practice recommendations. A written personalized care plan for preventive services as well as general preventive health recommendations were provided to patient.     Donne Anon, CMA   06/16/2023   After Visit Summary: (MyChart) Due to this being a telephonic visit, the after visit summary with patients personalized plan was offered to patient via MyChart   Nurse Notes: None

## 2023-06-16 NOTE — Patient Instructions (Signed)
Jackson French , Thank you for taking time to come for your Medicare Wellness Visit. I appreciate your ongoing commitment to your health goals. Please review the following plan we discussed and let me know if I can assist you in the future.     This is a list of the screening recommended for you and due dates:  Health Maintenance  Topic Date Due   Hepatitis C Screening  Never done   DTaP/Tdap/Td vaccine (1 - Tdap) Never done   Zoster (Shingles) Vaccine (1 of 2) Never done   Pneumonia Vaccine (1 of 1 - PCV) Never done   Medicare Annual Wellness Visit  06/15/2024   Colon Cancer Screening  04/21/2025   Flu Shot  Completed   HPV Vaccine  Aged Out   COVID-19 Vaccine  Discontinued    Next appointment: Follow up in one year for your annual wellness visit.   Preventive Care 72 Years and Older, Male Preventive care refers to lifestyle choices and visits with your health care provider that can promote health and wellness. What does preventive care include? A yearly physical exam. This is also called an annual well check. Dental exams once or twice a year. Routine eye exams. Ask your health care provider how often you should have your eyes checked. Personal lifestyle choices, including: Daily care of your teeth and gums. Regular physical activity. Eating a healthy diet. Avoiding tobacco and drug use. Limiting alcohol use. Practicing safe sex. Taking low doses of aspirin every day. Taking vitamin and mineral supplements as recommended by your health care provider. What happens during an annual well check? The services and screenings done by your health care provider during your annual well check will depend on your age, overall health, lifestyle risk factors, and family history of disease. Counseling  Your health care provider may ask you questions about your: Alcohol use. Tobacco use. Drug use. Emotional well-being. Home and relationship well-being. Sexual activity. Eating  habits. History of falls. Memory and ability to understand (cognition). Work and work Astronomer. Screening  You may have the following tests or measurements: Height, weight, and BMI. Blood pressure. Lipid and cholesterol levels. These may be checked every 5 years, or more frequently if you are over 25 years old. Skin check. Lung cancer screening. You may have this screening every year starting at age 82 if you have a 30-pack-year history of smoking and currently smoke or have quit within the past 15 years. Fecal occult blood test (FOBT) of the stool. You may have this test every year starting at age 63. Flexible sigmoidoscopy or colonoscopy. You may have a sigmoidoscopy every 5 years or a colonoscopy every 10 years starting at age 45. Prostate cancer screening. Recommendations will vary depending on your family history and other risks. Hepatitis C blood test. Hepatitis B blood test. Sexually transmitted disease (STD) testing. Diabetes screening. This is done by checking your blood sugar (glucose) after you have not eaten for a while (fasting). You may have this done every 1-3 years. Abdominal aortic aneurysm (AAA) screening. You may need this if you are a current or former smoker. Osteoporosis. You may be screened starting at age 49 if you are at high risk. Talk with your health care provider about your test results, treatment options, and if necessary, the need for more tests. Vaccines  Your health care provider may recommend certain vaccines, such as: Influenza vaccine. This is recommended every year. Tetanus, diphtheria, and acellular pertussis (Tdap, Td) vaccine. You may need a  Td booster every 10 years. Zoster vaccine. You may need this after age 69. Pneumococcal 13-valent conjugate (PCV13) vaccine. One dose is recommended after age 76. Pneumococcal polysaccharide (PPSV23) vaccine. One dose is recommended after age 68. Talk to your health care provider about which screenings and  vaccines you need and how often you need them. This information is not intended to replace advice given to you by your health care provider. Make sure you discuss any questions you have with your health care provider. Document Released: 08/21/2015 Document Revised: 04/13/2016 Document Reviewed: 05/26/2015 Elsevier Interactive Patient Education  2017 [employer].  Fall Prevention in the Home Falls can cause injuries. They can happen to people of all ages. There are many things you can do to make your home safe and to help prevent falls. What can I do on the outside of my home? Regularly fix the edges of walkways and driveways and fix any cracks. Remove anything that might make you trip as you walk through a door, such as a raised step or threshold. Trim any bushes or trees on the path to your home. Use bright outdoor lighting. Clear any walking paths of anything that might make someone trip, such as rocks or tools. Regularly check to see if handrails are loose or broken. Make sure that both sides of any steps have handrails. Any raised decks and porches should have guardrails on the edges. Have any leaves, snow, or ice cleared regularly. Use sand or salt on walking paths during winter. Clean up any spills in your garage right away. This includes oil or grease spills. What can I do in the bathroom? Use night lights. Install grab bars by the toilet and in the tub and shower. Do not use towel bars as grab bars. Use non-skid mats or decals in the tub or shower. If you need to sit down in the shower, use a plastic, non-slip stool. Keep the floor dry. Clean up any water that spills on the floor as soon as it happens. Remove soap buildup in the tub or shower regularly. Attach bath mats securely with double-sided non-slip rug tape. Do not have throw rugs and other things on the floor that can make you trip. What can I do in the bedroom? Use night lights. Make sure that you have a light by your  bed that is easy to reach. Do not use any sheets or blankets that are too big for your bed. They should not hang down onto the floor. Have a firm chair that has side arms. You can use this for support while you get dressed. Do not have throw rugs and other things on the floor that can make you trip. What can I do in the kitchen? Clean up any spills right away. Avoid walking on wet floors. Keep items that you use a lot in easy-to-reach places. If you need to reach something above you, use a strong step stool that has a grab bar. Keep electrical cords out of the way. Do not use floor polish or wax that makes floors slippery. If you must use wax, use non-skid floor wax. Do not have throw rugs and other things on the floor that can make you trip. What can I do with my stairs? Do not leave any items on the stairs. Make sure that there are handrails on both sides of the stairs and use them. Fix handrails that are broken or loose. Make sure that handrails are as long as the stairways. Check any  carpeting to make sure that it is firmly attached to the stairs. Fix any carpet that is loose or worn. Avoid having throw rugs at the top or bottom of the stairs. If you do have throw rugs, attach them to the floor with carpet tape. Make sure that you have a light switch at the top of the stairs and the bottom of the stairs. If you do not have them, ask someone to add them for you. What else can I do to help prevent falls? Wear shoes that: Do not have high heels. Have rubber bottoms. Are comfortable and fit you well. Are closed at the toe. Do not wear sandals. If you use a stepladder: Make sure that it is fully opened. Do not climb a closed stepladder. Make sure that both sides of the stepladder are locked into place. Ask someone to hold it for you, if possible. Clearly mark and make sure that you can see: Any grab bars or handrails. First and last steps. Where the edge of each step is. Use tools that  help you move around (mobility aids) if they are needed. These include: Canes. Walkers. Scooters. Crutches. Turn on the lights when you go into a dark area. Replace any light bulbs as soon as they burn out. Set up your furniture so you have a clear path. Avoid moving your furniture around. If any of your floors are uneven, fix them. If there are any pets around you, be aware of where they are. Review your medicines with your doctor. Some medicines can make you feel dizzy. This can increase your chance of falling. Ask your doctor what other things that you can do to help prevent falls. This information is not intended to replace advice given to you by your health care provider. Make sure you discuss any questions you have with your health care provider. Document Released: 05/21/2009 Document Revised: 12/31/2015 Document Reviewed: 08/29/2014 Elsevier Interactive Patient Education  2017 [employer].

## 2023-06-25 ENCOUNTER — Encounter: Payer: Self-pay | Admitting: Cardiology

## 2023-06-26 ENCOUNTER — Other Ambulatory Visit: Payer: Self-pay

## 2023-06-26 MED ORDER — ROSUVASTATIN CALCIUM 40 MG PO TABS: 40.0000 mg | ORAL_TABLET | Freq: Every day | ORAL | 2 refills | Status: DC

## 2023-06-26 NOTE — Telephone Encounter (Signed)
 RX sent to requested Pharmacy

## 2023-06-28 ENCOUNTER — Encounter: Payer: Self-pay | Admitting: Medical

## 2023-07-11 ENCOUNTER — Encounter: Payer: Self-pay | Admitting: Medical

## 2023-09-07 ENCOUNTER — Encounter: Payer: Self-pay | Admitting: Cardiology

## 2023-09-24 ENCOUNTER — Encounter: Payer: Self-pay | Admitting: Medical

## 2023-09-24 IMAGING — DX DG LUMBAR SPINE 2-3V
3 series · 3 of 3 positions shown · non-contrast
Comparison: None.

CLINICAL DATA: Low back pain with right-sided sciatica.

EXAM:
LUMBAR SPINE - 2-3 VIEW

[l-spine ap]
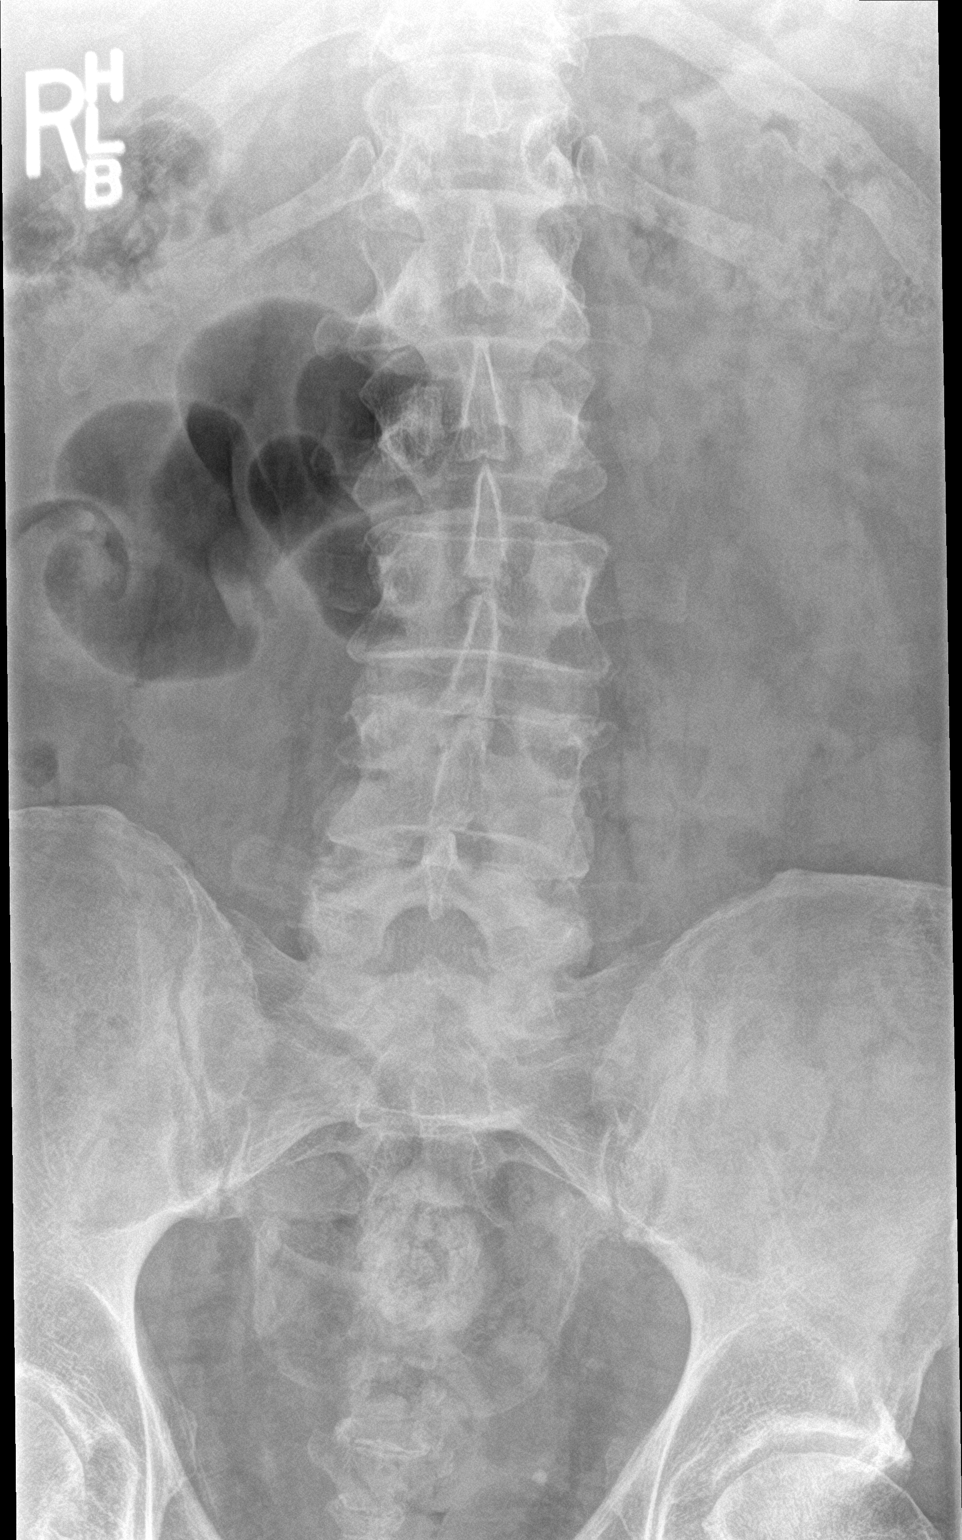

[l-spine lat]
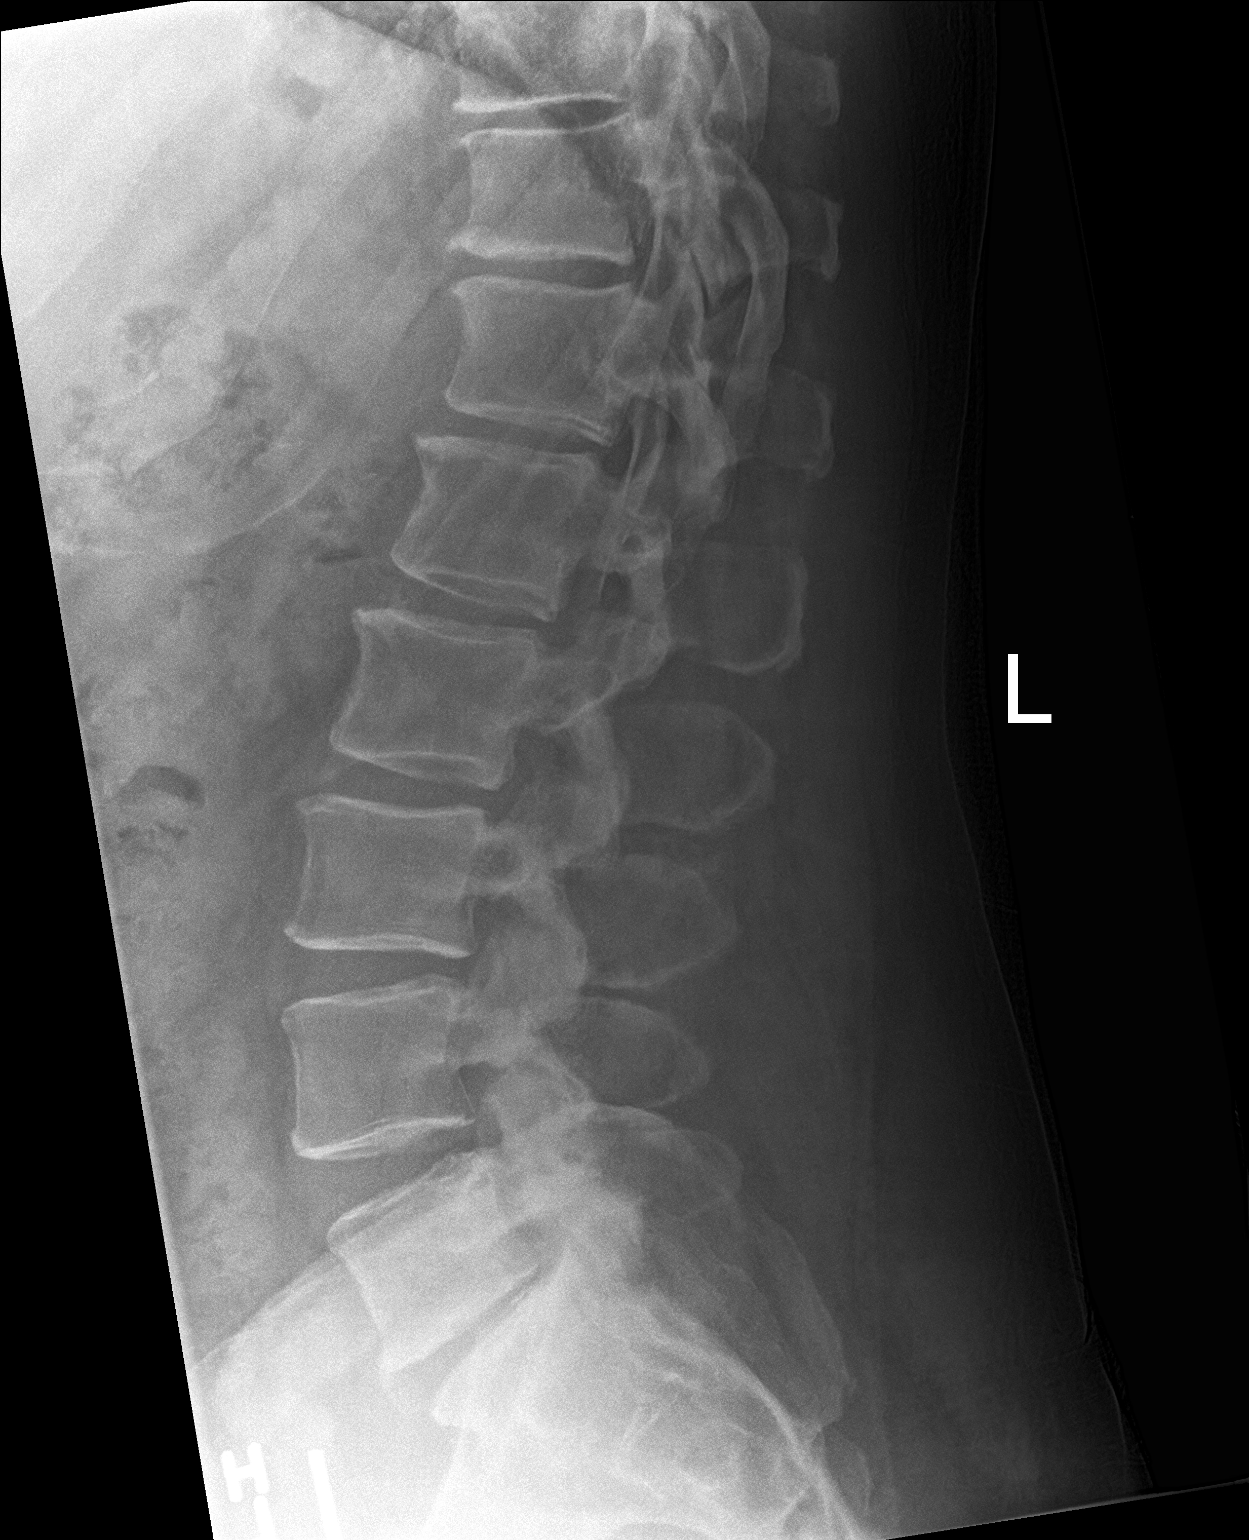

[l-spine spot]
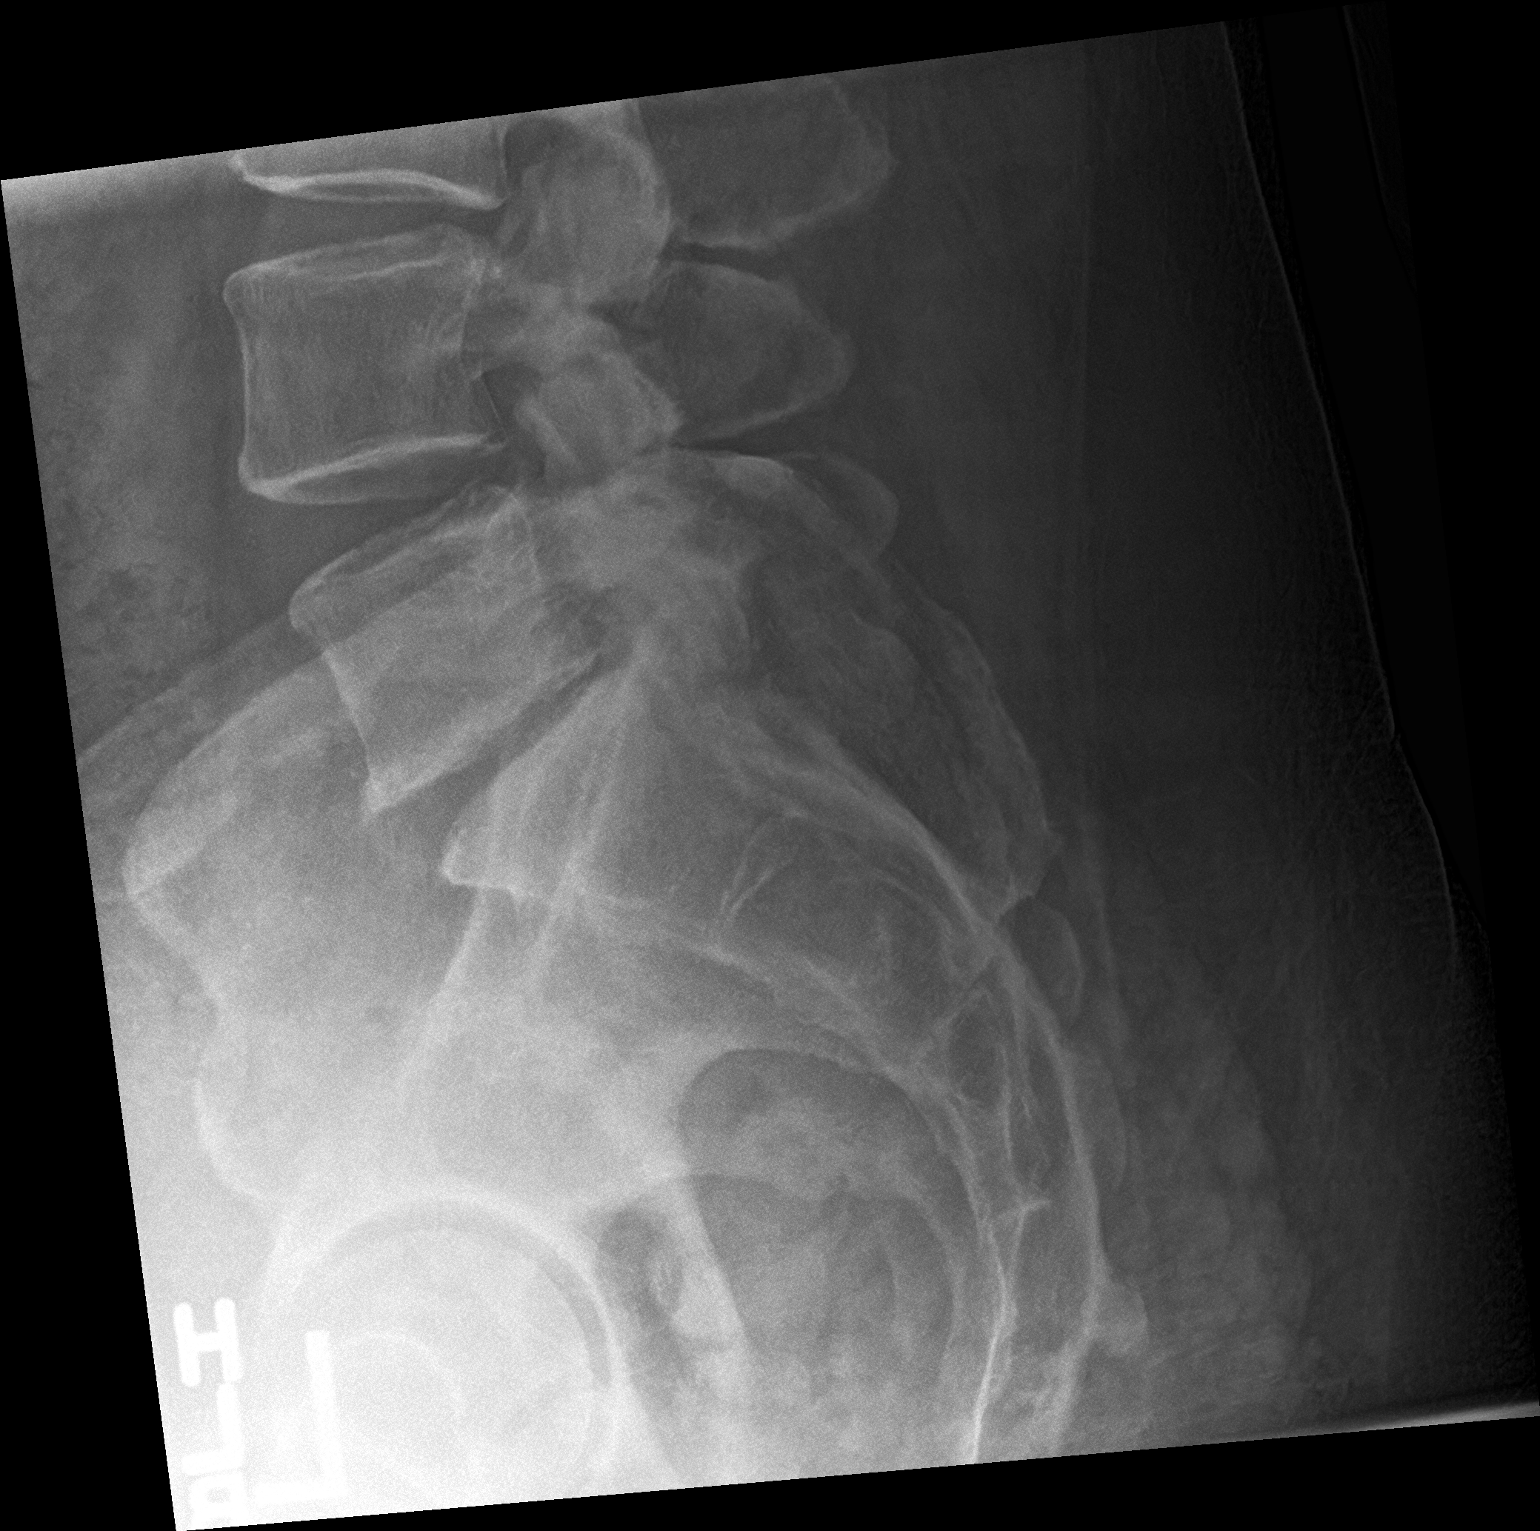

[3 of 3 positions shown; findings below may reference images not displayed]

FINDINGS: There is no evidence of lumbar spine fracture. Alignment is normal.
Moderate degenerative disc disease is noted at L5-S1 with anterior
osteophyte formation.
IMPRESSION: Moderate degenerative disc disease at L5-S1. No acute abnormality
seen.

## 2023-10-02 ENCOUNTER — Ambulatory Visit (HOSPITAL_BASED_OUTPATIENT_CLINIC_OR_DEPARTMENT_OTHER)
Admission: RE | Admit: 2023-10-02 | Discharge: 2023-10-02 | Disposition: A | Discharge status: Home / Self Care | Payer: Medicare Other | Source: Ambulatory Visit | Attending: Cardiology | Admitting: Cardiology

## 2023-10-02 ENCOUNTER — Encounter (HOSPITAL_BASED_OUTPATIENT_CLINIC_OR_DEPARTMENT_OTHER): Payer: Self-pay

## 2023-10-02 DIAGNOSIS — I7121 Aneurysm of the ascending aorta, without rupture: Secondary | ICD-10-CM | POA: Diagnosis not present

## 2023-10-02 DIAGNOSIS — I2510 Atherosclerotic heart disease of native coronary artery without angina pectoris: Secondary | ICD-10-CM | POA: Diagnosis not present

## 2023-10-02 MED ORDER — IOHEXOL 350 MG/ML SOLN
100.0000 mL | Freq: Once | INTRAVENOUS | Status: AC | PRN
  Administered 2023-10-02: 100 mL via INTRAVENOUS

## 2023-10-05 ENCOUNTER — Encounter: Payer: Self-pay | Admitting: Cardiology

## 2023-10-07 ENCOUNTER — Other Ambulatory Visit: Payer: Self-pay | Admitting: Medical

## 2023-10-09 ENCOUNTER — Encounter: Payer: Self-pay | Admitting: *Deleted

## 2023-10-09 ENCOUNTER — Encounter: Payer: Self-pay | Admitting: Medical

## 2023-10-09 MED ORDER — LEVOCETIRIZINE DIHYDROCHLORIDE 5 MG PO TABS: 5.0000 mg | ORAL_TABLET | Freq: Every evening | ORAL | 0 refills | Status: DC

## 2023-10-10 ENCOUNTER — Other Ambulatory Visit: Payer: Self-pay | Admitting: *Deleted

## 2023-10-10 DIAGNOSIS — I77810 Thoracic aortic ectasia: Secondary | ICD-10-CM

## 2023-10-12 ENCOUNTER — Other Ambulatory Visit: Payer: Self-pay | Admitting: Medical

## 2023-10-16 ENCOUNTER — Encounter: Payer: Self-pay | Admitting: Pulmonary Disease

## 2023-11-06 ENCOUNTER — Ambulatory Visit: Payer: Self-pay

## 2023-11-06 NOTE — Telephone Encounter (Signed)
 Chief Complaint: low back pain Symptoms: back pain,  Frequency: ongoing issue Pertinent Negatives: Patient denies GU s/s, fever, weakness, CMS changes-unless standing for long periods, walking long distances Disposition: [] ED /[] Urgent Care (no appt availability in office) / [x] Appointment(In office/virtual)/ []  [facility]/ [] Home Care/ [] Refused Recommended Disposition /[] [facility]/ []  Follow-up with PCP Additional Notes: Pt states that he has had ongoing issues with his back, PCP is aware of the back pain. Pt states he can walk about 500 feet and requires a resting sitting period, his back pain resolves and he is able to do another 500 ft. Pt states that he was also at the dentist this morning and was found to have a BP of 153/98. Pt states that he is concerned about a high BP as he has an abd aneurysm. Pt offered appt today, pt refused. Pt sched tomorrow with PCP.   Copied from CRM [id number]. Topic: Clinical - Red Word Triage >> Nov 06, 2023  9:59 AM Mackie Pai E wrote: [facility] that prompted transfer to Nurse Triage: Back pain. Patient has been experiencing lower back pain, stated that when he is walking it can get up to a level 8 out of 10. Patient also had a dentist appt this morning where is blood pressure read 153/98, but was not experiencing symptoms related to that. Reason for Disposition  [1] MODERATE back pain (e.g., interferes with normal activities) AND [2] present > 3 days  Answer Assessment - Initial Assessment Questions 1. ONSET: "When did the pain begin?"      Its been ongoing 2. LOCATION: "Where does it hurt?" (upper, mid or lower back)     low 3. SEVERITY: "How bad is the pain?"  (e.g., Scale 1-10; mild, moderate, or severe)   - MILD (1-3): Doesn't interfere with normal activities.    - MODERATE (4-7): Interferes with normal activities or awakens from sleep.    - SEVERE (8-10): Excruciating pain, unable to do any normal activities.      8 once I walk  about 500 steps, after rest period the pain resolves and I can go another 500 feet, I was walking about a mile a day about 6 months 4. PATTERN: "Is the pain constant?" (e.g., yes, no; constant, intermittent)      Intermittent with amount of activity 5. RADIATION: "Does the pain shoot into your legs or somewhere else?"     Goes into legs 6. CAUSE:  "What do you think is causing the back pain?"      Nerve issues 7. BACK OVERUSE:  "Any recent lifting of heavy objects, strenuous work or exercise?"     denies 8. MEDICINES: "What have you taken so far for the pain?" (e.g., nothing, acetaminophen, NSAIDS)     denies 9. NEUROLOGIC SYMPTOMS: "Do you have any weakness, numbness, or problems with bowel/bladder control?"     Numbness in legs if I stand for too long 10. OTHER SYMPTOMS: "Do you have any other symptoms?" (e.g., fever, abdomen pain, burning with urination, blood in urine)       denies  Protocols used: Back Pain-A-AH

## 2023-11-07 ENCOUNTER — Ambulatory Visit (INDEPENDENT_AMBULATORY_CARE_PROVIDER_SITE_OTHER): Admitting: Medical

## 2023-11-07 ENCOUNTER — Encounter: Payer: Self-pay | Admitting: Medical

## 2023-11-07 ENCOUNTER — Encounter (INDEPENDENT_AMBULATORY_CARE_PROVIDER_SITE_OTHER): Payer: Self-pay

## 2023-11-07 VITALS — BP 130/78 | HR 68 | Resp 18 | Ht 73.0 in | Wt 302.0 lb

## 2023-11-07 DIAGNOSIS — I10 Essential (primary) hypertension: Secondary | ICD-10-CM

## 2023-11-07 DIAGNOSIS — I712 Thoracic aortic aneurysm, without rupture, unspecified: Secondary | ICD-10-CM

## 2023-11-07 DIAGNOSIS — E039 Hypothyroidism, unspecified: Secondary | ICD-10-CM | POA: Diagnosis not present

## 2023-11-07 DIAGNOSIS — M545 Low back pain, unspecified: Secondary | ICD-10-CM | POA: Diagnosis not present

## 2023-11-07 NOTE — Progress Notes (Signed)
 Subjective:    Patient ID: Jackson French, male    DOB: 04-19-1951, 73 y.o.   MRN: [id number]  HPI  Discussed the use of AI scribe software for clinical note transcription with the patient, who gave verbal consent to proceed.  History of Present Illness   Jackson French is a 73 year old male with a history of back pain and thoracic aortic aneurysm who presents with worsening back pain and concerns about physical activity limitations.  He has a history of back pain that has been worsening over time. An x-ray on November 17, 2021, showed moderate degenerative changes at L5-S1. He experiences pain after walking approximately 500 steps, requiring him to sit for relief. Recently, he attended a play and experienced back pain and numbness behind his legs after standing for 20-30 minutes. The numbness is a new feature that occurs with prolonged standing or activity.  He has a thoracic aortic aneurysm discovered after an abnormal EKG with depressed S and T waves led to a CT scan. The aneurysm measures between 4.5 to 4.8 cm. He has not had direct communication with the cardiologist since the initial visit, receiving updates through the nurse, and is scheduled for another CT scan in six months. He has been advised not to lift more than 30 pounds, which has limited his gym activities and contributed to increased back pain due to reduced physical activity.  Additionally, he experiences upper thigh pain, particularly when sleeping on his left side, which he attributes to pinching a nerve. This pain resolves when he changes position. No pain radiating to his feet, but the thigh pain has been present for about six months.  He is currently taking losartan and is monitoring his blood pressure at home. He is concerned about his weight and its impact on his health, expressing uncertainty about dietary changes and considering consulting a dietitian.           Review of Systems  Constitutional:  Negative for  chills, fatigue and fever.  HENT:  Negative for dental problem.   Respiratory:  Negative for cough, chest tightness, shortness of breath and wheezing.   Cardiovascular:  Negative for chest pain and palpitations.  Gastrointestinal:  Negative for abdominal pain, blood in stool and constipation.  Genitourinary:  Negative for dysuria.  Musculoskeletal:  Positive for back pain.       Lumbar back pain.  Skin:  Negative for rash.  Neurological:  Negative for facial asymmetry, speech difficulty and weakness.  Hematological:  Negative for adenopathy.  Psychiatric/Behavioral:  Negative for behavioral problems, decreased concentration and hallucinations. The patient is not nervous/anxious.    Past Medical History:  Diagnosis Date   Allergy    Cataract removed IOLs ou   GERD (gastroesophageal reflux disease) treated by gastro   Gout    Hyperlipidemia    Hypertension    Sleep apnea    no CPAP   Thyroid disease      Social History   Socioeconomic History   Marital status: Married    Spouse name: Not on file   Number of children: 3   Years of education: Not on file   Highest education level: Professional school degree (e.g., MD, DDS, DVM, JD)  Occupational History   Not on file  Tobacco Use   Smoking status: Never   Smokeless tobacco: Never  Vaping Use   Vaping status: Never Used  Substance and Sexual Activity   Alcohol use: Yes    Alcohol/week: 2.0 standard drinks of  alcohol    Types: 2 Glasses of wine per week    Comment: rarely   Drug use: Never   Sexual activity: Yes  Other Topics Concern   Not on file  Social History Narrative   Not on file   Social Drivers of Health   Financial Resource Strain: Low Risk  (06/12/2023)   Overall Financial Resource Strain (CARDIA)    Difficulty of Paying Living Expenses: Not very hard  Food Insecurity: No Food Insecurity (11/06/2023)   Hunger Vital Sign    Worried About Running Out of Food in the Last Year: Never true    Ran Out of Food  in the Last Year: Never true  Transportation Needs: No Transportation Needs (11/06/2023)   PRAPARE - Administrator, Civil Service (Medical): No    Lack of Transportation (Non-Medical): No  Physical Activity: Insufficiently Active (11/06/2023)   Exercise Vital Sign    Days of Exercise per Week: 2 days    Minutes of Exercise per Session: 20 min  Stress: No Stress Concern Present (11/06/2023)   [employer] of Occupational Health - Occupational Stress Questionnaire    Feeling of Stress : Only a little  Social Connections: Moderately Integrated (11/06/2023)   Social Connection and Isolation Panel [NHANES]    Frequency of Communication with Friends and Family: Twice a week    Frequency of Social Gatherings with Friends and Family: Twice a week    Attends Religious Services: 1 to 4 times per year    Active Member of [employer] or Organizations: No    Attends Banker Meetings: Never    Marital Status: Married  Catering manager Violence: Not At Risk (06/16/2023)   Humiliation, Afraid, Rape, and Kick questionnaire    Fear of Current or Ex-Partner: No    Emotionally Abused: No    Physically Abused: No    Sexually Abused: No    Past Surgical History:  Procedure Laterality Date   APPENDECTOMY     CATARACT EXTRACTION, BILATERAL Bilateral    COLONOSCOPY  04/19/2022   EYE SURGERY     FRACTURE SURGERY     HYDROCELE EXCISION / REPAIR     THYROID SURGERY     TONSILLECTOMY     VASECTOMY      Family History  Problem Relation Age of Onset   Arthritis Mother    Breast cancer Mother    Stomach cancer Father 76       duodenal cancer   Heart attack Father    Cancer - Colon Neg Hx    Colon cancer Neg Hx    Rectal cancer Neg Hx    Esophageal cancer Neg Hx     No Known Allergies  Current Outpatient Medications on File Prior to Visit  Medication Sig Dispense Refill   levocetirizine (XYZAL) 5 MG tablet TAKE 1 TABLET BY MOUTH EVERY DAY IN THE EVENING 90 tablet 0    levothyroxine (SYNTHROID) 137 MCG tablet Take 1 tablet (137 mcg total) by mouth daily before breakfast. 90 tablet 0   losartan (COZAAR) 50 MG tablet TAKE 1 TABLET(50 MG) BY MOUTH DAILY 90 tablet 2   rosuvastatin (CRESTOR) 40 MG tablet Take 1 tablet (40 mg total) by mouth daily. 90 tablet 2   No current facility-administered medications on file prior to visit.    BP 130/78   Pulse 68   Resp 18   Ht 6\' 1"  (1.854 m)   Wt (!) 302 lb (137 kg)   SpO2  98%   BMI 39.84 kg/m           Objective:   Physical Exam  General Mental Status- Alert. General Appearance- Not in acute distress.   Skin General: Color- Normal Color. Moisture- Normal Moisture.  Neck Carotid Arteries- Normal color. Moisture- Normal Moisture. No carotid bruits. No JVD.  Chest and Lung Exam Auscultation: Breath Sounds:-Normal.  Cardiovascular Auscultation:Rythm- Regular. Murmurs & Other Heart Sounds:Auscultation of the heart reveals- No Murmurs.  Abdomen Inspection:-Inspeection Normal. Palpation/Percussion:Note:No mass. Palpation and Percussion of the abdomen reveal- Non Tender, Non Distended + BS, no rebound or guarding.   Neurologic Cranial Nerve exam:- CN III-XII intact(No nystagmus), symmetric smile. Strength:- 5/5 equal and symmetric strength both upper and lower extremities.       Assessment & Plan:   Assessment and Plan    Lumbar Spinal Stenosis Chronic lumbar back pain with moderate degenerative changes at L5-S1, worsening over time. Symptoms include pain after walking 500 steps, relieved by sitting, and numbness in the legs when standing for extended periods. Possible nerve impingement suspected. He has been unable to exercise due to  restrictions from thoracic aneurysm. Options include Medrol taper for relief or referral to sports medicine for further evaluation, which may include physical therapy or lumbar MRI. - Refer to sports medicine for evaluation and management - Consider physical  therapy - Consider lumbar MRI if necessary - Consider Medrol taper for symptomatic relief  Thoracic Aortic Aneurysm Thoracic aortic aneurysm measuring 4.5 to 4.8 cm, discovered after abnormal EKG and CT. Advised to avoid lifting over 30 pounds. Monitoring with CT every six months. He expresses concern about the condition and its implications on physical activity. Referral to cardiothoracic surgeon placed for further evaluation. - Refer to cardiothoracic surgeon for evaluation - Continue monitoring with CT scans every six months - Agree to avoid lifting over 30 pounds  Obesity Expresses concern about weight and its impact on back pain and potential future surgery. Interested in weight management options. Referral to Healthy Weight Loss Management program placed for weight management and coordination with cardiology. - Refer to Healthy Weight Loss Management program for weight management and coordination with cardiology  Hypertension Advised to monitor blood pressure at home. If readings trend over 140/90, consider increasing losartan dosage. Monitoring plan discussed to ensure blood pressure control. - Advise to monitor blood pressure at home twice consecutively, three times a week - Increase losartan to 100 mg if blood pressure trends over 140/90  Hypothyroidism Well-managed on current medication. Plan to check TSH and T4 levels in October. - Check TSH and T4 levels in October   Follow up in october or sooner if needed.        Esperanza Richters, PA-C

## 2023-11-07 NOTE — Patient Instructions (Signed)
 Lumbar Spinal Stenosis Chronic lumbar back pain with moderate degenerative changes at L5-S1, worsening over time. Symptoms include pain after walking 500 steps, relieved by sitting, and numbness in the legs when standing for extended periods. Possible nerve impingement suspected. He has been unable to exercise due to  restrictions from thoracic aneurysm. Options include Medrol taper for relief or referral to sports medicine for further evaluation, which may include physical therapy or lumbar MRI. - Refer to sports medicine for evaluation and management - Consider physical therapy - Consider lumbar MRI if necessary - Consider Medrol taper for symptomatic relief  Thoracic Aortic Aneurysm Thoracic aortic aneurysm measuring 4.5 to 4.8 cm, discovered after abnormal EKG and CT. Advised to avoid lifting over 30 pounds. Monitoring with CT every six months. He expresses concern about the condition and its implications on physical activity. Referral to cardiothoracic surgeon placed for further evaluation. - Refer to cardiothoracic surgeon for evaluation - Continue monitoring with CT scans every six months - Agree to avoid lifting over 30 pounds  Obesity Expresses concern about weight and its impact on back pain and potential future surgery. Interested in weight management options. Referral to Healthy Weight Loss Management program placed for weight management and coordination with cardiology. - Refer to Healthy Weight Loss Management program for weight management and coordination with cardiology  Hypertension Advised to monitor blood pressure at home. If readings trend over 140/90, consider increasing losartan dosage. Monitoring plan discussed to ensure blood pressure control. - Advise to monitor blood pressure at home twice consecutively, three times a week - Increase losartan to 100 mg if blood pressure trends over 140/90  Hypothyroidism Well-managed on current medication. Plan to check TSH and T4  levels in October. - Check TSH and T4 levels in October   Follow up in october or sooner if needed.

## 2023-11-08 ENCOUNTER — Other Ambulatory Visit: Payer: Self-pay | Admitting: Medical

## 2023-11-13 NOTE — Progress Notes (Unsigned)
   Rubin Payor, PhD, LAT, ATC acting as a scribe for Clementeen Graham, MD.  Jackson French is a 73 y.o. male who presents to [facility] today for LBP x ***. Pt locates pain to ***  Radiating pain: LE numbness/tingling: LE weakness: Aggravates: Treatments tried:  Dx imaging: 11/17/21 L-spine XR  Pertinent review of systems: ***  Relevant historical information: ***   Exam:  There were no vitals taken for this visit. General: Well Developed, well nourished, and in no acute distress.   MSK: ***    Lab and Radiology Results No results found for this or any previous visit (from the past 72 hours). No results found.     Assessment and Plan: 73 y.o. male with ***   PDMP not reviewed this encounter. No orders of the defined types were placed in this encounter.  No orders of the defined types were placed in this encounter.    Discussed warning signs or symptoms. Please see discharge instructions. Patient expresses understanding.   ***

## 2023-11-14 ENCOUNTER — Ambulatory Visit (INDEPENDENT_AMBULATORY_CARE_PROVIDER_SITE_OTHER): Admitting: Family Medicine

## 2023-11-14 ENCOUNTER — Encounter (INDEPENDENT_AMBULATORY_CARE_PROVIDER_SITE_OTHER): Payer: Self-pay

## 2023-11-14 ENCOUNTER — Ambulatory Visit (INDEPENDENT_AMBULATORY_CARE_PROVIDER_SITE_OTHER)

## 2023-11-14 VITALS — BP 126/82 | HR 84 | Ht 73.0 in | Wt 300.0 lb

## 2023-11-14 DIAGNOSIS — M5441 Lumbago with sciatica, right side: Secondary | ICD-10-CM

## 2023-11-14 DIAGNOSIS — M5442 Lumbago with sciatica, left side: Secondary | ICD-10-CM | POA: Diagnosis not present

## 2023-11-14 DIAGNOSIS — G8929 Other chronic pain: Secondary | ICD-10-CM

## 2023-11-14 DIAGNOSIS — M79605 Pain in left leg: Secondary | ICD-10-CM | POA: Diagnosis not present

## 2023-11-14 DIAGNOSIS — M5136 Other intervertebral disc degeneration, lumbar region with discogenic back pain only: Secondary | ICD-10-CM | POA: Diagnosis not present

## 2023-11-14 DIAGNOSIS — M47816 Spondylosis without myelopathy or radiculopathy, lumbar region: Secondary | ICD-10-CM | POA: Diagnosis not present

## 2023-11-14 DIAGNOSIS — M48062 Spinal stenosis, lumbar region with neurogenic claudication: Secondary | ICD-10-CM

## 2023-11-14 DIAGNOSIS — M48061 Spinal stenosis, lumbar region without neurogenic claudication: Secondary | ICD-10-CM | POA: Diagnosis not present

## 2023-11-14 DIAGNOSIS — M79604 Pain in right leg: Secondary | ICD-10-CM

## 2023-11-14 DIAGNOSIS — M5126 Other intervertebral disc displacement, lumbar region: Secondary | ICD-10-CM | POA: Diagnosis not present

## 2023-11-14 NOTE — Patient Instructions (Addendum)
 Thank you for coming in today.   Please get an Xray today before you leave   I've referred you to Physical Therapy.  Let us know if you don't hear from them in one week.   You have a Vascular Ultrasound scheduled for:  [facility] [address] [place], [place] [address] Phone: [phone]   Check back in early June

## 2023-11-17 ENCOUNTER — Encounter: Payer: Self-pay | Admitting: Family Medicine

## 2023-11-17 NOTE — Progress Notes (Signed)
 Low back x-ray shows some arthritis.  No broken bones are visible.

## 2023-12-05 ENCOUNTER — Encounter: Payer: Self-pay | Admitting: Physical Therapy

## 2023-12-05 ENCOUNTER — Ambulatory Visit: Attending: Family Medicine | Admitting: Physical Therapy

## 2023-12-05 ENCOUNTER — Other Ambulatory Visit: Payer: Self-pay

## 2023-12-05 DIAGNOSIS — M5442 Lumbago with sciatica, left side: Secondary | ICD-10-CM | POA: Diagnosis not present

## 2023-12-05 DIAGNOSIS — R262 Difficulty in walking, not elsewhere classified: Secondary | ICD-10-CM | POA: Diagnosis not present

## 2023-12-05 DIAGNOSIS — M5459 Other low back pain: Secondary | ICD-10-CM | POA: Diagnosis not present

## 2023-12-05 DIAGNOSIS — R29898 Other symptoms and signs involving the musculoskeletal system: Secondary | ICD-10-CM | POA: Diagnosis not present

## 2023-12-05 DIAGNOSIS — G8929 Other chronic pain: Secondary | ICD-10-CM | POA: Insufficient documentation

## 2023-12-05 DIAGNOSIS — M5441 Lumbago with sciatica, right side: Secondary | ICD-10-CM | POA: Diagnosis not present

## 2023-12-05 NOTE — Therapy (Signed)
 OUTPATIENT PHYSICAL THERAPY THORACOLUMBAR EVALUATION   Patient Name: Jackson French MRN: [id number] DOB:1951-02-27, 73 y.o., male Today's Date: 12/05/2023  END OF SESSION:  PT End of Session - 12/05/23 0940     Visit Number 1    Number of Visits 13    Date for PT Re-Evaluation 01/30/24    Authorization Type MCR and Mutual    Authorization Time Period 12/05/23 to 01/30/24    Progress Note Due on Visit 10    PT Start Time 0932    PT Stop Time 1013    PT Time Calculation (min) 41 min    Activity Tolerance Patient tolerated treatment well    Behavior During Therapy [facility] for tasks assessed/performed             Past Medical History:  Diagnosis Date   Allergy    Cataract removed IOLs ou   GERD (gastroesophageal reflux disease) treated by gastro   Gout    Hyperlipidemia    Hypertension    Sleep apnea    no CPAP   Thyroid  disease    Past Surgical History:  Procedure Laterality Date   APPENDECTOMY     CATARACT EXTRACTION, BILATERAL Bilateral    COLONOSCOPY  04/19/2022   EYE SURGERY     FRACTURE SURGERY     HYDROCELE EXCISION / REPAIR     THYROID  SURGERY     TONSILLECTOMY     VASECTOMY     Patient Active Problem List   Diagnosis Date Noted   Elevated PSA 01/18/2018   Chronic constipation 03/07/2017   Dyslipidemia 03/07/2017   Hypothyroidism 03/07/2017   History of gout 03/07/2017   OSA (obstructive sleep apnea) 03/07/2017   Essential hypertension 05/17/2016   Severe obesity (BMI 35.0-35.9 with comorbidity) (HCC) 05/17/2016   Umbilical hernia without obstruction and without gangrene 12/23/2014    PCP: Francine Iron   REFERRING PROVIDER: Syliva Even, MD  REFERRING DIAG: M54.42,M54.41,G89.29 (ICD-10-CM) - Chronic bilateral low back pain with bilateral sciatica  Rationale for Evaluation and Treatment: Rehabilitation  THERAPY DIAG:  Other low back pain  Other symptoms and signs involving the musculoskeletal system  Difficulty in walking, not  elsewhere classified  ONSET DATE: aneurysm dx-ed August 2024,   SUBJECTIVE:                                                                                                                                                                                           SUBJECTIVE STATEMENT:  I've been having some issues with my back but also have an aneurysm that I have some appts for in May. Seeing vascular surgeon at the  end of May. I had been trying to work out- walking and weights- and then got the news about the aneurysm and stopped exercising, gained weight. Back feels muscular to me. I do have a neuralgia in my hip when I sleep, it gets numb in the upper part of my thigh and have gotten some snapping/popping in my left hip no pain. My big concern is the weight gain, I feel like I don't know what to do in terms of exercising with the aneurysm.   PERTINENT HISTORY:  See above   PAIN:  Are you having pain? No 0/10 now, can get to 8/10 at worst (doesn't take a lot of walking or standing to bring it on)  PRECAUTIONS: Other: known aneurysm with possible upcoming surgical intervention (TBD)- avoid high inter-abdominal pressures/valgus   RED FLAGS: None   WEIGHT BEARING RESTRICTIONS: No  FALLS:  Has patient fallen in last 6 months? No, no FOF   LIVING ENVIRONMENT: Lives with: lives with their spouse Lives in: House/apartment   OCCUPATION: retired- optometrist   PLOF: Independent, Independent with basic ADLs, Independent with gait, and Independent with transfers  PATIENT GOALS: figure out how to exercise safely given aneurysm, lose weight, address back pain and be able to walk/exercise more  NEXT MD VISIT: referring June 5th  OBJECTIVE:  Note: Objective measures were completed at Evaluation unless otherwise noted.  DIAGNOSTIC FINDINGS:   IMPRESSION: 1. Stable aneurysmal disease of the ascending thoracic aorta measuring approximately 4.6-4.7 cm in greatest diameter.  Ascending thoracic aortic aneurysm. Recommend semi-annual imaging followup by CTA or MRA and referral to cardiothoracic surgery if not already obtained. This recommendation follows 2010 ACCF/AHA/AATS/ACR/ASA/SCA/SCAI/SIR/STS/SVM Guidelines for the Diagnosis and Management of Patients With Thoracic Aortic Disease. Circulation. 2010; 121: G956-O130. Aortic aneurysm NOS (ICD10-I71.9) 2. Stable dilatation of the aortic root measuring approximately 4.5-4.7 cm at the level of the sinuses of Valsalva. 3. Stable mild calcified coronary artery plaque.   Aortic aneurysm NOS (ICD10-I71.9).  CLINICAL DATA:  low back pain   EXAM: LUMBAR SPINE - 3 VIEW   COMPARISON:  11/17/2021.   FINDINGS: No fracture, dislocation or subluxation. Grade 1 L5 retrolisthesis. No osteolytic or osteoblastic changes.   Degenerative disc disease noted with disc space narrowing and marginal osteophytes at T12-L1, L1-L2 and L5-S1. Facet joint degenerative changes with sclerosis and osteophytes L4-5 and L5-S1.   IMPRESSION: Degenerative changes. No acute osseous abnormalities.  PATIENT SURVEYS:   Patient-Specific Activity Scoring Scheme  "0" represents "unable to perform." "10" represents "able to perform at prior level. 0 1 2 3 4 5 6 7 8 9  10 (Date and Score)   Activity Eval     1. Standing for extended periods  6     2. Walking for extended periods   3    3. Exercising in context of aneurysm  2   4.    5.    Score 3.7    Total score = sum of the activity scores/number of activities Minimum detectable change (90%CI) for average score = 2 points Minimum detectable change (90%CI) for single activity score = 3 points      COGNITION: Overall cognitive status: Within functional limits for tasks assessed     SENSATION: None now, gets some numbness into L thigh when laying on L side (after about 30 minutes)- sleeping on stomach resolves this   MUSCLE LENGTH:  Hip flexors severe limitation B per  functional observation  HS severe limitation B Piriformis severe limitation B  POSTURE: rounded shoulders, forward head, decreased lumbar lordosis, and increased thoracic kyphosis  PALPATION: Lumbar/thoracic paraspinals very tight but not tender   LUMBAR ROM:   AROM eval  Flexion WNL but very tight HS   Extension Severe limitation REIS no change   Right lateral flexion Mod limitation   Left lateral flexion Mod limitation   Right rotation Mod limitation   Left rotation Mod limitation    (Blank rows = not tested)    LOWER EXTREMITY MMT:    MMT Right eval Left eval  Hip flexion 5 5  Hip extension    Hip abduction 5 5  Hip adduction    Hip internal rotation    Hip external rotation    Knee flexion 5 5  Knee extension 5 5  Ankle dorsiflexion    Ankle plantarflexion    Ankle inversion    Ankle eversion      LUMBAR SPECIAL TESTS:  Straight leg raise test: Negative    TREATMENT DATE:   12/05/23  Exam, POC, HEP                                                                                                                                    PATIENT EDUCATION:  Education details: exam findings, POC, HEP, general exercise precautions with aneurysm, recommended reclining bike or Nustep at the gym as this may be more tolerable than walking program  Person educated: Patient Education method: Explanation and Demonstration Education comprehension: verbalized understanding, returned demonstration, and needs further education  HOME EXERCISE PROGRAM:  Access Code: [email or web address] URL: [email or web address] Date: 12/05/2023 Prepared by: Terrel Ferries  Exercises - Supine Transversus Abdominis Bracing - Hands on Stomach  - 3 x daily - 7 x weekly - 1 sets - 10 reps - 1 second  hold - Hooklying Hamstring Stretch with Strap  - 2 x daily - 7 x weekly - 1 sets - 3 reps - 30 seconds  hold - Supine Figure 4 Piriformis Stretch  - 2 x daily - 7 x weekly - 1 sets - 3  reps - 30 seconds  hold - Modified Thomas Stretch  - 2 x daily - 7 x weekly - 1 sets - 3 reps - 30 seconds  hold    ASSESSMENT:  CLINICAL IMPRESSION: Patient is a 73 y.o. M who was seen today for physical therapy evaluation and treatment for M54.42,M54.41,G89.29 (ICD-10-CM) - Chronic bilateral low back pain with bilateral sciatica. Objectives as above, he does have a significant aneurysm with possible upcoming surgery (awaiting formal vascular consult), we will need to be careful with this until medical team is able to work it up a bit more. Does have quite a bit MSK findings that we can address in PT- will progress as appropriate.   OBJECTIVE IMPAIRMENTS: decreased activity tolerance, decreased mobility, difficulty walking, decreased ROM, decreased strength, hypomobility, increased fascial restrictions, increased muscle spasms, impaired flexibility, improper body mechanics, postural  dysfunction, and pain.   ACTIVITY LIMITATIONS: carrying, lifting, standing, and locomotion level  PARTICIPATION LIMITATIONS: driving, shopping, community activity, and yard work  PERSONAL FACTORS: Age, Fitness, and Time since onset of injury/illness/exacerbation are also affecting patient's functional outcome.   REHAB POTENTIAL: Fair multiple medical co-morbidities influencing health   CLINICAL DECISION MAKING: Evolving/moderate complexity  EVALUATION COMPLEXITY: Moderate   GOALS: Goals reviewed with patient? No  SHORT TERM GOALS: Target date: 12/26/2023    Will be compliant with appropriate progressive HEP  Baseline: Goal status: INITIAL  2.  Will demonstrate good biomechanics for bed mobility and floor to waist transfers  Baseline:  Goal status: INITIAL  3.  Will be able to verbalize basic precautions to avoid damaging known aneurysm and principles of progressions on reclining bike  Baseline:  Goal status: INITIAL   LONG TERM GOALS: Target date: 01/16/2024    Pain to be no more than 4/10 at  worst  Baseline:  Goal status: INITIAL  2.  Will be able to stand for at least 45 minutes without increase in back pain or radicular sx  Baseline:  Goal status: INITIAL  3.  Will be able to walk for at least 45 minutes without increase in back pain or radicular symptoms  Baseline:  Goal status: INITIAL  4.  Lumbar and BLE flexibility impairments to be no more than 25% limited  Baseline:  Goal status: INITIAL  5.  Will tolerate regular gym based exercise program safe for known aneurysm without increase in pain  Baseline:  Goal status: INITIAL  6.  PSFS score to have improved by 2 points  Baseline:  Goal status: INITIAL  PLAN:  PT FREQUENCY: 2x/week  PT DURATION: 6 weeks  PLANNED INTERVENTIONS: 97750- Physical Performance Testing, 97110-Therapeutic exercises, 97530- Therapeutic activity, [id number]- Neuromuscular re-education, 97535- Self Care, [id number]- Manual therapy, [id number]- Aquatic Therapy, Taping, and Dry Needling.  PLAN FOR NEXT SESSION: caution with known aneurysm- avoid valgus maneuver and heavy resistance training for now- careful core strengthening, biomechanics, LE and back mobility/flexibility, postural training   Terrel Ferries, PT, DPT 12/05/23 10:30 AM

## 2023-12-12 ENCOUNTER — Ambulatory Visit (HOSPITAL_COMMUNITY)
Admission: RE | Admit: 2023-12-12 | Discharge: 2023-12-12 | Disposition: A | Discharge status: Home / Self Care | Source: Ambulatory Visit | Attending: Family Medicine | Admitting: Family Medicine

## 2023-12-12 DIAGNOSIS — M48062 Spinal stenosis, lumbar region with neurogenic claudication: Secondary | ICD-10-CM | POA: Insufficient documentation

## 2023-12-13 NOTE — Progress Notes (Signed)
 HPI: Follow-up coronary artery disease, hypertension and bradycardia.  Coronary CTA August 2024 showed calcium  score 29.4 which was 24th percentile, total plaque volume 106 and mild disease in the mid LAD and minimal disease in the right coronary artery and OM1; ascending aorta 48 x 46 mm.  ABIs August 2024 normal.  CTA February 2025 showed ascending thoracic aortic aneurysm at 4.6 x 4.7 cm, stable dilatation of the aortic root at 4.5 x 4.7 cm.  Since last seen the patient denies any dyspnea on exertion, orthopnea, PND, pedal edema, palpitations, syncope or chest pain.   Current Outpatient Medications  Medication Sig Dispense Refill   levocetirizine (XYZAL ) 5 MG tablet TAKE 1 TABLET BY MOUTH EVERY DAY IN THE EVENING 90 tablet 0   levothyroxine  (SYNTHROID ) 137 MCG tablet Take 1 tablet (137 mcg total) by mouth daily before breakfast. 90 tablet 0   losartan  (COZAAR ) 50 MG tablet TAKE 1 TABLET BY MOUTH EVERY DAY 90 tablet 2   rosuvastatin  (CRESTOR ) 40 MG tablet Take 1 tablet (40 mg total) by mouth daily. 90 tablet 2   No current facility-administered medications for this visit.     Past Medical History:  Diagnosis Date   Allergy    Cataract removed IOLs ou   GERD (gastroesophageal reflux disease) treated by gastro   Gout    Hyperlipidemia    Hypertension    Sleep apnea    no CPAP   Thyroid  disease     Past Surgical History:  Procedure Laterality Date   APPENDECTOMY     CATARACT EXTRACTION, BILATERAL Bilateral    COLONOSCOPY  04/19/2022   EYE SURGERY     FRACTURE SURGERY     HYDROCELE EXCISION / REPAIR     THYROID  SURGERY     TONSILLECTOMY     VASECTOMY      Social History   Socioeconomic History   Marital status: Married    Spouse name: Not on file   Number of children: 3   Years of education: Not on file   Highest education level: Professional school degree (e.g., MD, DDS, DVM, JD)  Occupational History   Not on file  Tobacco Use   Smoking status: Never    Smokeless tobacco: Never  Vaping Use   Vaping status: Never Used  Substance and Sexual Activity   Alcohol use: Yes    Alcohol/week: 2.0 standard drinks of alcohol    Types: 2 Glasses of wine per week    Comment: rarely   Drug use: Never   Sexual activity: Yes  Other Topics Concern   Not on file  Social History Narrative   Not on file   Social Drivers of Health   Financial Resource Strain: Low Risk  (06/12/2023)   Overall Financial Resource Strain (CARDIA)    Difficulty of Paying Living Expenses: Not very hard  Food Insecurity: No Food Insecurity (11/06/2023)   Hunger Vital Sign    Worried About Running Out of Food in the Last Year: Never true    Ran Out of Food in the Last Year: Never true  Transportation Needs: No Transportation Needs (11/06/2023)   PRAPARE - Administrator, Civil Service (Medical): No    Lack of Transportation (Non-Medical): No  Physical Activity: Insufficiently Active (11/06/2023)   Exercise Vital Sign    Days of Exercise per Week: 2 days    Minutes of Exercise per Session: 20 min  Stress: No Stress Concern Present (11/06/2023)   [employer]  of Occupational Health - Occupational Stress Questionnaire    Feeling of Stress : Only a little  Social Connections: Moderately Integrated (11/06/2023)   Social Connection and Isolation Panel [NHANES]    Frequency of Communication with Friends and Family: Twice a week    Frequency of Social Gatherings with Friends and Family: Twice a week    Attends Religious Services: 1 to 4 times per year    Active Member of [employer] or Organizations: No    Attends Banker Meetings: Never    Marital Status: Married  Catering manager Violence: Not At Risk (06/16/2023)   Humiliation, Afraid, Rape, and Kick questionnaire    Fear of Current or Ex-Partner: No    Emotionally Abused: No    Physically Abused: No    Sexually Abused: No    Family History  Problem Relation Age of Onset   Arthritis Mother     Breast cancer Mother    Stomach cancer Father 42       duodenal cancer   Heart attack Father    Cancer - Colon Neg Hx    Colon cancer Neg Hx    Rectal cancer Neg Hx    Esophageal cancer Neg Hx     ROS: no fevers or chills, productive cough, hemoptysis, dysphasia, odynophagia, melena, hematochezia, dysuria, hematuria, rash, seizure activity, orthopnea, PND, pedal edema, claudication. Remaining systems are negative.  Physical Exam: Well-developed well-nourished in no acute distress.  Skin is warm and dry.  HEENT is normal.  Neck is supple.  Chest is clear to auscultation with normal expansion.  Cardiovascular exam is regular rate and rhythm.  Abdominal exam nontender or distended. No masses palpated. Positive bruit Extremities show no edema. neuro grossly intact  EKG Interpretation Date/Time:  Wednesday Dec 27 2023 11:22:29 EDT Ventricular Rate:  76 PR Interval:  166 QRS Duration:  86 QT Interval:  358 QTC Calculation: 402 R Axis:   -32  Text Interpretation: Normal sinus rhythm Left axis deviation Nonspecific T wave abnormality Confirmed by Alexandria Angel ([id number]) on 12/27/2023 11:26:08 AM    A/P  1 coronary artery disease-noted on previous CTA.  Mild.  Continue statin.  2 thoracic aortic aneurysm-plan follow-up CTA August 2025.  3 hypertension-patient's blood pressure is elevated.  Increase losartan  to 100 mg daily and follow.  Check potassium and renal function in 1 week.  4 hyperlipidemia-continue statin.  5 history of bradycardia-patient has not had syncope and I have no strips for verification.  Will continue to follow.  6 bruit-will arrange abdominal ultrasound to exclude aneurysm particularly in light of thoracic aortic aneurysm.  Alexandria Angel, MD

## 2023-12-13 NOTE — Progress Notes (Signed)
 No evidence of restricted blood flow to the legs.  This is good news.

## 2023-12-14 LAB — VAS US ABI WITH/WO TBI
Left ABI: 1.27
Right ABI: 1.21

## 2023-12-21 ENCOUNTER — Encounter: Payer: Self-pay | Admitting: Cardiology

## 2023-12-21 MED ORDER — ROSUVASTATIN CALCIUM 40 MG PO TABS: 40.0000 mg | ORAL_TABLET | Freq: Every day | ORAL | 2 refills | Status: DC

## 2023-12-22 ENCOUNTER — Ambulatory Visit: Attending: Family Medicine | Admitting: Physical Therapy

## 2023-12-22 DIAGNOSIS — R262 Difficulty in walking, not elsewhere classified: Secondary | ICD-10-CM | POA: Insufficient documentation

## 2023-12-22 DIAGNOSIS — M5459 Other low back pain: Secondary | ICD-10-CM | POA: Diagnosis not present

## 2023-12-22 DIAGNOSIS — R29898 Other symptoms and signs involving the musculoskeletal system: Secondary | ICD-10-CM | POA: Insufficient documentation

## 2023-12-22 NOTE — Therapy (Signed)
 OUTPATIENT PHYSICAL THERAPY THORACOLUMBAR    Patient Name: Jackson French MRN: [id number] DOB:1951/03/19, 73 y.o., male Today's Date: 12/22/2023  END OF SESSION:  PT End of Session - 12/22/23 0914     Visit Number 2    Number of Visits 13    Date for PT Re-Evaluation 01/30/24    Authorization Type MCR and Mutual    Authorization Time Period 12/05/23 to 01/30/24    PT Start Time 0915    PT Stop Time 1000    PT Time Calculation (min) 45 min             Past Medical History:  Diagnosis Date   Allergy    Cataract removed IOLs ou   GERD (gastroesophageal reflux disease) treated by gastro   Gout    Hyperlipidemia    Hypertension    Sleep apnea    no CPAP   Thyroid  disease    Past Surgical History:  Procedure Laterality Date   APPENDECTOMY     CATARACT EXTRACTION, BILATERAL Bilateral    COLONOSCOPY  04/19/2022   EYE SURGERY     FRACTURE SURGERY     HYDROCELE EXCISION / REPAIR     THYROID  SURGERY     TONSILLECTOMY     VASECTOMY     Patient Active Problem List   Diagnosis Date Noted   Elevated PSA 01/18/2018   Chronic constipation 03/07/2017   Dyslipidemia 03/07/2017   Hypothyroidism 03/07/2017   History of gout 03/07/2017   OSA (obstructive sleep apnea) 03/07/2017   Essential hypertension 05/17/2016   Severe obesity (BMI 35.0-35.9 with comorbidity) (HCC) 05/17/2016   Umbilical hernia without obstruction and without gangrene 12/23/2014    PCP: Francine Iron   REFERRING PROVIDER: Syliva Even, MD  REFERRING DIAG: M54.42,M54.41,G89.29 (ICD-10-CM) - Chronic bilateral low back pain with bilateral sciatica  Rationale for Evaluation and Treatment: Rehabilitation  THERAPY DIAG:  Other low back pain  Other symptoms and signs involving the musculoskeletal system  Difficulty in walking, not elsewhere classified  ONSET DATE: aneurysm dx-ed August 2024,   SUBJECTIVE:                                                                                                                                                                                            SUBJECTIVE STATEMENT: Doing good, ex are helping    I've been having some issues with my back but also have an aneurysm that I have some appts for in May. Seeing vascular surgeon at the end of May. I had been trying to work out- walking and weights- and then got the news about the aneurysm  and stopped exercising, gained weight. Back feels muscular to me. I do have a neuralgia in my hip when I sleep, it gets numb in the upper part of my thigh and have gotten some snapping/popping in my left hip no pain. My big concern is the weight gain, I feel like I don't know what to do in terms of exercising with the aneurysm.   PERTINENT HISTORY:  See above   PAIN:  Are you having pain? No 0/10 now, can get to 8/10 at worst (doesn't take a lot of walking or standing to bring it on)  PRECAUTIONS: Other: known aneurysm with possible upcoming surgical intervention (TBD)- avoid high inter-abdominal pressures/valgus   RED FLAGS: None   WEIGHT BEARING RESTRICTIONS: No  FALLS:  Has patient fallen in last 6 months? No, no FOF   LIVING ENVIRONMENT: Lives with: lives with their spouse Lives in: House/apartment   OCCUPATION: retired- optometrist   PLOF: Independent, Independent with basic ADLs, Independent with gait, and Independent with transfers  PATIENT GOALS: figure out how to exercise safely given aneurysm, lose weight, address back pain and be able to walk/exercise more  NEXT MD VISIT: referring June 5th  OBJECTIVE:  Note: Objective measures were completed at Evaluation unless otherwise noted.  DIAGNOSTIC FINDINGS:   IMPRESSION: 1. Stable aneurysmal disease of the ascending thoracic aorta measuring approximately 4.6-4.7 cm in greatest diameter. Ascending thoracic aortic aneurysm. Recommend semi-annual imaging followup by CTA or MRA and referral to cardiothoracic surgery if not  already obtained. This recommendation follows 2010 ACCF/AHA/AATS/ACR/ASA/SCA/SCAI/SIR/STS/SVM Guidelines for the Diagnosis and Management of Patients With Thoracic Aortic Disease. Circulation. 2010; 121: Z366-Y403. Aortic aneurysm NOS (ICD10-I71.9) 2. Stable dilatation of the aortic root measuring approximately 4.5-4.7 cm at the level of the sinuses of Valsalva. 3. Stable mild calcified coronary artery plaque.   Aortic aneurysm NOS (ICD10-I71.9).  CLINICAL DATA:  low back pain   EXAM: LUMBAR SPINE - 3 VIEW   COMPARISON:  11/17/2021.   FINDINGS: No fracture, dislocation or subluxation. Grade 1 L5 retrolisthesis. No osteolytic or osteoblastic changes.   Degenerative disc disease noted with disc space narrowing and marginal osteophytes at T12-L1, L1-L2 and L5-S1. Facet joint degenerative changes with sclerosis and osteophytes L4-5 and L5-S1.   IMPRESSION: Degenerative changes. No acute osseous abnormalities.  PATIENT SURVEYS:   Patient-Specific Activity Scoring Scheme  "0" represents "unable to perform." "10" represents "able to perform at prior level. 0 1 2 3 4 5 6 7 8 9  10 (Date and Score)   Activity Eval     1. Standing for extended periods  6     2. Walking for extended periods   3    3. Exercising in context of aneurysm  2   4.    5.    Score 3.7    Total score = sum of the activity scores/number of activities Minimum detectable change (90%CI) for average score = 2 points Minimum detectable change (90%CI) for single activity score = 3 points      COGNITION: Overall cognitive status: Within functional limits for tasks assessed     SENSATION: None now, gets some numbness into L thigh when laying on L side (after about 30 minutes)- sleeping on stomach resolves this   MUSCLE LENGTH:  Hip flexors severe limitation B per functional observation  HS severe limitation B Piriformis severe limitation B   POSTURE: rounded shoulders, forward head, decreased  lumbar lordosis, and increased thoracic kyphosis  PALPATION: Lumbar/thoracic paraspinals very tight but not tender  LUMBAR ROM:   AROM eval  Flexion WNL but very tight HS   Extension Severe limitation REIS no change   Right lateral flexion Mod limitation   Left lateral flexion Mod limitation   Right rotation Mod limitation   Left rotation Mod limitation    (Blank rows = not tested)    LOWER EXTREMITY MMT:    MMT Right eval Left eval  Hip flexion 5 5  Hip extension    Hip abduction 5 5  Hip adduction    Hip internal rotation    Hip external rotation    Knee flexion 5 5  Knee extension 5 5  Ankle dorsiflexion    Ankle plantarflexion    Ankle inversion    Ankle eversion      LUMBAR SPECIAL TESTS:  Straight leg raise test: Negative    TREATMENT DATE:   12/22/23 Nustep L 4 10# cable pulleys shld ext and rows 2 sets 10 20# resisted gait 4 way 5 x each Feet on ball at knees bridge 10x,KTC and obl 10 each.iso abdominals 10 x ADD ball squeeze 10 x then 10x with low bridge Clams DL 10 x then SL 10 x green tband Hip flexion supine green tband 20 x PROM and stretching LE and trunk Standing hip ext and abd 10 x BIL red tband Wt ball trunk ext at wall 10 x    12/05/23  Exam, POC, HEP                                                                                                                                    PATIENT EDUCATION:  Education details: exam findings, POC, HEP, general exercise precautions with aneurysm, recommended reclining bike or Nustep at the gym as this may be more tolerable than walking program  Person educated: Patient Education method: Explanation and Demonstration Education comprehension: verbalized understanding, returned demonstration, and needs further education  HOME EXERCISE PROGRAM:  Access Code: [email or web address] URL: [email or web address] Date: 12/05/2023 Prepared by: Terrel Ferries  Exercises - Supine  Transversus Abdominis Bracing - Hands on Stomach  - 3 x daily - 7 x weekly - 1 sets - 10 reps - 1 second  hold - Hooklying Hamstring Stretch with Strap  - 2 x daily - 7 x weekly - 1 sets - 3 reps - 30 seconds  hold - Supine Figure 4 Piriformis Stretch  - 2 x daily - 7 x weekly - 1 sets - 3 reps - 30 seconds  hold - Modified Thomas Stretch  - 2 x daily - 7 x weekly - 1 sets - 3 reps - 30 seconds  hold    ASSESSMENT:  CLINICAL IMPRESSION:  Pt arrived for 1st visit after eval so we initiated core and postural stab ex. Pt needed cuing esp for poor posture with rounded head and shlds.cuing to  engage core but not hold breathe.assessed goals but limited d/t only 1st  visit since eval.   Patient is a 73 y.o. M who was seen today for physical therapy evaluation and treatment for M54.42,M54.41,G89.29 (ICD-10-CM) - Chronic bilateral low back pain with bilateral sciatica. Objectives as above, he does have a significant aneurysm with possible upcoming surgery (awaiting formal vascular consult), we will need to be careful with this until medical team is able to work it up a bit more. Does have quite a bit MSK findings that we can address in PT- will progress as appropriate.   OBJECTIVE IMPAIRMENTS: decreased activity tolerance, decreased mobility, difficulty walking, decreased ROM, decreased strength, hypomobility, increased fascial restrictions, increased muscle spasms, impaired flexibility, improper body mechanics, postural dysfunction, and pain.   ACTIVITY LIMITATIONS: carrying, lifting, standing, and locomotion level  PARTICIPATION LIMITATIONS: driving, shopping, community activity, and yard work  PERSONAL FACTORS: Age, Fitness, and Time since onset of injury/illness/exacerbation are also affecting patient's functional outcome.   REHAB POTENTIAL: Fair multiple medical co-morbidities influencing health   CLINICAL DECISION MAKING: Evolving/moderate complexity  EVALUATION COMPLEXITY:  Moderate   GOALS: Goals reviewed with patient? No  SHORT TERM GOALS: Target date: 12/26/2023    Will be compliant with appropriate progressive HEP  Baseline: Goal status: progressing 12/22/23  2.  Will demonstrate good biomechanics for bed mobility and floor to waist transfers  Baseline:  Goal status: INITIAL  3.  Will be able to verbalize basic precautions to avoid damaging known aneurysm and principles of progressions on reclining bike  Baseline:  Goal status: INITIAL   LONG TERM GOALS: Target date: 01/16/2024    Pain to be no more than 4/10 at worst  Baseline:  Goal status: INITIAL  2.  Will be able to stand for at least 45 minutes without increase in back pain or radicular sx  Baseline:  Goal status: INITIAL  3.  Will be able to walk for at least 45 minutes without increase in back pain or radicular symptoms  Baseline:  Goal status: INITIAL  4.  Lumbar and BLE flexibility impairments to be no more than 25% limited  Baseline:  Goal status: INITIAL  5.  Will tolerate regular gym based exercise program safe for known aneurysm without increase in pain  Baseline:  Goal status: INITIAL  6.  PSFS score to have improved by 2 points  Baseline:  Goal status: INITIAL  PLAN:  PT FREQUENCY: 2x/week  PT DURATION: 6 weeks  PLANNED INTERVENTIONS: 97750- Physical Performance Testing, 97110-Therapeutic exercises, 97530- Therapeutic activity, [id number]- Neuromuscular re-education, 97535- Self Care, 62130- Manual therapy, [id number]- Aquatic Therapy, Taping, and Dry Needling.  PLAN FOR NEXT SESSION: caution with known aneurysm- avoid valgus maneuver and heavy resistance training for now- careful core strengthening, biomechanics, LE and back mobility/flexibility, postural training    Patient Details  Name: Nicholi Carrick MRN: [id number] Date of Birth: 11/09/1950 Referring Provider:  Syliva Even, MD  Encounter Date: 12/22/2023   Aquilla Bayley, PTA 12/22/2023, 9:15 AM  [facility] [address] [facility] [place], [place], [address] Phone: [phone]   Fax:  [phone]

## 2023-12-26 ENCOUNTER — Ambulatory Visit: Admitting: Physical Therapy

## 2023-12-26 DIAGNOSIS — R262 Difficulty in walking, not elsewhere classified: Secondary | ICD-10-CM | POA: Diagnosis not present

## 2023-12-26 DIAGNOSIS — M5459 Other low back pain: Secondary | ICD-10-CM

## 2023-12-26 DIAGNOSIS — R29898 Other symptoms and signs involving the musculoskeletal system: Secondary | ICD-10-CM | POA: Diagnosis not present

## 2023-12-26 NOTE — Therapy (Signed)
 OUTPATIENT PHYSICAL THERAPY THORACOLUMBAR    Patient Name: Jackson French MRN: [id number] DOB:[birth year], 73 y.o., male Today's Date: 12/26/2023  END OF SESSION:  PT End of Session - 12/26/23 0836     Visit Number 3    Number of Visits 13    Date for PT Re-Evaluation 01/30/24    Authorization Type MCR and Mutual    Authorization Time Period 12/05/23 to 01/30/24    PT Start Time 0840    PT Stop Time 0925    PT Time Calculation (min) 45 min             Past Medical History:  Diagnosis Date   Allergy    Cataract removed IOLs ou   GERD (gastroesophageal reflux disease) treated by gastro   Gout    Hyperlipidemia    Hypertension    Sleep apnea    no CPAP   Thyroid  disease    Past Surgical History:  Procedure Laterality Date   APPENDECTOMY     CATARACT EXTRACTION, BILATERAL Bilateral    COLONOSCOPY  04/19/2022   EYE SURGERY     FRACTURE SURGERY     HYDROCELE EXCISION / REPAIR     THYROID  SURGERY     TONSILLECTOMY     VASECTOMY     Patient Active Problem List   Diagnosis Date Noted   Elevated PSA 01/18/2018   Chronic constipation 03/07/2017   Dyslipidemia 03/07/2017   Hypothyroidism 03/07/2017   History of gout 03/07/2017   OSA (obstructive sleep apnea) 03/07/2017   Essential hypertension 05/17/2016   Severe obesity (BMI 35.0-35.9 with comorbidity) (HCC) 05/17/2016   Umbilical hernia without obstruction and without gangrene 12/23/2014    PCP: Francine Iron   REFERRING PROVIDER: Syliva Even, MD  REFERRING DIAG: M54.42,M54.41,G89.29 (ICD-10-CM) - Chronic bilateral low back pain with bilateral sciatica  Rationale for Evaluation and Treatment: Rehabilitation  THERAPY DIAG:  Other low back pain  Other symptoms and signs involving the musculoskeletal system  Difficulty in walking, not elsewhere classified  ONSET DATE: aneurysm dx-ed August 2024,   SUBJECTIVE:                                                                                                                                                                                            SUBJECTIVE STATEMENT: Okay after last session. Have not made it to gym yet    I've been having some issues with my back but also have an aneurysm that I have some appts for in May. Seeing vascular surgeon at the end of May. I had been trying to work out- walking and weights- and then  got the news about the aneurysm and stopped exercising, gained weight. Back feels muscular to me. I do have a neuralgia in my hip when I sleep, it gets numb in the upper part of my thigh and have gotten some snapping/popping in my left hip no pain. My big concern is the weight gain, I feel like I don't know what to do in terms of exercising with the aneurysm.   PERTINENT HISTORY:  See above   PAIN:  Are you having pain? No 0/10 now, can get to 8/10 at worst (doesn't take a lot of walking or standing to bring it on)  PRECAUTIONS: Other: known aneurysm with possible upcoming surgical intervention (TBD)- avoid high inter-abdominal pressures/valgus   RED FLAGS: None   WEIGHT BEARING RESTRICTIONS: No  FALLS:  Has patient fallen in last 6 months? No, no FOF   LIVING ENVIRONMENT: Lives with: lives with their spouse Lives in: House/apartment   OCCUPATION: retired- optometrist   PLOF: Independent, Independent with basic ADLs, Independent with gait, and Independent with transfers  PATIENT GOALS: figure out how to exercise safely given aneurysm, lose weight, address back pain and be able to walk/exercise more  NEXT MD VISIT: referring June 5th  OBJECTIVE:  Note: Objective measures were completed at Evaluation unless otherwise noted.  DIAGNOSTIC FINDINGS:   IMPRESSION: 1. Stable aneurysmal disease of the ascending thoracic aorta measuring approximately 4.6-4.7 cm in greatest diameter. Ascending thoracic aortic aneurysm. Recommend semi-annual imaging followup by CTA or MRA and referral to cardiothoracic  surgery if not already obtained. This recommendation follows 2010 ACCF/AHA/AATS/ACR/ASA/SCA/SCAI/SIR/STS/SVM Guidelines for the Diagnosis and Management of Patients With Thoracic Aortic Disease. Circulation. 2010; 121: Z610-R604. Aortic aneurysm NOS (ICD10-I71.9) 2. Stable dilatation of the aortic root measuring approximately 4.5-4.7 cm at the level of the sinuses of Valsalva. 3. Stable mild calcified coronary artery plaque.   Aortic aneurysm NOS (ICD10-I71.9).  CLINICAL DATA:  low back pain   EXAM: LUMBAR SPINE - 3 VIEW   COMPARISON:  11/17/2021.   FINDINGS: No fracture, dislocation or subluxation. Grade 1 L5 retrolisthesis. No osteolytic or osteoblastic changes.   Degenerative disc disease noted with disc space narrowing and marginal osteophytes at T12-L1, L1-L2 and L5-S1. Facet joint degenerative changes with sclerosis and osteophytes L4-5 and L5-S1.   IMPRESSION: Degenerative changes. No acute osseous abnormalities.  PATIENT SURVEYS:   Patient-Specific Activity Scoring Scheme  "0" represents "unable to perform." "10" represents "able to perform at prior level. 0 1 2 3 4 5 6 7 8 9  10 (Date and Score)   Activity Eval     1. Standing for extended periods  6     2. Walking for extended periods   3    3. Exercising in context of aneurysm  2   4.    5.    Score 3.7    Total score = sum of the activity scores/number of activities Minimum detectable change (90%CI) for average score = 2 points Minimum detectable change (90%CI) for single activity score = 3 points      COGNITION: Overall cognitive status: Within functional limits for tasks assessed     SENSATION: None now, gets some numbness into L thigh when laying on L side (after about 30 minutes)- sleeping on stomach resolves this   MUSCLE LENGTH:  Hip flexors severe limitation B per functional observation  HS severe limitation B Piriformis severe limitation B   POSTURE: rounded shoulders, forward head,  decreased lumbar lordosis, and increased thoracic kyphosis  PALPATION: Lumbar/thoracic  paraspinals very tight but not tender   LUMBAR ROM:   AROM eval  Flexion WNL but very tight HS   Extension Severe limitation REIS no change   Right lateral flexion Mod limitation   Left lateral flexion Mod limitation   Right rotation Mod limitation   Left rotation Mod limitation    (Blank rows = not tested)    LOWER EXTREMITY MMT:    MMT Right eval Left eval  Hip flexion 5 5  Hip extension    Hip abduction 5 5  Hip adduction    Hip internal rotation    Hip external rotation    Knee flexion 5 5  Knee extension 5 5  Ankle dorsiflexion    Ankle plantarflexion    Ankle inversion    Ankle eversion      LUMBAR SPECIAL TESTS:  Straight leg raise test: Negative    TREATMENT DATE:   12/26/23 Nustep L 5 7 min 10# cable pulleys shld ext (10#) and rows (15#)2 sets 10 Cable pulley upright row into trunk ext 2 sets 10 15# 8# farmer carry 150 feet in each hand-cued to stand up tall STS with 5# wt chest press 10 x Black tband trunk flex and ext 20 x Feet on ball bridge, KTC ,obl and iso abdominals Clams DL 10 x then SL 10 x green tband Hip flexion supine green tband 20 x SLR 10 xBIL SLR with abd 10 x PROM and stretching LE and trunk Seated active HS stretching and calf stretching   12/22/23 Nustep L 4 10# cable pulleys shld ext and rows 2 sets 10 20# resisted gait 4 way 5 x each Feet on ball at knees bridge 10x,KTC and obl 10 each.iso abdominals 10 x ADD ball squeeze 10 x then 10x with low bridge Clams DL 10 x then SL 10 x green tband Hip flexion supine green tband 20 x PROM and stretching LE and trunk Standing hip ext and abd 10 x BIL red tband Wt ball trunk ext at wall 10 x    12/05/23  Exam, POC, HEP                                                                                                                                    PATIENT EDUCATION:  Education  details: exam findings, POC, HEP, general exercise precautions with aneurysm, recommended reclining bike or Nustep at the gym as this may be more tolerable than walking program  Person educated: Patient Education method: Explanation and Demonstration Education comprehension: verbalized understanding, returned demonstration, and needs further education  HOME EXERCISE PROGRAM:  Access Code: [email or web address] URL: [email or web address] Date: 12/05/2023 Prepared by: Terrel Ferries  Exercises - Supine Transversus Abdominis Bracing - Hands on Stomach  - 3 x daily - 7 x weekly - 1 sets - 10 reps - 1 second  hold - Hooklying Hamstring Stretch with Strap  - 2 x daily -  7 x weekly - 1 sets - 3 reps - 30 seconds  hold - Supine Figure 4 Piriformis Stretch  - 2 x daily - 7 x weekly - 1 sets - 3 reps - 30 seconds  hold - Modified Thomas Stretch  - 2 x daily - 7 x weekly - 1 sets - 3 reps - 30 seconds  hold    ASSESSMENT:  CLINICAL IMPRESSION:  Pt amb in with fwd lean and flexion at hips with fwd head. Stated he was okay after last session but has not made it to gym yet to try Nustep there. Progressed core and postural ex with cuing needed including gait with cuing for upright trunk.   Patient is a 73 y.o. M who was seen today for physical therapy evaluation and treatment for M54.42,M54.41,G89.29 (ICD-10-CM) - Chronic bilateral low back pain with bilateral sciatica. Objectives as above, he does have a significant aneurysm with possible upcoming surgery (awaiting formal vascular consult), we will need to be careful with this until medical team is able to work it up a bit more. Does have quite a bit MSK findings that we can address in PT- will progress as appropriate.   OBJECTIVE IMPAIRMENTS: decreased activity tolerance, decreased mobility, difficulty walking, decreased ROM, decreased strength, hypomobility, increased fascial restrictions, increased muscle spasms, impaired flexibility, improper body  mechanics, postural dysfunction, and pain.   ACTIVITY LIMITATIONS: carrying, lifting, standing, and locomotion level  PARTICIPATION LIMITATIONS: driving, shopping, community activity, and yard work  PERSONAL FACTORS: Age, Fitness, and Time since onset of injury/illness/exacerbation are also affecting patient's functional outcome.   REHAB POTENTIAL: Fair multiple medical co-morbidities influencing health   CLINICAL DECISION MAKING: Evolving/moderate complexity  EVALUATION COMPLEXITY: Moderate   GOALS: Goals reviewed with patient? No  SHORT TERM GOALS: Target date: 12/26/2023    Will be compliant with appropriate progressive HEP  Baseline: Goal status: progressing 12/22/23  2.  Will demonstrate good biomechanics for bed mobility and floor to waist transfers  Baseline:  Goal status: INITIAL  3.  Will be able to verbalize basic precautions to avoid damaging known aneurysm and principles of progressions on reclining bike  Baseline:  Goal status: INITIAL   LONG TERM GOALS: Target date: 01/16/2024    Pain to be no more than 4/10 at worst  Baseline:  Goal status: INITIAL  2.  Will be able to stand for at least 45 minutes without increase in back pain or radicular sx  Baseline:  Goal status: INITIAL  3.  Will be able to walk for at least 45 minutes without increase in back pain or radicular symptoms  Baseline:  Goal status: INITIAL  4.  Lumbar and BLE flexibility impairments to be no more than 25% limited  Baseline:  Goal status: INITIAL  5.  Will tolerate regular gym based exercise program safe for known aneurysm without increase in pain  Baseline:  Goal status: INITIAL  6.  PSFS score to have improved by 2 points  Baseline:  Goal status: INITIAL  PLAN:  PT FREQUENCY: 2x/week  PT DURATION: 6 weeks  PLANNED INTERVENTIONS: 97750- Physical Performance Testing, 97110-Therapeutic exercises, 97530- Therapeutic activity, [id number]- Neuromuscular re-education, 97535- Self  Care, [id number]- Manual therapy, [id number]- Aquatic Therapy, Taping, and Dry Needling.  PLAN FOR NEXT SESSION: caution with known aneurysm- avoid valgus maneuver and heavy resistance training for now- careful core strengthening, biomechanics, LE and back mobility/flexibility, postural training    Patient Details  Name: Jackson French MRN: [id number] Date of Birth: 11/13/1950 Referring Provider:  Syliva Even, MD  Encounter Date: 12/26/2023   Aquilla Bayley, PTA 12/26/2023, 8:36 AM  [facility] [address] [place], [place], [address] Phone: [phone]   Fax:  [phone] [address] [place], [place], [address] Phone: [phone]   Fax:  [phone]  Patient Details  Name: Jackson French MRN: [id number] Date of Birth: Jan 03, 1951 Referring Provider:  Syliva Even, MD

## 2023-12-27 ENCOUNTER — Encounter: Payer: Self-pay | Admitting: Cardiology

## 2023-12-27 ENCOUNTER — Ambulatory Visit: Attending: Cardiology | Admitting: Cardiology

## 2023-12-27 VITALS — BP 158/80 | HR 76 | Ht 73.0 in | Wt 302.0 lb

## 2023-12-27 DIAGNOSIS — R072 Precordial pain: Secondary | ICD-10-CM

## 2023-12-27 DIAGNOSIS — I10 Essential (primary) hypertension: Secondary | ICD-10-CM

## 2023-12-27 DIAGNOSIS — I2510 Atherosclerotic heart disease of native coronary artery without angina pectoris: Secondary | ICD-10-CM

## 2023-12-27 DIAGNOSIS — Z136 Encounter for screening for cardiovascular disorders: Secondary | ICD-10-CM | POA: Diagnosis not present

## 2023-12-27 DIAGNOSIS — I77810 Thoracic aortic ectasia: Secondary | ICD-10-CM | POA: Diagnosis not present

## 2023-12-27 DIAGNOSIS — Z8489 Family history of other specified conditions: Secondary | ICD-10-CM | POA: Diagnosis not present

## 2023-12-27 DIAGNOSIS — R0989 Other specified symptoms and signs involving the circulatory and respiratory systems: Secondary | ICD-10-CM | POA: Diagnosis not present

## 2023-12-27 MED ORDER — ROSUVASTATIN CALCIUM 40 MG PO TABS: 40.0000 mg | ORAL_TABLET | Freq: Every day | ORAL | 2 refills | Status: AC

## 2023-12-27 MED ORDER — LOSARTAN POTASSIUM 100 MG PO TABS: 100.0000 mg | ORAL_TABLET | Freq: Every day | ORAL | 3 refills | Status: AC

## 2023-12-27 NOTE — Patient Instructions (Addendum)
 Medication Instructions:   INCREASE LOSARTAN  TO 100 MG ONCE DAILY=2 OF THE 50 MG TABLETS ONCE DAILY  *If you need a refill on your cardiac medications before your next appointment, please call your pharmacy*  Lab Work:  Your physician recommends that you return for lab work in: ONE WEEK  [employer] on the 3 rd floor in [address] Hours-Monday - Friday 8 am-11:30 AM and 1 pm -4 pm   If you have labs (blood work) drawn today and your tests are completely normal, you will receive your results only by: MyChart Message (if you have MyChart) OR A paper copy in the mail If you have any lab test that is abnormal or we need to change your treatment, we will call you to review the results.  Testing/Procedures:  Your physician has requested that you have an abdominal aorta duplex. During this test, an ultrasound is used to evaluate the aorta. Allow 30 minutes for this exam. Do not eat after midnight the day before and avoid carbonated beverages  [facility] 1 ST FLOOR IMAGING DEPARTMENT  Please note: We ask at that you not bring children with you during ultrasound (echo/ vascular) testing. Due to room size and safety concerns, children are not allowed in the ultrasound rooms during exams. Our front office staff cannot provide observation of children in our lobby area while testing is being conducted. An adult accompanying a patient to their appointment will only be allowed in the ultrasound room at the discretion of the ultrasound technician under special circumstances. We apologize for any inconvenience.    CTA OF THE CHEST IN [facility]  Follow-Up: At [facility], you and your health needs are our priority.  As part of our continuing mission to provide you with exceptional heart care, our providers are all part of one team.  This team includes your primary Cardiologist (physician) and Advanced Practice Providers or APPs (Physician  Assistants and Nurse Practitioners) who all work together to provide you with the care you need, when you need it.  Your next appointment:   6 month(s)  Provider:   Alexandria Angel, MD

## 2023-12-27 NOTE — Addendum Note (Signed)
 Addended by: Magaline Steinberg W on: 12/27/2023 12:08 PM   Modules accepted: Orders

## 2023-12-27 NOTE — Addendum Note (Signed)
 Addended by: Render Carrie on: 12/27/2023 12:15 PM   Modules accepted: Orders

## 2023-12-28 ENCOUNTER — Ambulatory Visit: Admitting: Physical Therapy

## 2023-12-28 ENCOUNTER — Encounter: Payer: Self-pay | Admitting: Physical Therapy

## 2023-12-28 DIAGNOSIS — R262 Difficulty in walking, not elsewhere classified: Secondary | ICD-10-CM

## 2023-12-28 DIAGNOSIS — R29898 Other symptoms and signs involving the musculoskeletal system: Secondary | ICD-10-CM | POA: Diagnosis not present

## 2023-12-28 DIAGNOSIS — M5459 Other low back pain: Secondary | ICD-10-CM | POA: Diagnosis not present

## 2023-12-28 NOTE — Therapy (Signed)
 OUTPATIENT PHYSICAL THERAPY THORACOLUMBAR TREATMENT   Patient Name: Jackson French MRN: [id number] DOB:10-Jun-1951, 73 y.o., male Today's Date: 12/28/2023  END OF SESSION:  PT End of Session - 12/28/23 0951     Visit Number 4    Number of Visits 13    Date for PT Re-Evaluation 01/30/24    Authorization Type MCR and Mutual    Authorization Time Period 12/05/23 to 01/30/24    Progress Note Due on Visit 10    PT Start Time 0933    PT Stop Time 1011    PT Time Calculation (min) 38 min    Activity Tolerance Patient tolerated treatment well    Behavior During Therapy [facility] for tasks assessed/performed              Past Medical History:  Diagnosis Date   Allergy    Cataract removed IOLs ou   GERD (gastroesophageal reflux disease) treated by gastro   Gout    Hyperlipidemia    Hypertension    Sleep apnea    no CPAP   Thyroid  disease    Past Surgical History:  Procedure Laterality Date   APPENDECTOMY     CATARACT EXTRACTION, BILATERAL Bilateral    COLONOSCOPY  04/19/2022   EYE SURGERY     FRACTURE SURGERY     HYDROCELE EXCISION / REPAIR     THYROID  SURGERY     TONSILLECTOMY     VASECTOMY     Patient Active Problem List   Diagnosis Date Noted   Elevated PSA 01/18/2018   Chronic constipation 03/07/2017   Dyslipidemia 03/07/2017   Hypothyroidism 03/07/2017   History of gout 03/07/2017   OSA (obstructive sleep apnea) 03/07/2017   Essential hypertension 05/17/2016   Severe obesity (BMI 35.0-35.9 with comorbidity) (HCC) 05/17/2016   Umbilical hernia without obstruction and without gangrene 12/23/2014    PCP: Francine Iron   REFERRING PROVIDER: Syliva Even, MD  REFERRING DIAG: M54.42,M54.41,G89.29 (ICD-10-CM) - Chronic bilateral low back pain with bilateral sciatica  Rationale for Evaluation and Treatment: Rehabilitation  THERAPY DIAG:  Other low back pain  Other symptoms and signs involving the musculoskeletal system  Difficulty in walking, not  elsewhere classified  ONSET DATE: aneurysm dx-ed August 2024,   SUBJECTIVE:                                                                                                                                                                                           SUBJECTIVE STATEMENT:  Saw cardiologist yesterday, he just said that it was a moderate sized aneurysm and we will watch it. Will go back to see him again  in about 6 months. Would still like to lose weight, I have an appointment with the dietician next week. Haven't been going to the gym yet, [facility]/Nustep. Not having as much back pain as rapidly as I was before. They are doing another US  to see if I have additional aneurysms below the level of this one early June     I've been having some issues with my back but also have an aneurysm that I have some appts for in May. Seeing vascular surgeon at the end of May. I had been trying to work out- walking and weights- and then got the news about the aneurysm and stopped exercising, gained weight. Back feels muscular to me. I do have a neuralgia in my hip when I sleep, it gets numb in the upper part of my thigh and have gotten some snapping/popping in my left hip no pain. My big concern is the weight gain, I feel like I don't know what to do in terms of exercising with the aneurysm.   PERTINENT HISTORY:  See above   PAIN:  Are you having pain? No 0/10 now just tight   PRECAUTIONS: Other: known aneurysm with possible upcoming surgical intervention (TBD)- avoid high inter-abdominal pressures/valgus   RED FLAGS: None   WEIGHT BEARING RESTRICTIONS: No  FALLS:  Has patient fallen in last 6 months? No, no FOF   LIVING ENVIRONMENT: Lives with: lives with their spouse Lives in: House/apartment   OCCUPATION: retired- optometrist   PLOF: Independent, Independent with basic ADLs, Independent with gait, and Independent with  transfers  PATIENT GOALS: figure out how to exercise safely given aneurysm, lose weight, address back pain and be able to walk/exercise more  NEXT MD VISIT: referring June 5th  OBJECTIVE:  Note: Objective measures were completed at Evaluation unless otherwise noted.  DIAGNOSTIC FINDINGS:   IMPRESSION: 1. Stable aneurysmal disease of the ascending thoracic aorta measuring approximately 4.6-4.7 cm in greatest diameter. Ascending thoracic aortic aneurysm. Recommend semi-annual imaging followup by CTA or MRA and referral to cardiothoracic surgery if not already obtained. This recommendation follows 2010 ACCF/AHA/AATS/ACR/ASA/SCA/SCAI/SIR/STS/SVM Guidelines for the Diagnosis and Management of Patients With Thoracic Aortic Disease. Circulation. 2010; 121: L244-W102. Aortic aneurysm NOS (ICD10-I71.9) 2. Stable dilatation of the aortic root measuring approximately 4.5-4.7 cm at the level of the sinuses of Valsalva. 3. Stable mild calcified coronary artery plaque.   Aortic aneurysm NOS (ICD10-I71.9).  CLINICAL DATA:  low back pain   EXAM: LUMBAR SPINE - 3 VIEW   COMPARISON:  11/17/2021.   FINDINGS: No fracture, dislocation or subluxation. Grade 1 L5 retrolisthesis. No osteolytic or osteoblastic changes.   Degenerative disc disease noted with disc space narrowing and marginal osteophytes at T12-L1, L1-L2 and L5-S1. Facet joint degenerative changes with sclerosis and osteophytes L4-5 and L5-S1.   IMPRESSION: Degenerative changes. No acute osseous abnormalities.  PATIENT SURVEYS:   Patient-Specific Activity Scoring Scheme  "0" represents "unable to perform." "10" represents "able to perform at prior level. 0 1 2 3 4 5 6 7 8 9  10 (Date and Score)   Activity Eval     1. Standing for extended periods  6     2. Walking for extended periods   3    3. Exercising in context of aneurysm  2   4.    5.    Score 3.7    Total score = sum of the activity scores/number of  activities Minimum detectable change (90%CI) for average score = 2 points Minimum detectable change (90%CI) for single activity score = 3 points      COGNITION: Overall cognitive status: Within functional limits for tasks assessed     SENSATION: None now, gets some numbness into L thigh when laying on L side (after about 30 minutes)- sleeping on stomach resolves this   MUSCLE LENGTH:  Hip flexors severe limitation B per functional observation  HS severe limitation B Piriformis severe limitation B   POSTURE: rounded shoulders, forward head, decreased lumbar lordosis, and increased thoracic kyphosis  PALPATION: Lumbar/thoracic paraspinals very tight but not tender   LUMBAR ROM:   AROM eval  Flexion WNL but very tight HS   Extension Severe limitation REIS no change   Right lateral flexion Mod limitation   Left lateral flexion Mod limitation   Right rotation Mod limitation   Left rotation Mod limitation    (Blank rows = not tested)    LOWER EXTREMITY MMT:    MMT Right eval Left eval  Hip flexion 5 5  Hip extension    Hip abduction 5 5  Hip adduction    Hip internal rotation    Hip external rotation    Knee flexion 5 5  Knee extension 5 5  Ankle dorsiflexion    Ankle plantarflexion    Ankle inversion    Ankle eversion      LUMBAR SPECIAL TESTS:  Straight leg raise test: Negative    TREATMENT DATE:    12/28/23  Education on what to avoid in context of aneurysm in terms of exercising at the gym and what is just fine for him to do, encouraged speaking with exercises with registered dietician as well when he sees her next week, encouraged reclining bikes but avoiding valgus maneuver/resistance training at gym. Elevated HR with exercise is OK as long as intra-abdominal pressure is not increasing   Supine hip flexor stretch 3x60 seconds B Standing trunk extensions against wall 10x10 second holds for upright posture  Upright against wall (back of head  touching) scap retractions red TB 15x2 seconds  Thoracic extensions x15  Pec stretch in doorway 3x30 seconds   Nustep L5x8 minutes seat 12 all four extremities    12/26/23 Nustep L 5 7 min 10# cable pulleys shld ext (10#) and rows (15#)2 sets 10 Cable pulley upright row into trunk ext 2 sets 10 15# 8# farmer carry 150 feet in each hand-cued to stand up tall STS with 5# wt chest press 10 x Black tband trunk flex and ext 20 x Feet on ball bridge, KTC ,obl and iso abdominals Clams DL 10 x then SL 10 x green tband Hip flexion supine green tband 20 x SLR 10 xBIL SLR with abd 10 x PROM and stretching LE and trunk Seated active HS stretching and calf stretching   12/22/23 Nustep L 4 10# cable pulleys shld ext and rows 2 sets 10 20# resisted gait 4 way 5 x each Feet on ball at knees bridge 10x,KTC and obl 10 each.iso abdominals 10 x ADD ball squeeze 10 x then 10x with low bridge Clams DL 10 x then SL 10 x green tband Hip flexion supine green tband 20 x PROM and stretching LE and trunk Standing hip ext and abd 10 x BIL red tband Wt ball trunk ext at wall 10 x    12/05/23  Exam, POC, HEP  PATIENT EDUCATION:  Education details: exam findings, POC, HEP, general exercise precautions with aneurysm, recommended reclining bike or Nustep at the gym as this may be more tolerable than walking program  Person educated: Patient Education method: Explanation and Demonstration Education comprehension: verbalized understanding, returned demonstration, and needs further education  HOME EXERCISE PROGRAM:  Access Code: [email or web address] URL: [email or web address] Date: 12/05/2023 Prepared by: Terrel Ferries  Exercises - Supine Transversus Abdominis Bracing - Hands on Stomach  - 3 x daily - 7 x weekly - 1 sets - 10 reps - 1 second  hold - Hooklying  Hamstring Stretch with Strap  - 2 x daily - 7 x weekly - 1 sets - 3 reps - 30 seconds  hold - Supine Figure 4 Piriformis Stretch  - 2 x daily - 7 x weekly - 1 sets - 3 reps - 30 seconds  hold - Modified Thomas Stretch  - 2 x daily - 7 x weekly - 1 sets - 3 reps - 30 seconds  hold    ASSESSMENT:  CLINICAL IMPRESSION:  Saw the cardiologist, sounds like they are just monitoring for now- continued encouraging exercise at the gym especially cardio on reclining bikes within appropriate precautions for moderate aneurysm. Kept working on exercises/activities appropriate for his case. Continues to be fairly limited by chronic postural limitations.    Patient is a 73 y.o. M who was seen today for physical therapy evaluation and treatment for M54.42,M54.41,G89.29 (ICD-10-CM) - Chronic bilateral low back pain with bilateral sciatica. Objectives as above, he does have a significant aneurysm with possible upcoming surgery (awaiting formal vascular consult), we will need to be careful with this until medical team is able to work it up a bit more. Does have quite a bit MSK findings that we can address in PT- will progress as appropriate.   OBJECTIVE IMPAIRMENTS: decreased activity tolerance, decreased mobility, difficulty walking, decreased ROM, decreased strength, hypomobility, increased fascial restrictions, increased muscle spasms, impaired flexibility, improper body mechanics, postural dysfunction, and pain.   ACTIVITY LIMITATIONS: carrying, lifting, standing, and locomotion level  PARTICIPATION LIMITATIONS: driving, shopping, community activity, and yard work  PERSONAL FACTORS: Age, Fitness, and Time since onset of injury/illness/exacerbation are also affecting patient's functional outcome.   REHAB POTENTIAL: Fair multiple medical co-morbidities influencing health   CLINICAL DECISION MAKING: Evolving/moderate complexity  EVALUATION COMPLEXITY: Moderate   GOALS: Goals reviewed with patient?  No  SHORT TERM GOALS: Target date: 12/26/2023    Will be compliant with appropriate progressive HEP  Baseline: Goal status: progressing 12/22/23  2.  Will demonstrate good biomechanics for bed mobility and floor to waist transfers  Baseline:  Goal status: INITIAL  3.  Will be able to verbalize basic precautions to avoid damaging known aneurysm and principles of progressions on reclining bike  Baseline:  Goal status: INITIAL   LONG TERM GOALS: Target date: 01/16/2024    Pain to be no more than 4/10 at worst  Baseline:  Goal status: INITIAL  2.  Will be able to stand for at least 45 minutes without increase in back pain or radicular sx  Baseline:  Goal status: INITIAL  3.  Will be able to walk for at least 45 minutes without increase in back pain or radicular symptoms  Baseline:  Goal status: INITIAL  4.  Lumbar and BLE flexibility impairments to be no more than 25% limited  Baseline:  Goal status: INITIAL  5.  Will tolerate regular gym based exercise program safe for known aneurysm without increase in  pain  Baseline:  Goal status: INITIAL  6.  PSFS score to have improved by 2 points  Baseline:  Goal status: INITIAL  PLAN:  PT FREQUENCY: 2x/week  PT DURATION: 6 weeks  PLANNED INTERVENTIONS: 97750- Physical Performance Testing, 97110-Therapeutic exercises, 97530- Therapeutic activity, [id number]- Neuromuscular re-education, 97535- Self Care, 16109- Manual therapy, [id number]- Aquatic Therapy, Taping, and Dry Needling.  PLAN FOR NEXT SESSION: caution with known aneurysm- avoid valgus maneuver and heavy resistance training for now- careful core strengthening, biomechanics, LE and back mobility/flexibility, postural training. Continue to encourage cardio at the gym    Terrel Ferries, PT, DPT 12/28/23 10:11 AM

## 2024-01-02 ENCOUNTER — Ambulatory Visit (INDEPENDENT_AMBULATORY_CARE_PROVIDER_SITE_OTHER): Admitting: Physician Assistant

## 2024-01-02 ENCOUNTER — Encounter (INDEPENDENT_AMBULATORY_CARE_PROVIDER_SITE_OTHER): Payer: Self-pay | Admitting: Physician Assistant

## 2024-01-02 VITALS — BP 139/84 | HR 84 | Temp 97.8°F | Ht 71.5 in | Wt 297.0 lb

## 2024-01-02 DIAGNOSIS — Z6841 Body Mass Index (BMI) 40.0 and over, adult: Secondary | ICD-10-CM | POA: Diagnosis not present

## 2024-01-02 DIAGNOSIS — Z0289 Encounter for other administrative examinations: Secondary | ICD-10-CM

## 2024-01-02 DIAGNOSIS — E66813 Obesity, class 3: Secondary | ICD-10-CM

## 2024-01-02 DIAGNOSIS — K2100 Gastro-esophageal reflux disease with esophagitis, without bleeding: Secondary | ICD-10-CM

## 2024-01-02 DIAGNOSIS — E785 Hyperlipidemia, unspecified: Secondary | ICD-10-CM

## 2024-01-02 DIAGNOSIS — E039 Hypothyroidism, unspecified: Secondary | ICD-10-CM | POA: Diagnosis not present

## 2024-01-02 DIAGNOSIS — M545 Low back pain, unspecified: Secondary | ICD-10-CM

## 2024-01-02 DIAGNOSIS — R001 Bradycardia, unspecified: Secondary | ICD-10-CM

## 2024-01-02 DIAGNOSIS — K429 Umbilical hernia without obstruction or gangrene: Secondary | ICD-10-CM

## 2024-01-02 DIAGNOSIS — I10 Essential (primary) hypertension: Secondary | ICD-10-CM

## 2024-01-02 DIAGNOSIS — M79605 Pain in left leg: Secondary | ICD-10-CM | POA: Diagnosis not present

## 2024-01-02 DIAGNOSIS — I712 Thoracic aortic aneurysm, without rupture, unspecified: Secondary | ICD-10-CM | POA: Diagnosis not present

## 2024-01-02 DIAGNOSIS — K5909 Other constipation: Secondary | ICD-10-CM

## 2024-01-02 DIAGNOSIS — E538 Deficiency of other specified B group vitamins: Secondary | ICD-10-CM

## 2024-01-02 DIAGNOSIS — M79604 Pain in right leg: Secondary | ICD-10-CM

## 2024-01-02 DIAGNOSIS — Z8739 Personal history of other diseases of the musculoskeletal system and connective tissue: Secondary | ICD-10-CM | POA: Diagnosis not present

## 2024-01-02 DIAGNOSIS — G4733 Obstructive sleep apnea (adult) (pediatric): Secondary | ICD-10-CM

## 2024-01-02 DIAGNOSIS — E66812 Obesity, class 2: Secondary | ICD-10-CM

## 2024-01-02 NOTE — Progress Notes (Signed)
 Office: [phone]  /  Fax: [phone]   Initial Visit    Jackson French was seen in clinic today to evaluate for obesity. He is interested in losing weight to improve overall health and reduce the risk of weight related complications. He presents today to review program treatment options.  Jackson French is a 73 year old male who presents for initial evaluation for obesity treatment.  He has experienced weight gain, particularly after being diagnosed with a thoracic aortic aneurysm last year, which led to a cessation of his workout routine. His goal weight was 279 pounds, but he has since gained weight due to stress and reduced physical activity. He has not significantly increased his food intake but acknowledges a decrease in physical activity.  He has a history of back pain which has impacted his ability to walk and exercise. He is currently attending rehabilitation to improve his back condition and has been able to walk some. He plans to return to the [facility] to engage in low-impact exercises such as walking and using a recumbent bike. He has a Research scientist (physical sciences) at [employer] and was previously engaged in [employer] and walking. He is now considering returning to these activities as his back condition improves.  He takes care of his grandchildren, which keeps him active. He has a history of using [employer] meals for convenience due to fatigue in the evenings after caring for his grandchildren. Some of these meals can be calorie-heavy, and he is considering adjusting his meal choices to reduce calorie intake.  He experiences fatigue and poor endurance, which he attributes to his weight and back pain. No significant issues with eating patterns or strong cravings. He has previously attempted weight loss with Optifast in 2018, losing about 20 pounds in a few weeks, but discontinued due to headaches and the unsustainable nature of the diet.  He was referred by: PCP  When asked what else they would  like to accomplish? He states: Adopt healthier eating patterns, Improve energy levels and physical activity, Improve existing medical conditions, Improve quality of life, Improve appearance, Improve self-confidence, and Lose a target amount of weight : 20-25 lbs in 6-12 months.  When asked how has your weight affected you? He states: Has affected self-esteem, Contributed to medical problems, Contributed to orthopedic problems or mobility issues, Having fatigue, Having poor endurance, and Has affected mood   Weight history:  He has experienced weight gain, particularly after being diagnosed with a thoracic aortic aneurysm last year, which led to a cessation of his workout routine. His goal weight was 279 pounds, but he has since gained weight due to stress and reduced physical activity. He has not significantly increased his food intake but acknowledges a decrease in physical activity. Tried Opifast through [facility] and lost weight but only for several weeks as he developed headaches and felt the very low calorie restricted plan was not sustainable.   Highest weight: 302 lbs at PCP office.   Some associated conditions: Hypertension, Arthritis:back, Hyperlipidemia, OSA, GERD, ASCVD, and Other: Thoracic Aortic aneurysm  Contributing factors: family history of obesity, disruption of circadian rhythm / sleep disordered breathing, consumption of processed foods, moderate to high levels of stress, reduced physical acitivity, slow metabolism for age, and need for convenience due to lack of time  Weight promoting medications identified: None  Prior weight loss attempts: None  Current nutrition plan: Prepackaged nutrition-  Factor meal plan  Current level of physical activity: Limited due to chronic pain or orthopedic problems and  Walking 10 minutes, twice and three a week  Current or previous pharmacotherapy: Other: Optifast at [facility] No medications.   Response to medication: Could not continue  on program at [facility] but did not try medication for weight loss    Is not interested in trying medications at this time.    Past medical history includes:   Past Medical History:  Diagnosis Date   Allergy    Cataract removed IOLs ou   GERD (gastroesophageal reflux disease) treated by gastro   Gout    Hyperlipidemia    Hypertension    Sleep apnea    no CPAP   Thyroid  disease      Objective    BP 139/84   Pulse 84   Temp 97.8 F (36.6 C)   Ht 5' 11.5" (1.816 m)   Wt 297 lb (134.7 kg)   SpO2 97%   BMI 40.85 kg/m  He was weighed on the bioimpedance scale: Body mass index is 40.85 kg/m.  Body Fat%:41.5%, Visceral Fat Rating:29, Weight trend over the last 12 months: Increasing  General:  Alert, oriented and cooperative. Patient is in no acute distress.  Respiratory: Normal respiratory effort, no problems with respiration noted   Gait: able to ambulate independently  Mental Status: Normal mood and affect. Normal behavior. Normal judgment and thought content.   DIAGNOSTIC DATA REVIEWED:  BMET    Component Value Date/Time   NA 141 09/13/2022 0952   K 4.3 09/13/2022 0952   CL 104 09/13/2022 0952   CO2 25 09/13/2022 0952   GLUCOSE 95 09/13/2022 0952   BUN 17 09/13/2022 0952   CREATININE 0.98 09/13/2022 0952   CALCIUM  9.2 09/13/2022 0952   Lab Results  Component Value Date   HGBA1C 5.4 06/14/2022   HGBA1C 5.5 02/19/2018   No results found for: "INSULIN " CBC    Component Value Date/Time   WBC 7.1 12/28/2022 1400   RBC 4.88 12/28/2022 1400   HGB 15.2 12/28/2022 1400   HCT 45.1 12/28/2022 1400   PLT 226.0 12/28/2022 1400   MCV 92.4 12/28/2022 1400   MCHC 33.8 12/28/2022 1400   RDW 12.7 12/28/2022 1400   Iron/TIBC/Ferritin/ %Sat No results found for: "IRON", "TIBC", "FERRITIN", "IRONPCTSAT" Lipid Panel     Component Value Date/Time   CHOL 82 (L) 05/09/2023 1007   TRIG 128 05/09/2023 1007   HDL 35 (L) 05/09/2023 1007   CHOLHDL 2.3 05/09/2023 1007    CHOLHDL 3 09/13/2022 0952   VLDL 27.0 09/13/2022 0952   LDLCALC 24 05/09/2023 1007   Hepatic Function Panel     Component Value Date/Time   PROT 6.5 05/09/2023 1007   ALBUMIN 4.3 05/09/2023 1007   AST 26 05/09/2023 1007   ALT 30 05/09/2023 1007   ALKPHOS 76 05/09/2023 1007   BILITOT 0.6 05/09/2023 1007   BILIDIR 0.19 05/09/2023 1007      Component Value Date/Time   TSH 1.37 05/24/2022 0858     Assessment and Plan   Thoracic aortic aneurysm without rupture, unspecified part (HCC)  Hypertension, unspecified type  Hypothyroidism, unspecified type  Bilateral low back pain without sciatica, unspecified chronicity  Bradycardia  B12 deficiency  Hyperlipidemia, unspecified hyperlipidemia type  Chronic constipation  OSA (obstructive sleep apnea)  Gastroesophageal reflux disease with esophagitis without hemorrhage  History of gout  Umbilical hernia without obstruction and without gangrene  Class 3 severe obesity due to excess calories with serious comorbidity and body mass index (BMI) of 40.0 to 44.9 in adult  Current BMI 40.85 Assessment and Plan Assessment & Plan Obesity Obesity with weight gain from 279 lbs, exacerbated by stress and decreased physical activity due to thoracic aortic aneurysm and back pain. Interested in weight management and referred for obesity treatment. Potential metabolic factors include insulin  resistance and decreased metabolism due to reduced activity. Emphasized a holistic approach, including dietary modifications and increased physical activity as tolerated. Discussed metabolism testing to assess caloric needs and potential insulin  resistance, and the development of a personalized nutrition plan. Highlighted sustainable weight loss through everyday grocery foods rather than special products. - Schedule metabolism testing to assess caloric needs and potential insulin  resistance. - Develop a personalized nutrition plan based on metabolism  test results. - Encourage resumption of physical activity, including walking and use of a recumbent bike, as tolerated/ as allowed by cardiology. - Schedule follow-up appointments every 2-3 weeks for the first 3 months to address challenges and adjust the plan as needed. - Discuss the use of prepared meals like Factor for convenience, ensuring calorie content is appropriate for weight loss goals.  Bilateral lower extremity and back pain Chronic bilateral lower extremity and back pain, contributing to decreased physical activity and weight gain. Undergoing rehabilitation to improve mobility and manage pain. Discussed potential benefits of pool therapy to reduce stress on joints and improve mobility. - Continue rehabilitation therapy to improve mobility and manage pain. - Consider pool therapy to reduce joint stress and improve mobility.  Thoracic aortic aneurysm without rupture Thoracic aortic aneurysm, small to moderate in size, monitored by cardiology. No current symptoms or pain. Scheduled for a follow-up CT scan in August to assess changes in aneurysm size. Advised on low-impact physical activities to avoid stress on the aneurysm. - Continue monitoring with cardiology and follow-up CT scan in August. - Advise on low-impact physical activities to avoid stress on the aneurysm. -Advised excellent BP control to decrease risks.   Hypertension Hypertension managed with losartan , increased from 50 mg to 100 mg due to concerns related to the thoracic aortic aneurysm. Blood pressure today was 139/84 mmHg, slightly above the goal of 130/80 mmHg. Discussed importance of accurate blood pressure monitoring, especially given the aneurysm. Emphasized need for appropriate cuff size for accurate measurements. - Coordinate with cardiology for blood pressure management and monitoring. - Ensure accurate blood pressure measurements with appropriate cuff size.  Hypothyroidism Hypothyroidism managed with  levothyroxine . Discussed need for regular monitoring of thyroid  function tests to ensure adequate control. - Coordinate with primary care for regular thyroid  function tests, including T3 and T4 levels.        Obesity Treatment / Action Plan:  Patient will work on garnering support from family and friends to begin weight loss journey. Will work on eliminating or reducing the presence of highly palatable, calorie dense foods in the home. Will complete provided nutritional and psychosocial assessment questionnaire before the next appointment. Will be scheduled for indirect calorimetry to determine resting energy expenditure in a fasting state.  This will allow us  to create a reduced calorie, high-protein meal plan to promote loss of fat mass while preserving muscle mass. Will think about ideas on how to incorporate physical activity into their daily routine. Will work on managing stress via relaxation methods as this may result in unhealthy eating patterns. Will work on reading labels, making healthier choices and watching portion sizes. Counseled on the health benefits of losing 5%-15% of total body weight. Will work on improving sleep hygiene and trying to obtain at least 7 hours of  sleep. Was counseled on nutritional approaches to weight loss and benefits of reducing processed foods and consuming plant-based foods and high quality protein as part of nutritional weight management. Was counseled on pharmacotherapy and role as an adjunct in weight management.   Obesity Education Performed Today:  He was weighed on the bioimpedance scale and results were discussed and documented in the synopsis.  We discussed obesity as a disease and the importance of a more detailed evaluation of all the factors contributing to the disease.  We discussed the importance of long term lifestyle changes which include nutrition, exercise and behavioral modifications as well as the importance of customizing this to  his specific health and social needs.  We discussed the benefits of reaching a healthier weight to alleviate the symptoms of existing conditions and reduce the risks of the biomechanical, metabolic and psychological effects of obesity.  We reviewed the four pillars of obesity medicine and importance of using a multimodal approach.  We reviewed the basic principles in weight management.   Jackson French appears to be in the action stage of change and states they are ready to start intensive lifestyle modifications and behavioral modifications.  I have spent 30 minutes in the care of the patient today including: 10 minutes before the visit reviewing and preparing the chart. 15 minutes face-to-face assessing and reviewing listed medical problems as outlined in obesity care plan, providing nutritional and behavioral counseling on topics outlined in the obesity care plan, independently interpreting test results and goals of care, as described in assessment and plan, reviewing and discussing biometric information and progress, and reviewing latest PCP notes and specialist consultations 5 minutes after the visit updating chart and documentation of encounter.  Reviewed by clinician on day of visit: allergies, medications, problem list, medical history, surgical history, family history, social history, and previous encounter notes pertinent to obesity diagnosis.   Sukhraj Esquivias,PA-C

## 2024-01-03 ENCOUNTER — Encounter: Payer: Self-pay | Admitting: Physical Therapy

## 2024-01-03 ENCOUNTER — Ambulatory Visit: Admitting: Physical Therapy

## 2024-01-03 DIAGNOSIS — M5459 Other low back pain: Secondary | ICD-10-CM

## 2024-01-03 DIAGNOSIS — R262 Difficulty in walking, not elsewhere classified: Secondary | ICD-10-CM

## 2024-01-03 DIAGNOSIS — R29898 Other symptoms and signs involving the musculoskeletal system: Secondary | ICD-10-CM

## 2024-01-03 NOTE — Therapy (Signed)
 OUTPATIENT PHYSICAL THERAPY THORACOLUMBAR TREATMENT   Patient Name: Jackson French MRN: [id number] DOB:[birth year], 73 y.o., male Today's Date: 01/03/2024  END OF SESSION:  PT End of Session - 01/03/24 0951     Visit Number 5    Number of Visits 13    Date for PT Re-Evaluation 01/30/24    Authorization Type MCR and Mutual    Authorization Time Period 12/05/23 to 01/30/24    Progress Note Due on Visit 10    PT Start Time 0932    PT Stop Time 1011    PT Time Calculation (min) 39 min    Activity Tolerance Patient tolerated treatment well    Behavior During Therapy [facility] for tasks assessed/performed               Past Medical History:  Diagnosis Date   Allergy    Cataract removed IOLs ou   GERD (gastroesophageal reflux disease) treated by gastro   Gout    Hyperlipidemia    Hypertension    Sleep apnea    no CPAP   Thyroid  disease    Past Surgical History:  Procedure Laterality Date   APPENDECTOMY     CATARACT EXTRACTION, BILATERAL Bilateral    COLONOSCOPY  04/19/2022   EYE SURGERY     FRACTURE SURGERY     HYDROCELE EXCISION / REPAIR     THYROID  SURGERY     TONSILLECTOMY     VASECTOMY     Patient Active Problem List   Diagnosis Date Noted   Elevated PSA 01/18/2018   Chronic constipation 03/07/2017   Dyslipidemia 03/07/2017   Hypothyroidism 03/07/2017   History of gout 03/07/2017   OSA (obstructive sleep apnea) 03/07/2017   Essential hypertension 05/17/2016   Severe obesity (BMI 35.0-35.9 with comorbidity) (HCC) 05/17/2016   Umbilical hernia without obstruction and without gangrene 12/23/2014    PCP: Francine Iron   REFERRING PROVIDER: Syliva Even, MD  REFERRING DIAG: M54.42,M54.41,G89.29 (ICD-10-CM) - Chronic bilateral low back pain with bilateral sciatica  Rationale for Evaluation and Treatment: Rehabilitation  THERAPY DIAG:  Other low back pain  Other symptoms and signs involving the musculoskeletal system  Difficulty in walking, not  elsewhere classified  ONSET DATE: aneurysm dx-ed August 2024  SUBJECTIVE:                                                                                                                                                                                           SUBJECTIVE STATEMENT:  Things are feeling better, have been trying to walk more but didn't yesterday with the rain. Walking about 2500 steps per day, trying to get  back to 4000. Not back at gym yet, planning on going back to gym tomorrow. Haven't had a chance due to appts.     I've been having some issues with my back but also have an aneurysm that I have some appts for in May. Seeing vascular surgeon at the end of May. I had been trying to work out- walking and weights- and then got the news about the aneurysm and stopped exercising, gained weight. Back feels muscular to me. I do have a neuralgia in my hip when I sleep, it gets numb in the upper part of my thigh and have gotten some snapping/popping in my left hip no pain. My big concern is the weight gain, I feel like I don't know what to do in terms of exercising with the aneurysm.   PERTINENT HISTORY:  See above   PAIN:  Are you having pain? No 0/10 now  PRECAUTIONS: Other: known aneurysm with possible upcoming surgical intervention (TBD)- avoid high inter-abdominal pressures/valgus   RED FLAGS: None   WEIGHT BEARING RESTRICTIONS: No  FALLS:  Has patient fallen in last 6 months? No, no FOF   LIVING ENVIRONMENT: Lives with: lives with their spouse Lives in: House/apartment   OCCUPATION: retired- optometrist   PLOF: Independent, Independent with basic ADLs, Independent with gait, and Independent with transfers  PATIENT GOALS: figure out how to exercise safely given aneurysm, lose weight, address back pain and be able to walk/exercise more  NEXT MD VISIT: referring June 5th  OBJECTIVE:  Note: Objective measures were completed at Evaluation unless otherwise  noted.  DIAGNOSTIC FINDINGS:   IMPRESSION: 1. Stable aneurysmal disease of the ascending thoracic aorta measuring approximately 4.6-4.7 cm in greatest diameter. Ascending thoracic aortic aneurysm. Recommend semi-annual imaging followup by CTA or MRA and referral to cardiothoracic surgery if not already obtained. This recommendation follows 2010 ACCF/AHA/AATS/ACR/ASA/SCA/SCAI/SIR/STS/SVM Guidelines for the Diagnosis and Management of Patients With Thoracic Aortic Disease. Circulation. 2010; 121: A213-Y865. Aortic aneurysm NOS (ICD10-I71.9) 2. Stable dilatation of the aortic root measuring approximately 4.5-4.7 cm at the level of the sinuses of Valsalva. 3. Stable mild calcified coronary artery plaque.   Aortic aneurysm NOS (ICD10-I71.9).  CLINICAL DATA:  low back pain   EXAM: LUMBAR SPINE - 3 VIEW   COMPARISON:  11/17/2021.   FINDINGS: No fracture, dislocation or subluxation. Grade 1 L5 retrolisthesis. No osteolytic or osteoblastic changes.   Degenerative disc disease noted with disc space narrowing and marginal osteophytes at T12-L1, L1-L2 and L5-S1. Facet joint degenerative changes with sclerosis and osteophytes L4-5 and L5-S1.   IMPRESSION: Degenerative changes. No acute osseous abnormalities.  PATIENT SURVEYS:   Patient-Specific Activity Scoring Scheme  "0" represents "unable to perform." "10" represents "able to perform at prior level. 0 1 2 3 4 5 6 7 8 9  10 (Date and Score)   Activity Eval   01/03/24  1. Standing for extended periods  6   7  2. Walking for extended periods   3  6  3. Exercising in context of aneurysm  2 5  4.    5.    Score 3.7 6   Total score = sum of the activity scores/number of activities Minimum detectable change (90%CI) for average score = 2 points Minimum detectable change (90%CI) for single activity score = 3 points      COGNITION: Overall cognitive status: Within functional limits for tasks assessed     SENSATION: None  now, gets some numbness into L thigh when laying on L  side (after about 30 minutes)- sleeping on stomach resolves this   MUSCLE LENGTH:  Hip flexors severe limitation B per functional observation  HS severe limitation B Piriformis severe limitation B   POSTURE: rounded shoulders, forward head, decreased lumbar lordosis, and increased thoracic kyphosis  PALPATION: Lumbar/thoracic paraspinals very tight but not tender   LUMBAR ROM:   AROM eval  Flexion WNL but very tight HS   Extension Severe limitation REIS no change   Right lateral flexion Mod limitation   Left lateral flexion Mod limitation   Right rotation Mod limitation   Left rotation Mod limitation    (Blank rows = not tested)    LOWER EXTREMITY MMT:    MMT Right eval Left eval  Hip flexion 5 5  Hip extension    Hip abduction 5 5  Hip adduction    Hip internal rotation    Hip external rotation    Knee flexion 5 5  Knee extension 5 5  Ankle dorsiflexion    Ankle plantarflexion    Ankle inversion    Ankle eversion      LUMBAR SPECIAL TESTS:  Straight leg raise test: Negative    TREATMENT DATE:   01/03/24  PSFS 6 Nustep L5x8 minutes all four extremities seat 12   Continued to encourage exercises at gym that do not increase intra-abdominal pressures, encouraged Nustep and reclining bike. Discussed age predicted HR zones and target HR for exercise (88-117bpm for 60-80% of HRmax for his age), as well as typical vs problematic HR response in context of his known bradycardia and known benefit of cardio in managing elevated BP (lower BP beneficial for reducing stress on his aneurysm)  Wall Ts x15 0# Wall Ys x15 0# Wall Is + scap retraction x15  0# Thoracic ROM all directions x15-20 Biceps/pec stretch at wall 2x30 seconds B  Corner pec stretch 2x30 seconds  UBE backwards only L3/seat 8 x5 min for periscap/posterior shoulder strength    12/28/23  Education on what to avoid in context of aneurysm in  terms of exercising at the gym and what is just fine for him to do, encouraged speaking with exercises with registered dietician as well when he sees her next week, encouraged reclining bikes but avoiding valgus maneuver/resistance training at gym. Elevated HR with exercise is OK as long as intra-abdominal pressure is not increasing   Supine hip flexor stretch 3x60 seconds B Standing trunk extensions against wall 10x10 second holds for upright posture  Upright against wall (back of head touching) scap retractions red TB 15x2 seconds  Thoracic extensions x15  Pec stretch in doorway 3x30 seconds   Nustep L5x8 minutes seat 12 all four extremities    12/26/23 Nustep L 5 7 min 10# cable pulleys shld ext (10#) and rows (15#)2 sets 10 Cable pulley upright row into trunk ext 2 sets 10 15# 8# farmer carry 150 feet in each hand-cued to stand up tall STS with 5# wt chest press 10 x Black tband trunk flex and ext 20 x Feet on ball bridge, KTC ,obl and iso abdominals Clams DL 10 x then SL 10 x green tband Hip flexion supine green tband 20 x SLR 10 xBIL SLR with abd 10 x PROM and stretching LE and trunk Seated active HS stretching and calf stretching   12/22/23 Nustep L 4 10# cable pulleys shld ext and rows 2 sets 10 20# resisted gait 4 way 5 x each Feet on ball at knees bridge 10x,KTC and obl  10 each.iso abdominals 10 x ADD ball squeeze 10 x then 10x with low bridge Clams DL 10 x then SL 10 x green tband Hip flexion supine green tband 20 x PROM and stretching LE and trunk Standing hip ext and abd 10 x BIL red tband Wt ball trunk ext at wall 10 x    12/05/23  Exam, POC, HEP                                                                                                                                    PATIENT EDUCATION:  Education details: exam findings, POC, HEP, general exercise precautions with aneurysm, recommended reclining bike or Nustep at the gym as this may be more  tolerable than walking program  Person educated: Patient Education method: Explanation and Demonstration Education comprehension: verbalized understanding, returned demonstration, and needs further education  HOME EXERCISE PROGRAM:  Access Code: [email or web address] URL: [email or web address] Date: 12/05/2023 Prepared by: Terrel Ferries  Exercises - Supine Transversus Abdominis Bracing - Hands on Stomach  - 3 x daily - 7 x weekly - 1 sets - 10 reps - 1 second  hold - Hooklying Hamstring Stretch with Strap  - 2 x daily - 7 x weekly - 1 sets - 3 reps - 30 seconds  hold - Supine Figure 4 Piriformis Stretch  - 2 x daily - 7 x weekly - 1 sets - 3 reps - 30 seconds  hold - Modified Thomas Stretch  - 2 x daily - 7 x weekly - 1 sets - 3 reps - 30 seconds  hold    ASSESSMENT:  CLINICAL IMPRESSION:  Continued to work on functional postural training, thoracic mobility, core strengthening as able today. Also continued to encourage exercise as appropriate at the gym, discussed target HR zones for exercise and HR response to exercise. Still a bit self-limiting, will continue to progress and encourage as able.    Patient is a 73 y.o. M who was seen today for physical therapy evaluation and treatment for M54.42,M54.41,G89.29 (ICD-10-CM) - Chronic bilateral low back pain with bilateral sciatica. Objectives as above, he does have a significant aneurysm with possible upcoming surgery (awaiting formal vascular consult), we will need to be careful with this until medical team is able to work it up a bit more. Does have quite a bit MSK findings that we can address in PT- will progress as appropriate.   OBJECTIVE IMPAIRMENTS: decreased activity tolerance, decreased mobility, difficulty walking, decreased ROM, decreased strength, hypomobility, increased fascial restrictions, increased muscle spasms, impaired flexibility, improper body mechanics, postural dysfunction, and pain.   ACTIVITY LIMITATIONS:  carrying, lifting, standing, and locomotion level  PARTICIPATION LIMITATIONS: driving, shopping, community activity, and yard work  PERSONAL FACTORS: Age, Fitness, and Time since onset of injury/illness/exacerbation are also affecting patient's functional outcome.   REHAB POTENTIAL: Fair multiple medical co-morbidities influencing health   CLINICAL DECISION MAKING: Evolving/moderate complexity  EVALUATION COMPLEXITY: Moderate  GOALS: Goals reviewed with patient? No  SHORT TERM GOALS: Target date: 12/26/2023    Will be compliant with appropriate progressive HEP  Baseline: Goal status: progressing 12/22/23  2.  Will demonstrate good biomechanics for bed mobility and floor to waist transfers  Baseline:  Goal status: ONGOING 01/03/24  3.  Will be able to verbalize basic precautions to avoid damaging known aneurysm and principles of progressions on reclining bike  Baseline:  Goal status: ONGOING 01/03/24   LONG TERM GOALS: Target date: 01/16/2024    Pain to be no more than 4/10 at worst  Baseline:  Goal status: INITIAL  2.  Will be able to stand for at least 45 minutes without increase in back pain or radicular sx  Baseline:  Goal status: INITIAL  3.  Will be able to walk for at least 45 minutes without increase in back pain or radicular symptoms  Baseline:  Goal status: INITIAL  4.  Lumbar and BLE flexibility impairments to be no more than 25% limited  Baseline:  Goal status: INITIAL  5.  Will tolerate regular gym based exercise program safe for known aneurysm without increase in pain  Baseline:  Goal status: INITIAL  6.  PSFS score to have improved by 2 points  Baseline:  Goal status: INITIAL  PLAN:  PT FREQUENCY: 2x/week  PT DURATION: 6 weeks  PLANNED INTERVENTIONS: 97750- Physical Performance Testing, 97110-Therapeutic exercises, 97530- Therapeutic activity, [id number]- Neuromuscular re-education, 97535- Self Care, [id number]- Manual therapy, [id number]- Aquatic Therapy,  Taping, and Dry Needling.  PLAN FOR NEXT SESSION: caution with known aneurysm- avoid valgus maneuver and heavy resistance training for now- careful core strengthening, biomechanics, LE and back mobility/flexibility, postural training. Continue to encourage cardio at the gym while avoiding high intra-abdominal pressures with exercise    Terrel Ferries, PT, DPT 01/03/24 10:12 AM

## 2024-01-04 ENCOUNTER — Encounter

## 2024-01-05 ENCOUNTER — Ambulatory Visit: Admitting: Physical Therapy

## 2024-01-05 DIAGNOSIS — R29898 Other symptoms and signs involving the musculoskeletal system: Secondary | ICD-10-CM | POA: Diagnosis not present

## 2024-01-05 DIAGNOSIS — M5459 Other low back pain: Secondary | ICD-10-CM | POA: Diagnosis not present

## 2024-01-05 DIAGNOSIS — R262 Difficulty in walking, not elsewhere classified: Secondary | ICD-10-CM | POA: Diagnosis not present

## 2024-01-05 NOTE — Therapy (Signed)
 OUTPATIENT PHYSICAL THERAPY THORACOLUMBAR TREATMENT   Patient Name: Jackson French MRN: [id number] DOB:26-Mar-1951, 73 y.o., male Today's Date: 01/05/2024  END OF SESSION:  PT End of Session - 01/05/24 0829     Visit Number 6    Number of Visits 13    Date for PT Re-Evaluation 01/30/24    Authorization Type MCR and Mutual    Authorization Time Period 12/05/23 to 01/30/24    PT Start Time 0830    PT Stop Time 0915    PT Time Calculation (min) 45 min               Past Medical History:  Diagnosis Date   Allergy    Cataract removed IOLs ou   GERD (gastroesophageal reflux disease) treated by gastro   Gout    Hyperlipidemia    Hypertension    Sleep apnea    no CPAP   Thyroid  disease    Past Surgical History:  Procedure Laterality Date   APPENDECTOMY     CATARACT EXTRACTION, BILATERAL Bilateral    COLONOSCOPY  04/19/2022   EYE SURGERY     FRACTURE SURGERY     HYDROCELE EXCISION / REPAIR     THYROID  SURGERY     TONSILLECTOMY     VASECTOMY     Patient Active Problem List   Diagnosis Date Noted   Elevated PSA 01/18/2018   Chronic constipation 03/07/2017   Dyslipidemia 03/07/2017   Hypothyroidism 03/07/2017   History of gout 03/07/2017   OSA (obstructive sleep apnea) 03/07/2017   Essential hypertension 05/17/2016   Severe obesity (BMI 35.0-35.9 with comorbidity) (HCC) 05/17/2016   Umbilical hernia without obstruction and without gangrene 12/23/2014    PCP: Francine Iron   REFERRING PROVIDER: Syliva Even, MD  REFERRING DIAG: M54.42,M54.41,G89.29 (ICD-10-CM) - Chronic bilateral low back pain with bilateral sciatica  Rationale for Evaluation and Treatment: Rehabilitation  THERAPY DIAG:  Other low back pain  Difficulty in walking, not elsewhere classified  Other symptoms and signs involving the musculoskeletal system  ONSET DATE: aneurysm dx-ed August 2024  SUBJECTIVE:                                                                                                                                                                                            SUBJECTIVE STATEMENT:  feeling good, went to gym yesterday and did Nustep and walked. Tightened up with walking     I've been having some issues with my back but also have an aneurysm that I have some appts for in May. Seeing vascular surgeon at the end of May. I had been trying to  work out- walking and [employer]- and then got the news about the aneurysm and stopped exercising, gained weight. Back feels muscular to me. I do have a neuralgia in my hip when I sleep, it gets numb in the upper part of my thigh and have gotten some snapping/popping in my left hip no pain. My big concern is the weight gain, I feel like I don't know what to do in terms of exercising with the aneurysm.   PERTINENT HISTORY:  See above   PAIN:  Are you having pain? No 0/10 now  PRECAUTIONS: Other: known aneurysm with possible upcoming surgical intervention (TBD)- avoid high inter-abdominal pressures/valgus   RED FLAGS: None   WEIGHT BEARING RESTRICTIONS: No  FALLS:  Has patient fallen in last 6 months? No, no FOF   LIVING ENVIRONMENT: Lives with: lives with their spouse Lives in: House/apartment   OCCUPATION: retired- optometrist   PLOF: Independent, Independent with basic ADLs, Independent with gait, and Independent with transfers  PATIENT GOALS: figure out how to exercise safely given aneurysm, lose weight, address back pain and be able to walk/exercise more  NEXT MD VISIT: referring June 5th  OBJECTIVE:  Note: Objective measures were completed at Evaluation unless otherwise noted.  DIAGNOSTIC FINDINGS:   IMPRESSION: 1. Stable aneurysmal disease of the ascending thoracic aorta measuring approximately 4.6-4.7 cm in greatest diameter. Ascending thoracic aortic aneurysm. Recommend semi-annual imaging followup by CTA or MRA and referral to cardiothoracic surgery if not already obtained.  This recommendation follows 2010 ACCF/AHA/AATS/ACR/ASA/SCA/SCAI/SIR/STS/SVM Guidelines for the Diagnosis and Management of Patients With Thoracic Aortic Disease. Circulation. 2010; 121: Z610-R604. Aortic aneurysm NOS (ICD10-I71.9) 2. Stable dilatation of the aortic root measuring approximately 4.5-4.7 cm at the level of the sinuses of Valsalva. 3. Stable mild calcified coronary artery plaque.   Aortic aneurysm NOS (ICD10-I71.9).  CLINICAL DATA:  low back pain   EXAM: LUMBAR SPINE - 3 VIEW   COMPARISON:  11/17/2021.   FINDINGS: No fracture, dislocation or subluxation. Grade 1 L5 retrolisthesis. No osteolytic or osteoblastic changes.   Degenerative disc disease noted with disc space narrowing and marginal osteophytes at T12-L1, L1-L2 and L5-S1. Facet joint degenerative changes with sclerosis and osteophytes L4-5 and L5-S1.   IMPRESSION: Degenerative changes. No acute osseous abnormalities.  PATIENT SURVEYS:   Patient-Specific Activity Scoring Scheme  "0" represents "unable to perform." "10" represents "able to perform at prior level. 0 1 2 3 4 5 6 7 8 9  10 (Date and Score)   Activity Eval   01/03/24  1. Standing for extended periods  6   7  2. Walking for extended periods   3  6  3. Exercising in context of aneurysm  2 5  4.    5.    Score 3.7 6   Total score = sum of the activity scores/number of activities Minimum detectable change (90%CI) for average score = 2 points Minimum detectable change (90%CI) for single activity score = 3 points      COGNITION: Overall cognitive status: Within functional limits for tasks assessed     SENSATION: None now, gets some numbness into L thigh when laying on L side (after about 30 minutes)- sleeping on stomach resolves this   MUSCLE LENGTH:  Hip flexors severe limitation B per functional observation  HS severe limitation B Piriformis severe limitation B   POSTURE: rounded shoulders, forward head, decreased lumbar  lordosis, and increased thoracic kyphosis  PALPATION: Lumbar/thoracic paraspinals very tight but not tender   LUMBAR ROM:  AROM eval  Flexion WNL but very tight HS   Extension Severe limitation REIS no change   Right lateral flexion Mod limitation   Left lateral flexion Mod limitation   Right rotation Mod limitation   Left rotation Mod limitation    (Blank rows = not tested)    LOWER EXTREMITY MMT:    MMT Right eval Left eval  Hip flexion 5 5  Hip extension    Hip abduction 5 5  Hip adduction    Hip internal rotation    Hip external rotation    Knee flexion 5 5  Knee extension 5 5  Ankle dorsiflexion    Ankle plantarflexion    Ankle inversion    Ankle eversion      LUMBAR SPECIAL TESTS:  Straight leg raise test: Negative    TREATMENT DATE:   01/05/24 Nustep L 5 8 min ( while on Nustep discussed and educ on gym equipment and settings for Nustep and Bike as well on starting slow and progressing vs overdoing and setting himself back and he VU but did state he struggles with overdoing esp as he is not hurting like he was.Discussed getting use to cardio equipment then we can start to incorporate in wt machine with instructions) Bike L 4 4 min- rec recumbant vs stationary for increased support and less strain Resisted trunk flex and ext 2 sets 10 Seated row 20# 2 sets 10 Lat pull 20# 2 sets 10   01/03/24  PSFS 6 Nustep L5x8 minutes all four extremities seat 12   Continued to encourage exercises at gym that do not increase intra-abdominal pressures, encouraged Nustep and reclining bike. Discussed age predicted HR zones and target HR for exercise (88-117bpm for 60-80% of HRmax for his age), as well as typical vs problematic HR response in context of his known bradycardia and known benefit of cardio in managing elevated BP (lower BP beneficial for reducing stress on his aneurysm)  Wall Ts x15 0# Wall Ys x15 0# Wall Is + scap retraction x15  0# Thoracic ROM all  directions x15-20 Biceps/pec stretch at wall 2x30 seconds B  Corner pec stretch 2x30 seconds  UBE backwards only L3/seat 8 x5 min for periscap/posterior shoulder strength    12/28/23  Education on what to avoid in context of aneurysm in terms of exercising at the gym and what is just fine for him to do, encouraged speaking with exercises with registered dietician as well when he sees her next week, encouraged reclining bikes but avoiding valgus maneuver/resistance training at gym. Elevated HR with exercise is OK as long as intra-abdominal pressure is not increasing   Supine hip flexor stretch 3x60 seconds B Standing trunk extensions against wall 10x10 second holds for upright posture  Upright against wall (back of head touching) scap retractions red TB 15x2 seconds  Thoracic extensions x15  Pec stretch in doorway 3x30 seconds   Nustep L5x8 minutes seat 12 all four extremities    12/26/23 Nustep L 5 7 min 10# cable pulleys shld ext (10#) and rows (15#)2 sets 10 Cable pulley upright row into trunk ext 2 sets 10 15# 8# farmer carry 150 feet in each hand-cued to stand up tall STS with 5# wt chest press 10 x Black tband trunk flex and ext 20 x Feet on ball bridge, KTC ,obl and iso abdominals Clams DL 10 x then SL 10 x green tband Hip flexion supine green tband 20 x SLR 10 xBIL SLR with abd 10 x PROM  and stretching LE and trunk Seated active HS stretching and calf stretching   12/22/23 Nustep L 4 10# cable pulleys shld ext and rows 2 sets 10 20# resisted gait 4 way 5 x each Feet on ball at knees bridge 10x,KTC and obl 10 each.iso abdominals 10 x ADD ball squeeze 10 x then 10x with low bridge Clams DL 10 x then SL 10 x green tband Hip flexion supine green tband 20 x PROM and stretching LE and trunk Standing hip ext and abd 10 x BIL red tband Wt ball trunk ext at wall 10 x    12/05/23  Exam, POC, HEP                                                                                                                                     PATIENT EDUCATION:  Education details: exam findings, POC, HEP, general exercise precautions with aneurysm, recommended reclining bike or Nustep at the gym as this may be more tolerable than walking program  Person educated: Patient Education method: Explanation and Demonstration Education comprehension: verbalized understanding, returned demonstration, and needs further education  HOME EXERCISE PROGRAM:  Access Code: [email or web address] URL: [email or web address] Date: 01/05/2024 Prepared by: Shelvy Dickens Thana Ramp  Exercises - Standing Hip Extension with Counter Support  - 1 x daily - 7 x weekly - 2 sets - 10 reps - Standing Hip Abduction with Counter Support  - 1 x daily - 7 x weekly - 2 sets - 10 reps - Standing March with Counter Support  - 1 x daily - 7 x weekly - 2 sets - 10 reps - Sit to Stand  - 1 x daily - 7 x weekly - 1 sets - 10 reps - Standing Shoulder Extension with Resistance  - 1 x daily - 7 x weekly - 2 sets - 10 reps - Standing Shoulder Row with Anchored Resistance  - 1 x daily - 7 x weekly - 2 sets - 10 reps  Access Code: [email or web address] URL: [email or web address] Date: 12/05/2023 Prepared by: Terrel Ferries  Exercises - Supine Transversus Abdominis Bracing - Hands on Stomach  - 3 x daily - 7 x weekly - 1 sets - 10 reps - 1 second  hold - Hooklying Hamstring Stretch with Strap  - 2 x daily - 7 x weekly - 1 sets - 3 reps - 30 seconds  hold - Supine Figure 4 Piriformis Stretch  - 2 x daily - 7 x weekly - 1 sets - 3 reps - 30 seconds  hold - Modified Thomas Stretch  - 2 x daily - 7 x weekly - 1 sets - 3 reps - 30 seconds  hold    ASSESSMENT:  CLINICAL IMPRESSION: pt arrived without pain, feeling better. Still amb with fwd flexed trunk and head,anterior lean.while on Nustep discussed and educ on gym equipment and settings for Nustep and Bike as well on starting slow and progressing vs  overdoing and  setting himself back and he VU but did state he struggles with overdoing esp as he is not hurting like he was.Discussed getting use to cardio equipment then we can start to incorporate in wt machine with instructions) Initaited resisted wt machines today ,keeping load below 30# and cuing for breathing to decrease strain. Progressed HEP   Patient is a 73 y.o. M who was seen today for physical therapy evaluation and treatment for M54.42,M54.41,G89.29 (ICD-10-CM) - Chronic bilateral low back pain with bilateral sciatica. Objectives as above, he does have a significant aneurysm with possible upcoming surgery (awaiting formal vascular consult), we will need to be careful with this until medical team is able to work it up a bit more. Does have quite a bit MSK findings that we can address in PT- will progress as appropriate.   OBJECTIVE IMPAIRMENTS: decreased activity tolerance, decreased mobility, difficulty walking, decreased ROM, decreased strength, hypomobility, increased fascial restrictions, increased muscle spasms, impaired flexibility, improper body mechanics, postural dysfunction, and pain.   ACTIVITY LIMITATIONS: carrying, lifting, standing, and locomotion level  PARTICIPATION LIMITATIONS: driving, shopping, community activity, and yard work  PERSONAL FACTORS: Age, Fitness, and Time since onset of injury/illness/exacerbation are also affecting patient's functional outcome.   REHAB POTENTIAL: Fair multiple medical co-morbidities influencing health   CLINICAL DECISION MAKING: Evolving/moderate complexity  EVALUATION COMPLEXITY: Moderate   GOALS: Goals reviewed with patient? No  SHORT TERM GOALS: Target date: 12/26/2023    Will be compliant with appropriate progressive HEP  Baseline: Goal status: progressing 12/22/23  2.  Will demonstrate good biomechanics for bed mobility and floor to waist transfers  Baseline:  Goal status: ONGOING 01/03/24  3.  Will be able to verbalize basic  precautions to avoid damaging known aneurysm and principles of progressions on reclining bike  Baseline:  Goal status: ONGOING 01/03/24   LONG TERM GOALS: Target date: 01/16/2024    Pain to be no more than 4/10 at worst  Baseline:  Goal status: INITIAL  2.  Will be able to stand for at least 45 minutes without increase in back pain or radicular sx  Baseline:  Goal status: INITIAL  3.  Will be able to walk for at least 45 minutes without increase in back pain or radicular symptoms  Baseline:  Goal status: INITIAL  4.  Lumbar and BLE flexibility impairments to be no more than 25% limited  Baseline:  Goal status: INITIAL  5.  Will tolerate regular gym based exercise program safe for known aneurysm without increase in pain  Baseline:  Goal status: INITIAL  6.  PSFS score to have improved by 2 points  Baseline:  Goal status: INITIAL  PLAN:  PT FREQUENCY: 2x/week  PT DURATION: 6 weeks  PLANNED INTERVENTIONS: 97750- Physical Performance Testing, 97110-Therapeutic exercises, 97530- Therapeutic activity, [id number]- Neuromuscular re-education, 97535- Self Care, 16109- Manual therapy, [id number]- Aquatic Therapy, Taping, and Dry Needling.  PLAN FOR NEXT SESSION: assess response to session today. Assess how cardio at gym is going and add wt machines to handout for gym when safe.Continue to encourage cardio at the gym while avoiding high intra-abdominal pressures with exercise   Patient Details  Name: Chrishon Martino MRN: [id number] Date of Birth: 1951-01-17 Referring Provider:  Syliva Even, MD  Encounter Date: 01/05/2024   Aquilla Bayley, PTA 01/05/2024, 8:30 AM  [facility]. [place], [place], [address] Phone: [phone]   Fax:  [phone]

## 2024-01-09 ENCOUNTER — Other Ambulatory Visit: Payer: Self-pay | Admitting: Cardiology

## 2024-01-09 ENCOUNTER — Ambulatory Visit: Payer: Self-pay | Admitting: Cardiology

## 2024-01-09 ENCOUNTER — Ambulatory Visit (HOSPITAL_BASED_OUTPATIENT_CLINIC_OR_DEPARTMENT_OTHER)
Admission: RE | Admit: 2024-01-09 | Discharge: 2024-01-09 | Disposition: A | Discharge status: Home / Self Care | Source: Ambulatory Visit | Attending: Cardiology | Admitting: Cardiology

## 2024-01-09 ENCOUNTER — Encounter

## 2024-01-09 DIAGNOSIS — R072 Precordial pain: Secondary | ICD-10-CM

## 2024-01-09 DIAGNOSIS — Z8489 Family history of other specified conditions: Secondary | ICD-10-CM | POA: Insufficient documentation

## 2024-01-09 DIAGNOSIS — I2510 Atherosclerotic heart disease of native coronary artery without angina pectoris: Secondary | ICD-10-CM

## 2024-01-09 DIAGNOSIS — Z136 Encounter for screening for cardiovascular disorders: Secondary | ICD-10-CM

## 2024-01-09 DIAGNOSIS — I10 Essential (primary) hypertension: Secondary | ICD-10-CM

## 2024-01-09 DIAGNOSIS — R0989 Other specified symptoms and signs involving the circulatory and respiratory systems: Secondary | ICD-10-CM

## 2024-01-09 DIAGNOSIS — I77810 Thoracic aortic ectasia: Secondary | ICD-10-CM

## 2024-01-09 DIAGNOSIS — Z87891 Personal history of nicotine dependence: Secondary | ICD-10-CM | POA: Diagnosis not present

## 2024-01-10 NOTE — Progress Notes (Unsigned)
   Joanna Muck, PhD, LAT, ATC acting as a scribe for Garlan Juniper, MD.  Emitt Maglione is a 73 y.o. male who presents to [facility] today for LBP, bilat LE weakness, and lumbar spinal stenosis. Pt was last seen by Dr. Alease Hunter on 11/14/23 and a ABI was ordered and he was referred to PT, completing 6 visits.  Today, pt reports he is feeling a lot better. He uses caution when lifting objects. He has been able to return to working out at [employer] and going for walks. He has 1 more PT visit scheduled.   Dx testing: 12/12/23 Vasc US  ABI  11/14/23 L-spine XR 11/17/21 L-spine XR   Pertinent review of systems: No fevers or chills  Relevant historical information: Hypertension sleep apnea obesity.   Exam:  BP 122/80   Pulse 84   Ht 5' 11.5" (1.816 m)   Wt (!) 301 lb (136.5 kg)   SpO2 98%   BMI 41.40 kg/m  General: Well Developed, well nourished, and in no acute distress.   MSK: Spine decreased lumbar motion normal gait.    Lab and Radiology Results No results found for this or any previous visit (from the past 72 hours). US  AORTA MEDICARE SCREENING Result Date: 01/09/2024 CLINICAL DATA:  Medicare screening. Previous CT a chest 09/2023. History of tobacco abuse. EXAM: US  ABDOMINAL AORTA MEDICARE SCREENING TECHNIQUE: Ultrasound examination of the abdominal aorta was performed as a screening evaluation for abdominal aortic aneurysm. COMPARISON:  None Available. FINDINGS: Abdominal aortic measurements as follows: Proximal:  2.8 cm by 2.9 cm cm Mid:  2.1 cm x 1.8 cm cm Distal:  2.0 cm x 1.9 cm cm IMPRESSION: The visualized abdominal aorta measures 2.8 cm x 2.9 cm in greatest dimension. Electronically Signed   By: Susan Ensign   On: 01/09/2024 10:46       Assessment and Plan: 73 y.o. male with chronic back pain and radiculopathy consistent with spinal stenosis.  Doing great with physical therapy.  Plan to finish out last physical therapy appointment and continue  home exercise program.  Next step if needed would be MRI.  Check back with me as needed.   PDMP not reviewed this encounter. No orders of the defined types were placed in this encounter.  No orders of the defined types were placed in this encounter.    Discussed warning signs or symptoms. Please see discharge instructions. Patient expresses understanding.  The above documentation has been reviewed and is accurate and complete Garlan Juniper, M.D.  .

## 2024-01-11 ENCOUNTER — Ambulatory Visit (INDEPENDENT_AMBULATORY_CARE_PROVIDER_SITE_OTHER): Admitting: Family Medicine

## 2024-01-11 VITALS — BP 122/80 | HR 84 | Ht 71.5 in | Wt 301.0 lb

## 2024-01-11 DIAGNOSIS — G8929 Other chronic pain: Secondary | ICD-10-CM | POA: Diagnosis not present

## 2024-01-11 DIAGNOSIS — M5442 Lumbago with sciatica, left side: Secondary | ICD-10-CM | POA: Diagnosis not present

## 2024-01-11 DIAGNOSIS — M5441 Lumbago with sciatica, right side: Secondary | ICD-10-CM | POA: Diagnosis not present

## 2024-01-11 NOTE — Patient Instructions (Signed)
 Thank you for coming in today.

## 2024-01-17 ENCOUNTER — Ambulatory Visit: Attending: Family Medicine | Admitting: Physical Therapy

## 2024-01-17 ENCOUNTER — Encounter: Payer: Self-pay | Admitting: Physical Therapy

## 2024-01-17 DIAGNOSIS — M5459 Other low back pain: Secondary | ICD-10-CM | POA: Insufficient documentation

## 2024-01-17 DIAGNOSIS — R29898 Other symptoms and signs involving the musculoskeletal system: Secondary | ICD-10-CM | POA: Diagnosis not present

## 2024-01-17 DIAGNOSIS — R262 Difficulty in walking, not elsewhere classified: Secondary | ICD-10-CM | POA: Insufficient documentation

## 2024-01-17 NOTE — Therapy (Signed)
 OUTPATIENT PHYSICAL THERAPY THORACOLUMBAR DISCHARGE    Patient Name: Jackson French MRN: [id number] DOB:1951/05/01, 73 y.o., male Today's Date: 01/17/2024   PHYSICAL THERAPY DISCHARGE SUMMARY  Visits from Start of Care: 7  Current functional level related to goals / functional outcomes: See below    Remaining deficits: See below    Education / Equipment: See below    Patient agrees to discharge. Patient goals were partially met. Patient is being discharged due to the patient's request.   END OF SESSION:  PT End of Session - 01/17/24 1215     Visit Number 7    Number of Visits 7    Authorization Type M[employer] and [employer]    Authorization Time Period 12/05/23 to 01/30/24    Progress Note Due on Visit 10    PT Start Time 1150    PT Stop Time 1220   DC no need for further skilled PT services   PT Time Calculation (min) 30 min    Activity Tolerance Patient tolerated treatment well    Behavior During Therapy WFL for tasks assessed/performed                Past Medical History:  Diagnosis Date   Allergy    Cataract removed IOLs ou   GERD (gastroesophageal reflux disease) treated by gastro   Gout    Hyperlipidemia    Hypertension    Sleep apnea    no CPAP   Thyroid  disease    Past Surgical History:  Procedure Laterality Date   APPENDECTOMY     CATARACT EXTRACTION, BILATERAL Bilateral    COLONOSCOPY  04/19/2022   EYE SURGERY     FRACTURE SURGERY     HYDROCELE EXCISION / REPAIR     THYROID  SURGERY     TONSILLECTOMY     VASECTOMY     Patient Active Problem List   Diagnosis Date Noted   Elevated PSA 01/18/2018   Chronic constipation 03/07/2017   Dyslipidemia 03/07/2017   Hypothyroidism 03/07/2017   History of gout 03/07/2017   OSA (obstructive sleep apnea) 03/07/2017   Essential hypertension 05/17/2016   Severe obesity (BMI 35.0-35.9 with comorbidity) (HCC) 05/17/2016   Umbilical hernia without obstruction and without gangrene 12/23/2014    PCP:  Francine Iron   REFERRING PROVIDER: Syliva Even, MD  REFERRING DIAG: M54.42,M54.41,G89.29 (ICD-10-CM) - Chronic bilateral low back pain with bilateral sciatica  Rationale for Evaluation and Treatment: Rehabilitation  THERAPY DIAG:  Other low back pain  Difficulty in walking, not elsewhere classified  Other symptoms and signs involving the musculoskeletal system  ONSET DATE: aneurysm dx-ed August 2024  SUBJECTIVE:  SUBJECTIVE STATEMENT:   Not much is new, saw Dr. Alease Hunter and he was OK with how I'm doing.Told me to just finish last PT visit. If I bend over or try to lift too much, back hurts. Standing and walking and not un-natural things don't bother me.      EVAL: I've been having some issues with my back but also have an aneurysm that I have some appts for in May. Seeing vascular surgeon at the end of May. I had been trying to work out- walking and weights- and then got the news about the aneurysm and stopped exercising, gained weight. Back feels muscular to me. I do have a neuralgia in my hip when I sleep, it gets numb in the upper part of my thigh and have gotten some snapping/popping in my left hip no pain. My big concern is the weight gain, I feel like I don't know what to do in terms of exercising with the aneurysm.   PERTINENT HISTORY:  See above   PAIN:  Are you having pain? No 0/10 now, can get to 4-5/10 at worst but goes away with seated rest   PRECAUTIONS: Other: known aneurysm with possible upcoming surgical intervention (TBD)- avoid high inter-abdominal pressures/valgus   RED FLAGS: None   WEIGHT BEARING RESTRICTIONS: No  FALLS:  Has patient fallen in last 6 months? No, no FOF   LIVING ENVIRONMENT: Lives with: lives with their spouse Lives in:  House/apartment   OCCUPATION: retired- optometrist   PLOF: Independent, Independent with basic ADLs, Independent with gait, and Independent with transfers  PATIENT GOALS: figure out how to exercise safely given aneurysm, lose weight, address back pain and be able to walk/exercise more  NEXT MD VISIT: referring June 5th  OBJECTIVE:  Note: Objective measures were completed at Evaluation unless otherwise noted.  DIAGNOSTIC FINDINGS:   IMPRESSION: 1. Stable aneurysmal disease of the ascending thoracic aorta measuring approximately 4.6-4.7 cm in greatest diameter. Ascending thoracic aortic aneurysm. Recommend semi-annual imaging followup by CTA or MRA and referral to cardiothoracic surgery if not already obtained. This recommendation follows 2010 ACCF/AHA/AATS/ACR/ASA/SCA/SCAI/SIR/STS/SVM Guidelines for the Diagnosis and Management of Patients With Thoracic Aortic Disease. Circulation. 2010; 121: Z610-R604. Aortic aneurysm NOS (ICD10-I71.9) 2. Stable dilatation of the aortic root measuring approximately 4.5-4.7 cm at the level of the sinuses of Valsalva. 3. Stable mild calcified coronary artery plaque.   Aortic aneurysm NOS (ICD10-I71.9).  CLINICAL DATA:  low back pain   EXAM: LUMBAR SPINE - 3 VIEW   COMPARISON:  11/17/2021.   FINDINGS: No fracture, dislocation or subluxation. Grade 1 L5 retrolisthesis. No osteolytic or osteoblastic changes.   Degenerative disc disease noted with disc space narrowing and marginal osteophytes at T12-L1, L1-L2 and L5-S1. Facet joint degenerative changes with sclerosis and osteophytes L4-5 and L5-S1.   IMPRESSION: Degenerative changes. No acute osseous abnormalities.  PATIENT SURVEYS:   Patient-Specific Activity Scoring Scheme  0 represents "unable to perform." 10 represents "able to perform at prior level. 0 1 2 3 4 5 6 7 8 9  10 (Date and Score)   Activity Eval   01/03/24 DC 01/17/24  1. Standing for extended periods  6   7 8   2.  Walking for extended periods   3  6 9   3. Exercising in context of aneurysm  2 5 8   4.     5.     Score 3.7 6 8.3   Total score = sum of the activity scores/number of activities Minimum detectable change (90%CI)  for average score = 2 points Minimum detectable change (90%CI) for single activity score = 3 points      COGNITION: Overall cognitive status: Within functional limits for tasks assessed     SENSATION: None now, gets some numbness into L thigh when laying on L side (after about 30 minutes)- sleeping on stomach resolves this   MUSCLE LENGTH:  Hip flexors severe limitation B per functional observation; 01/17/24- no change since eval  HS severe limitation B;01/17/24- Mild limitation L, Mod limitation R Piriformis severe limitation B; 01/17/24 Mild limitation L, Mod limitation R  POSTURE: rounded shoulders, forward head, decreased lumbar lordosis, and increased thoracic kyphosis  PALPATION: Lumbar/thoracic paraspinals very tight but not tender   LUMBAR ROM:   AROM eval DC   Flexion WNL but very tight HS  WNL   Extension Severe limitation REIS no change  Mild limitation  Right lateral flexion Mod limitation  WNL   Left lateral flexion Mod limitation  WNL   Right rotation Mod limitation  Mild limitation  Left rotation Mod limitation  Mild limitation   (Blank rows = not tested)    LOWER EXTREMITY MMT:    MMT Right eval Left eval  Hip flexion 5 5  Hip extension    Hip abduction 5 5  Hip adduction    Hip internal rotation    Hip external rotation    Knee flexion 5 5  Knee extension 5 5  Ankle dorsiflexion    Ankle plantarflexion    Ankle inversion    Ankle eversion      LUMBAR SPECIAL TESTS:  Straight leg raise test: Negative    TREATMENT DATE:   01/17/24  PSFS, lumbar ROM and hip flexibility check, goal review, DC today, HEP additions  Nustep L5x8 all four extremities x8 minutes      01/05/24 Nustep L 5 8 min ( while on Nustep discussed and  educ on gym equipment and settings for [employer] as well on starting slow and progressing vs overdoing and setting himself back and he VU but did state he struggles with overdoing esp as he is not hurting like he was.Discussed getting use to cardio equipment then we can start to incorporate in wt machine with instructions) Bike L 4 4 min- rec recumbant vs stationary for increased support and less strain Resisted trunk flex and ext 2 sets 10 Seated row 20# 2 sets 10 Lat pull 20# 2 sets 10   01/03/24  PSFS 6 Nustep L5x8 minutes all four extremities seat 12   Continued to encourage exercises at gym that do not increase intra-abdominal pressures, encouraged Nustep and reclining bike. Discussed age predicted HR zones and target HR for exercise (88-117bpm for 60-80% of HRmax for his age), as well as typical vs problematic HR response in context of his known bradycardia and known benefit of cardio in managing elevated BP (lower BP beneficial for reducing stress on his aneurysm)  Wall Ts x15 0# Wall Ys x15 0# Wall Is + scap retraction x15  0# Thoracic ROM all directions x15-20 Biceps/pec stretch at wall 2x30 seconds B  Corner pec stretch 2x30 seconds  UBE backwards only L3/seat 8 x5 min for periscap/posterior shoulder strength  PATIENT EDUCATION:  Education details: exam findings, POC, HEP, general exercise precautions with aneurysm, recommended reclining bike or Nustep at the gym as this may be more tolerable than walking program  Person educated: Patient Education method: Explanation and Demonstration Education comprehension: verbalized understanding, returned demonstration, and needs further education  HOME EXERCISE PROGRAM:  Access Code: [email or web address] URL: [email or web address] Date: 01/05/2024 Prepared by: Shelvy Dickens  Payseur  Exercises - Standing Hip Extension with Counter Support  - 1 x daily - 7 x weekly - 2 sets - 10 reps - Standing Hip Abduction with Counter Support  - 1 x daily - 7 x weekly - 2 sets - 10 reps - Standing March with Counter Support  - 1 x daily - 7 x weekly - 2 sets - 10 reps - Sit to Stand  - 1 x daily - 7 x weekly - 1 sets - 10 reps - Standing Shoulder Extension with Resistance  - 1 x daily - 7 x weekly - 2 sets - 10 reps - Standing Shoulder Row with Anchored Resistance  - 1 x daily - 7 x weekly - 2 sets - 10 reps  Access Code: [email or web address] URL: [email or web address] Date: 01/17/2024 Prepared by: Terrel Ferries  Exercises - Supine Transversus Abdominis Bracing - Hands on Stomach  - 3 x daily - 7 x weekly - 1 sets - 10 reps - 1 second  hold - Hooklying Hamstring Stretch with Strap  - 2 x daily - 7 x weekly - 1 sets - 3 reps - 30 seconds  hold - Supine Figure 4 Piriformis Stretch  - 2 x daily - 7 x weekly - 1 sets - 3 reps - 30 seconds  hold - Modified Thomas Stretch  - 2 x daily - 7 x weekly - 1 sets - 3 reps - 30 seconds  hold - Wall Angels  - 1 x daily - 7 x weekly - 1-2 sets - 10 reps - Standing Hip Flexor Stretch  - 1 x daily - 7 x weekly - 1-2 sets - 4 reps - 30 seconds hold    ASSESSMENT:  CLINICAL IMPRESSION:   Arrived for final DC check- has partially met most of his goals, he and referring MD are happy with progress at this point. DC today per his request.   Patient is a 73 y.o. M who was seen today for physical therapy evaluation and treatment for M54.42,M54.41,G89.29 (ICD-10-CM) - Chronic bilateral low back pain with bilateral sciatica. Objectives as above, he does have a significant aneurysm with possible upcoming surgery (awaiting formal vascular consult), we will need to be careful with this until medical team is able to work it up a bit more. Does have quite a bit MSK findings that we can address in PT- will progress as appropriate.   OBJECTIVE  IMPAIRMENTS: decreased activity tolerance, decreased mobility, difficulty walking, decreased ROM, decreased strength, hypomobility, increased fascial restrictions, increased muscle spasms, impaired flexibility, improper body mechanics, postural dysfunction, and pain.   ACTIVITY LIMITATIONS: carrying, lifting, standing, and locomotion level  PARTICIPATION LIMITATIONS: driving, shopping, community activity, and yard work  PERSONAL FACTORS: Age, Fitness, and Time since onset of injury/illness/exacerbation are also affecting patient's functional outcome.   REHAB POTENTIAL: Fair multiple medical co-morbidities influencing health   CLINICAL DECISION MAKING: Evolving/moderate complexity  EVALUATION COMPLEXITY: Moderate   GOALS: Goals reviewed with patient? No  SHORT TERM GOALS: Target date: 12/26/2023    Will be compliant with appropriate progressive HEP  Baseline: Goal status: PARTIALLY MET 01/17/24  depends on if he has grandkids at the house or not   2.  Will demonstrate good biomechanics for bed mobility and floor to waist transfers  Baseline:  Goal status:  PARTIALLY MET 01/17/24 poor lifting mechanics, bed mobility OK   3.  Will be able to verbalize basic precautions to avoid damaging known aneurysm and principles of progressions on reclining bike  Baseline:  Goal status: PARTIALLY MET 01/17/24 despite repeated bouts of education in PT   LONG TERM GOALS: Target date: 01/16/2024    Pain to be no more than 4/10 at worst  Baseline:  Goal status: PARTIALLY MET 01/17/24 4-5/10  2.  Will be able to stand for at least 45 minutes without increase in back pain or radicular sx  Baseline:  Goal status: MET 01/17/24  3.  Will be able to walk for at least 45 minutes without increase in back pain or radicular symptoms  Baseline:  Goal status: NOT MET 01/17/24  4.  Lumbar and BLE flexibility impairments to be no more than 25% limited  Baseline:  Goal status: PARTIALLY MET 01/17/24  5.   Will tolerate regular gym based exercise program safe for known aneurysm without increase in pain  Baseline:  Goal status: NOT MET/HAS NOT STARTED 01/17/24  6.  PSFS score to have improved by 2 points  Baseline:  Goal status: 01/17/24 MET  PLAN:  PT FREQUENCY: 2x/week  PT DURATION: 6 weeks  PLANNED INTERVENTIONS: 97750- Physical Performance Testing, 97110-Therapeutic exercises, 97530- Therapeutic activity, 97112- Neuromuscular re-education, 97535- Self Care, 09811- Manual therapy, [id number]- Aquatic Therapy, Taping, and Dry Needling.  PLAN FOR NEXT SESSION: DC today   Terrel Ferries, PT, DPT 01/17/24 12:20 PM

## 2024-02-02 ENCOUNTER — Other Ambulatory Visit: Payer: Self-pay | Admitting: Medical

## 2024-02-06 ENCOUNTER — Encounter (INDEPENDENT_AMBULATORY_CARE_PROVIDER_SITE_OTHER): Payer: Self-pay

## 2024-02-07 ENCOUNTER — Encounter: Payer: Self-pay | Admitting: *Deleted

## 2024-02-13 ENCOUNTER — Encounter: Payer: Self-pay | Admitting: Medical

## 2024-02-14 NOTE — Addendum Note (Signed)
 Addended by: DORINA DALLAS HERO on: 02/14/2024 11:03 AM   Modules accepted: Orders

## 2024-02-21 ENCOUNTER — Ambulatory Visit (INDEPENDENT_AMBULATORY_CARE_PROVIDER_SITE_OTHER): Admitting: Internal Medicine

## 2024-02-21 ENCOUNTER — Encounter (INDEPENDENT_AMBULATORY_CARE_PROVIDER_SITE_OTHER): Payer: Self-pay | Admitting: Internal Medicine

## 2024-02-21 VITALS — BP 128/85 | HR 69 | Temp 98.3°F | Ht 71.5 in | Wt 298.0 lb

## 2024-02-21 DIAGNOSIS — E66813 Obesity, class 3: Secondary | ICD-10-CM | POA: Diagnosis not present

## 2024-02-21 DIAGNOSIS — I712 Thoracic aortic aneurysm, without rupture, unspecified: Secondary | ICD-10-CM | POA: Diagnosis not present

## 2024-02-21 DIAGNOSIS — R7303 Prediabetes: Secondary | ICD-10-CM | POA: Diagnosis not present

## 2024-02-21 DIAGNOSIS — R5383 Other fatigue: Secondary | ICD-10-CM | POA: Diagnosis not present

## 2024-02-21 DIAGNOSIS — Z1331 Encounter for screening for depression: Secondary | ICD-10-CM | POA: Diagnosis not present

## 2024-02-21 DIAGNOSIS — G4733 Obstructive sleep apnea (adult) (pediatric): Secondary | ICD-10-CM

## 2024-02-21 DIAGNOSIS — R0602 Shortness of breath: Secondary | ICD-10-CM | POA: Diagnosis not present

## 2024-02-21 DIAGNOSIS — Z6841 Body Mass Index (BMI) 40.0 and over, adult: Secondary | ICD-10-CM

## 2024-02-21 DIAGNOSIS — I10 Essential (primary) hypertension: Secondary | ICD-10-CM

## 2024-02-21 NOTE — Assessment & Plan Note (Signed)
 Diagnosed in August last year. Scheduled for a CT scan in early August to monitor the condition. Advised against strenuous activity but encouraged light physical activity to maintain health. - Proceed with scheduled CT scan in early August. - Blood pressure under reasonable control.  He does not smoke -He has stopped doing strenuous activity as a result. - Engage in light physical activity, avoiding strenuous exercises

## 2024-02-21 NOTE — Progress Notes (Signed)
 [address] [place],  [place], [place] [address]  Office: [phone]  /  Fax: [phone]   Subjective   Initial Visit  Jackson French (MR# [id number]) is a 73 y.o. male who presents for evaluation and treatment of obesity and related comorbidities. Current BMI is Body mass index is 40.98 kg/m. Jackson French has been struggling with his weight for many years and has been unsuccessful in either losing weight, maintaining weight loss, or reaching his healthy weight goal.  Jackson French is currently in the action stage of change and ready to dedicate time achieving and maintaining a healthier weight. Jackson French is interested in becoming our patient and working on intensive lifestyle modifications including (but not limited to) diet and exercise for weight loss.  Weight history:  When asked how their weight has affected their life and health, he states: Contributed to medical problems, Contributed to orthopedic problems or mobility issues, Having fatigue, and Having poor endurance  When asked what else they would like to accomplish? He states: Adopt a healthier eating pattern and lifestyle, Improve energy levels and physical activity, Improve existing medical conditions, and Improve quality of life  He starting to note weight gain during : adulthood.  Other contributing factors: family history of obesity, disruption of circadian rhythm / sleep disordered breathing, consumption of processed foods, reduced physical activity, and slow metabolism for age.  Their highest weight has been: 299 lbs.  Desired weight: 279  Previous weight-loss programs : [facility].  Their maximum weight loss was: 20 pounds lbs.  Their greatest challenge with dieting: meal preparation and cooking.  Current or previous pharmacotherapy: None.  Response to medication: Never tried medications   Nutritional History:  Current nutrition plan: None.  How many times do you eat outside the home: 2-4 per week  How  often do they skip meals: does not skip meals  What beverages do they drink: caffeinated beverages , juice, unsweetened tea, and occasional wine.   Use of artificial sweetners : No  Food intolerances or dislikes: none.  Food triggers: Stress, Boredom, Seeking reward, and When Sad.  Food cravings: Cheese and bread  Do they struggle with excessive hunger or portion control : Yes    Physical Activity:  Current level of physical activity: Walking 30 minutes, twice and three a week  Barriers to Exercise: time and orthopedic problems   Past medical history includes:   Past Medical History:  Diagnosis Date   Allergy    Cataract removed IOLs ou   Chronic constipation    Dyslipidemia    Elevated PSA    GERD (gastroesophageal reflux disease) treated by gastro   Gout    Hyperlipidemia    Hypertension    Hyperthyroidism    Obese    Sleep apnea    no CPAP   Thoracic aortic aneurysm (HCC)    Thyroid  disease      Objective   BP 128/85   Pulse 69   Temp 98.3 F (36.8 C)   Ht 5' 11.5 (1.816 m)   Wt 298 lb (135.2 kg)   SpO2 98%   BMI 40.98 kg/m  He was weighed on the bioimpedance scale: Body mass index is 40.98 kg/m.    Anthropometrics:  Vitals Temp: 98.3 F (36.8 C) BP: 128/85 Pulse Rate: 69 SpO2: 98 %   Anthropometric Measurements Height: 5' 11.5 (1.816 m) Weight: 298 lb (135.2 kg) BMI (Calculated): 40.99 Starting Weight: 298 lb Peak Weight: 297 lb Waist Measurement : 52 inches   Body Composition  Body Fat %: 41.5 % Fat Mass (lbs): 123.6 lbs Muscle Mass (lbs): 165.8 lbs Total Body Water (lbs): 135.4 lbs Visceral Fat Rating : 29   Other Clinical Data RMR: 2016 Fasting: yes Labs: yes Today's Visit #: 1 Starting Date: 02/21/24    Physical Exam:  General: He is overweight, cooperative, alert, well developed, and in no acute distress. PSYCH: Has normal mood, affect and thought process.   HEENT: EOMI, sclerae are anicteric. Lungs: Normal  breathing effort, no conversational dyspnea. Extremities: No edema.  Neurologic: No gross sensory or motor deficits. No tremors or fasciculations noted.    Diagnostic Data Reviewed  EKG: Normal sinus rhythm, rate 76 bpm. No conduction abnormalities, abnormal Q waves or chamber enlargement.  Indirect Calorimeter completed today shows a VO2 of 292 and a REE of 2016.  His calculated basal metabolic rate is 7565 thus his resting energy expenditure slower than calculated.  Depression Screen  Jackson French's PHQ-9 score was: 8.     02/21/2024    8:54 AM  Depression screen PHQ 2/9  Decreased Interest 3  Down, Depressed, Hopeless 0  PHQ - 2 Score 3  Altered sleeping 1  Tired, decreased energy 2  Change in appetite 1  Feeling bad or failure about yourself  0  Trouble concentrating 1  Moving slowly or fidgety/restless 0  Suicidal thoughts 0  PHQ-9 Score 8  Difficult doing work/chores Somewhat difficult    Screening for Sleep Related Breathing Disorders  Altus admits to daytime somnolence and admits to waking up still tired. Patient has a history of symptoms of daytime fatigue. Jackson French generally gets 8 or 9 hours of sleep per night, and states that he has generally restful sleep. Snoring is present. Apneic episodes are present. Epworth Sleepiness Score is 12.   BMET    Component Value Date/Time   NA 141 09/13/2022 0952   K 4.3 09/13/2022 0952   CL 104 09/13/2022 0952   CO2 25 09/13/2022 0952   GLUCOSE 95 09/13/2022 0952   BUN 17 09/13/2022 0952   CREATININE 0.98 09/13/2022 0952   CALCIUM  9.2 09/13/2022 0952   Lab Results  Component Value Date   HGBA1C 5.4 06/14/2022   HGBA1C 5.5 02/19/2018   No results found for: INSULIN  CBC    Component Value Date/Time   WBC 7.1 12/28/2022 1400   RBC 4.88 12/28/2022 1400   HGB 15.2 12/28/2022 1400   HCT 45.1 12/28/2022 1400   PLT 226.0 12/28/2022 1400   MCV 92.4 12/28/2022 1400   MCHC 33.8 12/28/2022 1400   RDW 12.7 12/28/2022 1400    Iron/TIBC/Ferritin/ %Sat No results found for: IRON, TIBC, FERRITIN, IRONPCTSAT Lipid Panel     Component Value Date/Time   CHOL 82 (L) 05/09/2023 1007   TRIG 128 05/09/2023 1007   HDL 35 (L) 05/09/2023 1007   CHOLHDL 2.3 05/09/2023 1007   CHOLHDL 3 09/13/2022 0952   VLDL 27.0 09/13/2022 0952   LDLCALC 24 05/09/2023 1007   Hepatic Function Panel     Component Value Date/Time   PROT 6.5 05/09/2023 1007   ALBUMIN 4.3 05/09/2023 1007   AST 26 05/09/2023 1007   ALT 30 05/09/2023 1007   ALKPHOS 76 05/09/2023 1007   BILITOT 0.6 05/09/2023 1007   BILIDIR 0.19 05/09/2023 1007      Component Value Date/Time   TSH 1.37 05/24/2022 0858     Assessment and Plan   TREATMENT PLAN FOR OBESITY:  Recommended Dietary Goals  Jackson French is currently in the action stage  of change. As such, his goal is to implement medically supervised obesity management plan.  He has agreed to implement: the Category 3 plan - 1500 kcal per day  Behavioral Intervention  We discussed the following Behavioral Modification Strategies today: increasing lean protein intake to established goals, decreasing simple carbohydrates , increasing vegetables, increasing lower glycemic fruits, increasing fiber rich foods, avoiding skipping meals, increasing water intake, work on meal planning and preparation, reading food labels , keeping healthy foods at home, identifying sources and decreasing liquid calories, decreasing eating out or consumption of processed foods, and making healthy choices when eating convenient foods, planning for success, and better snacking choices  Additional resources provided today: Handout on healthy eating and balanced plate, Handout on complex carbohydrates and lean sources of protein, and Category 3 packet  Recommended Physical Activity Goals  Jackson French has been advised to work up to 150 minutes of moderate intensity aerobic activity a week and strengthening exercises 2-3 times per week  for cardiovascular health, weight loss maintenance and preservation of muscle mass.   He has agreed to :  Think about enjoyable ways to increase daily physical activity and overcoming barriers to exercise and Increase physical activity in their day and reduce sedentary time (increase NEAT).  Medical Interventions and Pharmacotherapy We will work on building a Therapist, art and behavioral strategies. We will discuss the role of pharmacotherapy as an adjunct at subsequent visits.   ASSOCIATED CONDITIONS ADDRESSED TODAY  Other Fatigue Denson admits to daytime somnolence and admits to waking up still tired. Patient has a history of symptoms of daytime fatigue. Jackson French generally gets 8 or 9 hours of sleep per night, and states that he has generally restful sleep. Snoring is present. Apneic episodes are present. Epworth Sleepiness Score is 12. Jackson French does feel that his weight is causing his energy to be lower than it should be. Fatigue may be related to obesity, depression or many other causes. Labs will be ordered, and in the meanwhile, Jackson French will focus on self care including making healthy food choices, increasing physical activity and focusing on stress reduction.  Shortness of Breath Jackson French notes increasing shortness of breath with physical activity and seems to be worsening over time with weight gain. He notes getting out of breath sooner with activity than he used to. This has not gotten worse recently. Jackson French denies shortness of breath at rest or orthopnea.  Other fatigue  SOB (shortness of breath) on exertion  Depression screen  Prediabetes Assessment & Plan: Most recent A1c is  Lab Results  Component Value Date   HGBA1C 5.4 06/14/2022   HGBA1C 5.5 02/19/2018   Previous A1c of 6.  Patient aware of disease state and risk of progression. This may contribute to abnormal cravings, fatigue and diabetic complications without having diabetes.   We have discussed  treatment options which include: losing 7 to 10% of body weight, increasing physical activity to a goal of 150 minutes a week at moderate intensity.  Advised to maintain a diet low on simple and processed carbohydrates.  He may also be a candidate for pharmacoprophylaxis with metformin or incretin mimetic.  We will check fasting blood glucose, and insulin  levels.   Orders: -     Vitamin B12 -     CBC with Differential/Platelet -     Comprehensive metabolic panel with GFR -     Hemoglobin A1c -     Insulin , random -     VITAMIN D  25 Hydroxy (Vit-D Deficiency,  Fractures) -     Lipid Panel With LDL/HDL Ratio -     TSH + free T4  OSA (obstructive sleep apnea) Assessment & Plan: He reports having a history of sleep apnea with intolerance to PAP therapy and possible palate surgery.  He has a high Epworth we discussed the bidirectional relationship between sleep apnea and weight and medical conditions associated with disease.  He will think about having a repeat sleep study.  Losing 10 to 15% of body weight may reduce AHI.     Class 3 severe obesity due to excess calories with serious comorbidity and body mass index (BMI) of 40.0 to 44.9 in adult Assessment & Plan: Other contributing factors: family history of obesity, disruption of circadian rhythm / sleep disordered breathing, consumption of processed foods, reduced physical activity, and slow metabolism for age.   Start reduced calorie nutrition plan targeting 1500 cal 120 g of protein per day.  He has strong orixegenic signaling so we will see how he does with nutritional and behavioral strategies.  May benefit from antiobesity medications.  Orders: -     Vitamin B12 -     CBC with Differential/Platelet -     Comprehensive metabolic panel with GFR -     Hemoglobin A1c -     Insulin , random -     VITAMIN D  25 Hydroxy (Vit-D Deficiency, Fractures) -     Lipid Panel With LDL/HDL Ratio -     TSH + free T4  Essential  hypertension Assessment & Plan: Blood pressure at goal for age and risk category.  On losartan  100 mg without adverse effects.  Most recent renal parameters reviewed which showed normal electrolytes and kidney function.  Continue with weight loss therapy. Losing 10% may improve blood pressure control. Monitor for symptoms of orthostasis while losing weight. Continue current regimen and home monitoring for a goal blood pressure of 120/80.     Thoracic aortic aneurysm without rupture, unspecified part Jackson French) Assessment & Plan: Diagnosed in August last year. Scheduled for a CT scan in early August to monitor the condition. Advised against strenuous activity but encouraged light physical activity to maintain health. - Proceed with scheduled CT scan in early August. - Blood pressure under reasonable control.  He does not smoke -He has stopped doing strenuous activity as a result. - Engage in light physical activity, avoiding strenuous exercises    Assessment and Plan   General Health Maintenance Discussed importance of balanced diet, physical activity, and hydration. Emphasized reducing processed foods and increasing whole foods, fruits, vegetables, and lean proteins. Highlighted protein's role in boosting metabolism and preserving muscle mass. - Follow a balanced diet with a fo cus on whole foods and lean proteins. - Increase physical activity and hydration. - Avoid processed foods and added sugars.  Follow-up Scheduled to return in two weeks to review test results and discuss progress on lifestyle changes. - Schedule follow-up appointment in two weeks to review test results and discuss progress.       Follow-up  He was informed of the importance of frequent follow-up visits to maximize his success with intensive lifestyle modifications for his multiple health conditions. He was informed we would discuss his lab results at his next visit unless there is a critical issue that needs to  be addressed sooner. Jackson French agreed to keep his next visit at the agreed upon time to discuss these results.  Attestation Statement  This is the patient's intake visit at [facility].  The patient's Health Questionnaire was reviewed at length. Included in the packet: current and past health history, medications, allergies, ROS, gynecologic history (women only), surgical history, family history, social history, weight history, weight loss surgery history (for those that have had weight loss surgery), nutritional evaluation, mood and food questionnaire, PHQ9, Epworth questionnaire, sleep habits questionnaire, patient life and health improvement goals questionnaire. These will all be scanned into the patient's chart under media.   During the visit, I independently reviewed the patient's, previous labs, bioimpedance scale results, and indirect calorimetry results. I used this information to medically tailor a meal plan for the patient that will help him to lose weight and will improve his obesity-related conditions. I performed a medically necessary appropriate examination and/or evaluation. I discussed the assessment and treatment plan with the patient. The patient was provided an opportunity to ask questions and all were answered. The patient agreed with the plan and demonstrated an understanding of the instructions. Labs were ordered at this visit and will be reviewed at the next visit unless critical results need to be addressed immediately. Clinical information was updated and documented in the EMR.   In addition, they received basic education on identification of processed foods and reduction of these, different sources of lean proteins and complex carbohydrates and how to eat balanced by incorporation of whole foods.  Reviewed by clinician on day of visit: allergies, medications, problem list, medical history, surgical history, family history, social history, and previous encounter  notes.  I have spent 60 minutes in the care of the patient today including: 7 minutes before the visit reviewing and preparing the chart. 41 minutes face-to-face assessing and reviewing listed medical problems as outlined in obesity care plan, providing nutritional and behavioral counseling on topics outlined in the obesity care plan, counseling regarding anti-obesity medication as outlined in obesity care plan, independently interpreting test results and goals of care, as described in assessment and plan, reviewing and discussing biometric information and progress, and ordering diagnostics - see orders 12 minutes after the visit updating chart and documentation of encounter.       Lucas Parker, MD

## 2024-02-21 NOTE — Assessment & Plan Note (Addendum)
 Other contributing factors: family history of obesity, disruption of circadian rhythm / sleep disordered breathing, consumption of processed foods, reduced physical activity, and slow metabolism for age.   Start reduced calorie nutrition plan targeting 1500 cal 120 g of protein per day.  He has strong orixegenic signaling so we will see how he does with nutritional and behavioral strategies.  May benefit from antiobesity medications.

## 2024-02-21 NOTE — Assessment & Plan Note (Signed)
 Most recent A1c is  Lab Results  Component Value Date   HGBA1C 5.4 06/14/2022   HGBA1C 5.5 02/19/2018   Previous A1c of 6.  Patient aware of disease state and risk of progression. This may contribute to abnormal cravings, fatigue and diabetic complications without having diabetes.   We have discussed treatment options which include: losing 7 to 10% of body weight, increasing physical activity to a goal of 150 minutes a week at moderate intensity.  Advised to maintain a diet low on simple and processed carbohydrates.  He may also be a candidate for pharmacoprophylaxis with metformin or incretin mimetic.  We will check fasting blood glucose, and insulin  levels.

## 2024-02-21 NOTE — Assessment & Plan Note (Addendum)
 He reports having a history of sleep apnea with intolerance to PAP therapy and possible palate surgery.  He has a high Epworth we discussed the bidirectional relationship between sleep apnea and weight and medical conditions associated with disease.  He will think about having a repeat sleep study.  Losing 10 to 15% of body weight may reduce AHI.

## 2024-02-21 NOTE — Assessment & Plan Note (Signed)
 Blood pressure at goal for age and risk category.  On losartan  100 mg without adverse effects.  Most recent renal parameters reviewed which showed normal electrolytes and kidney function.  Continue with weight loss therapy. Losing 10% may improve blood pressure control. Monitor for symptoms of orthostasis while losing weight. Continue current regimen and home monitoring for a goal blood pressure of 120/80.

## 2024-02-22 LAB — CBC WITH DIFFERENTIAL/PLATELET
Basophils Absolute: 0 x10E3/uL (ref 0.0–0.2)
Basos: 0 %
EOS (ABSOLUTE): 0.4 x10E3/uL (ref 0.0–0.4)
Eos: 5 %
Hematocrit: 47.3 % (ref 37.5–51.0)
Hemoglobin: 15.8 g/dL (ref 13.0–17.7)
Immature Grans (Abs): 0 x10E3/uL (ref 0.0–0.1)
Immature Granulocytes: 0 %
Lymphocytes Absolute: 1.4 x10E3/uL (ref 0.7–3.1)
Lymphs: 20 %
MCH: 31.1 pg (ref 26.6–33.0)
MCHC: 33.4 g/dL (ref 31.5–35.7)
MCV: 93 fL (ref 79–97)
Monocytes Absolute: 0.6 x10E3/uL (ref 0.1–0.9)
Monocytes: 8 %
Neutrophils Absolute: 4.8 x10E3/uL (ref 1.4–7.0)
Neutrophils: 67 %
Platelets: 245 x10E3/uL (ref 150–450)
RBC: 5.08 x10E6/uL (ref 4.14–5.80)
RDW: 12.5 % (ref 11.6–15.4)
WBC: 7.2 x10E3/uL (ref 3.4–10.8)

## 2024-02-22 LAB — COMPREHENSIVE METABOLIC PANEL WITH GFR
ALT: 25 IU/L (ref 0–44)
AST: 26 IU/L (ref 0–40)
Albumin: 4.4 g/dL (ref 3.8–4.8)
Alkaline Phosphatase: 81 IU/L (ref 44–121)
BUN/Creatinine Ratio: 13 (ref 10–24)
BUN: 12 mg/dL (ref 8–27)
Bilirubin Total: 0.6 mg/dL (ref 0.0–1.2)
CO2: 17 mmol/L — ABNORMAL LOW (ref 20–29)
Calcium: 9 mg/dL (ref 8.6–10.2)
Chloride: 104 mmol/L (ref 96–106)
Creatinine, Ser: 0.96 mg/dL (ref 0.76–1.27)
Globulin, Total: 2 g/dL (ref 1.5–4.5)
Glucose: 102 mg/dL — ABNORMAL HIGH (ref 70–99)
Potassium: 4.5 mmol/L (ref 3.5–5.2)
Sodium: 137 mmol/L (ref 134–144)
Total Protein: 6.4 g/dL (ref 6.0–8.5)
eGFR: 83 mL/min/1.73 (ref >59)

## 2024-02-22 LAB — TSH+FREE T4
Free T4: 1.29 ng/dL (ref 0.82–1.77)
TSH: 2.5 u[IU]/mL (ref 0.450–4.500)

## 2024-02-22 LAB — LIPID PANEL WITH LDL/HDL RATIO
Cholesterol, Total: 78 mg/dL — ABNORMAL LOW (ref 100–199)
HDL: 34 mg/dL — ABNORMAL LOW (ref >39)
LDL Chol Calc (NIH): 21 mg/dL (ref 0–99)
LDL/HDL Ratio: 0.6 ratio (ref 0.0–3.6)
Triglycerides: 134 mg/dL (ref 0–149)
VLDL Cholesterol Cal: 23 mg/dL (ref 5–40)

## 2024-02-22 LAB — HEMOGLOBIN A1C
Est. average glucose Bld gHb Est-mCnc: 120 mg/dL
Hgb A1c MFr Bld: 5.8 % — ABNORMAL HIGH (ref 4.8–5.6)

## 2024-02-22 LAB — VITAMIN D 25 HYDROXY (VIT D DEFICIENCY, FRACTURES): Vit D, 25-Hydroxy: 17.7 ng/mL — ABNORMAL LOW (ref 30.0–100.0)

## 2024-02-22 LAB — VITAMIN B12: Vitamin B-12: 409 pg/mL (ref 232–1245)

## 2024-02-22 LAB — INSULIN, RANDOM: INSULIN: 37.9 u[IU]/mL — ABNORMAL HIGH (ref 2.6–24.9)

## 2024-02-26 ENCOUNTER — Other Ambulatory Visit: Payer: Self-pay

## 2024-02-26 MED ORDER — LEVOTHYROXINE SODIUM 137 MCG PO TABS: 137.0000 ug | ORAL_TABLET | Freq: Every day | ORAL | 0 refills | Status: DC

## 2024-03-06 ENCOUNTER — Ambulatory Visit (INDEPENDENT_AMBULATORY_CARE_PROVIDER_SITE_OTHER): Admitting: Internal Medicine

## 2024-03-06 ENCOUNTER — Encounter (INDEPENDENT_AMBULATORY_CARE_PROVIDER_SITE_OTHER): Payer: Self-pay | Admitting: Internal Medicine

## 2024-03-06 VITALS — BP 124/84 | HR 98 | Temp 98.2°F | Ht 71.5 in | Wt 290.0 lb

## 2024-03-06 DIAGNOSIS — E559 Vitamin D deficiency, unspecified: Secondary | ICD-10-CM | POA: Insufficient documentation

## 2024-03-06 DIAGNOSIS — I712 Thoracic aortic aneurysm, without rupture, unspecified: Secondary | ICD-10-CM | POA: Diagnosis not present

## 2024-03-06 DIAGNOSIS — R7303 Prediabetes: Secondary | ICD-10-CM | POA: Diagnosis not present

## 2024-03-06 DIAGNOSIS — R7989 Other specified abnormal findings of blood chemistry: Secondary | ICD-10-CM | POA: Diagnosis not present

## 2024-03-06 DIAGNOSIS — I10 Essential (primary) hypertension: Secondary | ICD-10-CM | POA: Diagnosis not present

## 2024-03-06 DIAGNOSIS — E785 Hyperlipidemia, unspecified: Secondary | ICD-10-CM

## 2024-03-06 DIAGNOSIS — G4733 Obstructive sleep apnea (adult) (pediatric): Secondary | ICD-10-CM

## 2024-03-06 DIAGNOSIS — E88819 Insulin resistance, unspecified: Secondary | ICD-10-CM | POA: Insufficient documentation

## 2024-03-06 DIAGNOSIS — Z6841 Body Mass Index (BMI) 40.0 and over, adult: Secondary | ICD-10-CM

## 2024-03-06 DIAGNOSIS — K5909 Other constipation: Secondary | ICD-10-CM

## 2024-03-06 DIAGNOSIS — E66813 Obesity, class 3: Secondary | ICD-10-CM

## 2024-03-06 MED ORDER — VITAMIN D (ERGOCALCIFEROL) 1.25 MG (50000 UNIT) PO CAPS: 50000.0000 [IU] | ORAL_CAPSULE | ORAL | 0 refills | Status: DC

## 2024-03-06 NOTE — Assessment & Plan Note (Signed)
 Currently on high-dose Crestor  with very low LDL cholesterol levels. LDL is too low and may not require such a high dose of statin. - Discuss with cardiologist about reducing Crestor  dose to 20 mg

## 2024-03-06 NOTE — Assessment & Plan Note (Addendum)
 Most recent A1c is  Lab Results  Component Value Date   HGBA1C 5.8 (H) 02/21/2024   HGBA1C 5.5 02/19/2018   Previous A1c of 6.  Patient aware of disease state and risk of progression. This may contribute to abnormal cravings, fatigue and diabetic complications without having diabetes.   We have discussed treatment options which include: losing 7 to 10% of body weight, increasing physical activity to a goal of 150 minutes a week at moderate intensity.  Advised to maintain a diet low on simple and processed carbohydrates.  He may also be a candidate for pharmacoprophylaxis with metformin or incretin mimetic.  Repeat CO2 before starting metformin.

## 2024-03-06 NOTE — Assessment & Plan Note (Signed)
 Blood pressure at goal for age and risk category.  On losartan  100 mg without adverse effects.  Most recent renal parameters reviewed which showed normal electrolytes and kidney function.  Continue with weight loss therapy. Losing 10% may improve blood pressure control. Monitor for symptoms of orthostasis while losing weight. Continue current regimen and home monitoring for a goal blood pressure of 120/80.

## 2024-03-06 NOTE — Assessment & Plan Note (Signed)
 Obesity with associated insulin  resistance and prediabetes. Current weight management plan includes a 1500 calorie nutrition plan with good adherence, resulting in an 8-pound weight loss. Insulin  levels are high, indicating significant insulin  resistance. Fasting blood sugar is 102, indicating prediabetes. - Repeat bicarbonate level today - Continue 1500 calorie nutrition plan - Increase intake of protein, fruits, and vegetables - Consider metformin if bicarbonate levels are normal - Discuss potential for GLP-1 agonist if sleep apnea is confirmed and insurance covers it

## 2024-03-06 NOTE — Assessment & Plan Note (Signed)
 Untreated obstructive sleep apnea. Low bicarbonate levels may indicate nocturnal hypoxia and CO2 retention, suggesting possible worsening of sleep apnea. - Repeat bicarbonate level today - Consider referral for repeat sleep study if bicarbonate remains low

## 2024-03-06 NOTE — Assessment & Plan Note (Signed)
 Check today, hold off starting metformin.

## 2024-03-06 NOTE — Assessment & Plan Note (Signed)
   Plan:Most recent vitamin D  levels  Lab Results  Component Value Date   VD25OH 17.7 (L) 02/21/2024     Deficiency state associated with adiposity and may result in leptin resistance, weight gain and fatigue.   Plan:Start ergocalciferol  50 K a week for 4 months.

## 2024-03-06 NOTE — Assessment & Plan Note (Signed)
 Constipation with bowel movements every 2-3 days, possibly exacerbated by Xyzal  use. - Discontinue Xyzal  - Increase dietary fiber intake with more vegetables, fruits, and nuts - Continue Miralax as needed

## 2024-03-06 NOTE — Assessment & Plan Note (Signed)
 His HOMA-IR is 9.3 which is elevated. Optimal level < 1.9.   This is complex condition associated with genetics, ectopic fat and lifestyle factors. Insulin  resistance may result in increased fat storage, inhibition of the breakdown of fat, cause fluctuations in blood sugar leading to energy crashes and increased cravings for sugary or high carb foods and cause metabolic slowdown making it difficult to lose weight.  This may result in additional weight gain and lead to pre-diabetes and diabetes if untreated. In addition, hyperinsulinemia increases cardiovascular risk, chronic inflammatory response and may increase the risk of obesity related malignancies.  Lab Results  Component Value Date   HGBA1C 5.8 (H) 02/21/2024   Lab Results  Component Value Date   INSULIN  37.9 (H) 02/21/2024   Lab Results  Component Value Date   GLUCOSE 102 (H) 02/21/2024   GLUCOSE 116 (H) 02/15/2018    We reviewed treatment options which include losing 7 to 10% of body weight, increasing volume of physical activity and maintaining a diet low in saturated fats and with a low glycemic load.  Patient has also been educated on the carb insulin  model of obesity.  He may also be a candidate for pharmacoprophylaxis with metformin and / or GLP1 medication.

## 2024-03-06 NOTE — Progress Notes (Signed)
 Office: [phone]  /  Fax: [phone]  Weight Summary and Body Composition Analysis (BIA)  Vitals Temp: 98.2 F (36.8 C) BP: 124/84 Pulse Rate: 98 SpO2: 96 %   Anthropometric Measurements Height: 5' 11.5 (1.816 m) Weight: 290 lb (131.5 kg) BMI (Calculated): 39.89 Weight at Last Visit: 298 lb Weight Lost Since Last Visit: 8 lb Weight Gained Since Last Visit: 0 lb Starting Weight: 298 lb Peak Weight: 297 lb   Body Composition  Body Fat %: 40.6 % Fat Mass (lbs): 118 lbs Muscle Mass (lbs): 164 lbs Total Body Water (lbs): 132 lbs Visceral Fat Rating : 28    RMR: 2016  Today's Visit #: 2  Starting Date: 02/21/24   Subjective   Chief Complaint: Obesity  Interval History Discussed the use of AI scribe software for clinical note transcription with the patient, who gave verbal consent to proceed.  History of Present Illness   Jackson French is a 73 year old male who presents for medical weight management follow-up.   This is his third visit for medical weight management. He adheres to a 1500 calorie nutrition plan and has successfully lost eight pounds. He tracks his caloric intake, consumes more whole foods, ensures adequate protein intake, maintains hydration, and does not skip meals. He engages in physical activity seven days a week for 20 to 30 minutes.  He has a history of untreated obstructive sleep apnea and possible past palate surgery. Recently, he experienced congestion and difficulty sleeping for about three nights due to a cold. He attributes these symptoms to increased water intake. A CT scan is scheduled for an aneurysm, and he has plans for a dental implant.  He experiences constipation, which he believes is related to his diet and possibly the use of Xyzal . He uses Miralax to manage this, but it takes two to three days to have a bowel movement. He has a history of 13 polyps found during a colonoscopy four years ago, followed by three polyps the  next year. He is up to date with his colonoscopy screenings.  He notes a low resting heart rate, which has been in the 40s during a recent cold, but typically ranges from the high 50s to 60s. He has no dizziness or lightheadedness. 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  He is currently taking Mucinex and Xyzal  for allergy symptoms. He is not taking any calcium  supplements but consumes prunes and kiwis for fiber. He is currently taking Crestor  for cholesterol management, which was prescribed by his cardiologist.     Challenges affecting patient progress: none.    Pharmacotherapy for weight management: He is currently taking no anti-obesity medication.   Assessment and Plan   Treatment Plan For Obesity:  Recommended Dietary Goals  Jackson French is currently in the action stage of change. As such, his goal is to continue weight management plan. He has agreed to: continue current plan  Behavioral Health and Counseling  We discussed the following behavioral modification strategies today: continue to work on maintaining a reduced calorie state, getting the recommended amount of protein, incorporating whole foods, making healthy choices, staying well hydrated and practicing mindfulness when eating..  Additional education and resources provided today: None  Recommended Physical Activity Goals  Jackson French has been advised to work up to 150 minutes of moderate intensity aerobic activity a week and strengthening exercises 2-3 times per week for cardiovascular health, weight loss maintenance and preservation of muscle mass.   He has  agreed to :  Think about enjoyable ways to increase daily physical activity and overcoming barriers to exercise and Increase physical activity in their day and reduce sedentary time (increase NEAT).  Medical Interventions and Pharmacotherapy  We discussed various medication options to help Jackson French with his weight loss efforts and we both  agreed to : Continue with current nutritional and behavioral strategies  Associated Conditions Impacted by Obesity Treatment  Assessment & Plan Low serum bicarbonate Check today, hold off starting metformin.  Vitamin D  deficiency   Plan:Most recent vitamin D  levels  Lab Results  Component Value Date   VD25OH 17.7 (L) 02/21/2024     Deficiency state associated with adiposity and may result in leptin resistance, weight gain and fatigue.   Plan:Start ergocalciferol  50 K a week for 4 months.    Essential hypertension Blood pressure at goal for age and risk category.  On losartan  100 mg without adverse effects.  Most recent renal parameters reviewed which showed normal electrolytes and kidney function.  Continue with weight loss therapy. Losing 10% may improve blood pressure control. Monitor for symptoms of orthostasis while losing weight. Continue current regimen and home monitoring for a goal blood pressure of 120/80.   Thoracic aortic aneurysm without rupture, unspecified part (HCC) Diagnosed in August last year. Scheduled for a CT scan in early August to monitor the condition. Advised against strenuous activity but encouraged light physical activity to maintain health. - Proceed with scheduled CT scan in early August. - Blood pressure under reasonable control.  He does not smoke -He has stopped doing strenuous activity as a result. - Engage in light physical activity, avoiding strenuous exercises OSA (obstructive sleep apnea) Untreated obstructive sleep apnea. Low bicarbonate levels may indicate nocturnal hypoxia and CO2 retention, suggesting possible worsening of sleep apnea. - Repeat bicarbonate level today - Consider referral for repeat sleep study if bicarbonate remains low Chronic constipation Constipation with bowel movements every 2-3 days, possibly exacerbated by Xyzal  use. - Discontinue Xyzal  - Increase dietary fiber intake with more vegetables, fruits, and nuts -  Continue Miralax as needed Class 3 severe obesity due to excess calories with serious comorbidity and body mass index (BMI) of 40.0 to 44.9 in adult Obesity with associated insulin  resistance and prediabetes. Current weight management plan includes a 1500 calorie nutrition plan with good adherence, resulting in an 8-pound weight loss. Insulin  levels are high, indicating significant insulin  resistance. Fasting blood sugar is 102, indicating prediabetes. - Repeat bicarbonate level today - Continue 1500 calorie nutrition plan - Increase intake of protein, fruits, and vegetables - Consider metformin if bicarbonate levels are normal - Discuss potential for GLP-1 agonist if sleep apnea is confirmed and insurance covers it Prediabetes Most recent A1c is  Lab Results  Component Value Date   HGBA1C 5.8 (H) 02/21/2024   HGBA1C 5.5 02/19/2018   Previous A1c of 6.  Patient aware of disease state and risk of progression. This may contribute to abnormal cravings, fatigue and diabetic complications without having diabetes.   We have discussed treatment options which include: losing 7 to 10% of body weight, increasing physical activity to a goal of 150 minutes a week at moderate intensity.  Advised to maintain a diet low on simple and processed carbohydrates.  He may also be a candidate for pharmacoprophylaxis with metformin or incretin mimetic.  Repeat CO2 before starting metformin.   Insulin  resistance His HOMA-IR is 9.3 which is elevated. Optimal level < 1.9.   This is complex condition associated with  genetics, ectopic fat and lifestyle factors. Insulin  resistance may result in increased fat storage, inhibition of the breakdown of fat, cause fluctuations in blood sugar leading to energy crashes and increased cravings for sugary or high carb foods and cause metabolic slowdown making it difficult to lose weight.  This may result in additional weight gain and lead to pre-diabetes and diabetes if  untreated. In addition, hyperinsulinemia increases cardiovascular risk, chronic inflammatory response and may increase the risk of obesity related malignancies.  Lab Results  Component Value Date   HGBA1C 5.8 (H) 02/21/2024   Lab Results  Component Value Date   INSULIN  37.9 (H) 02/21/2024   Lab Results  Component Value Date   GLUCOSE 102 (H) 02/21/2024   GLUCOSE 116 (H) 02/15/2018    We reviewed treatment options which include losing 7 to 10% of body weight, increasing volume of physical activity and maintaining a diet low in saturated fats and with a low glycemic load.  Patient has also been educated on the carb insulin  model of obesity.  He may also be a candidate for pharmacoprophylaxis with metformin and / or GLP1 medication.   Dyslipidemia Currently on high-dose Crestor  with very low LDL cholesterol levels. LDL is too low and may not require such a high dose of statin. - Discuss with cardiologist about reducing Crestor  dose to 20 mg     History of multiple colonic polyps History of multiple colonic polyps with last colonoscopy showing 3 polyps. Next colonoscopy is due in about a year. - Ensure follow-up colonoscopy in approximately one year        Objective   Physical Exam:  Blood pressure 124/84, pulse 98, temperature 98.2 F (36.8 C), height 5' 11.5 (1.816 m), weight 290 lb (131.5 kg), SpO2 96%. Body mass index is 39.88 kg/m.  General: He is overweight, cooperative, alert, well developed, and in no acute distress. PSYCH: Has normal mood, affect and thought process.   HEENT: EOMI, sclerae are anicteric. Lungs: Normal breathing effort, no conversational dyspnea. Extremities: No edema.  Neurologic: No gross sensory or motor deficits. No tremors or fasciculations noted.    Diagnostic Data Reviewed:  BMET    Component Value Date/Time   NA 137 02/21/2024 1036   K 4.5 02/21/2024 1036   CL 104 02/21/2024 1036   CO2 17 (L) 02/21/2024 1036   GLUCOSE 102 (H)  02/21/2024 1036   GLUCOSE 95 09/13/2022 0952   BUN 12 02/21/2024 1036   CREATININE 0.96 02/21/2024 1036   CALCIUM  9.0 02/21/2024 1036   Lab Results  Component Value Date   HGBA1C 5.8 (H) 02/21/2024   HGBA1C 5.5 02/19/2018   Lab Results  Component Value Date   INSULIN  37.9 (H) 02/21/2024   Lab Results  Component Value Date   TSH 2.500 02/21/2024   CBC    Component Value Date/Time   WBC 7.2 02/21/2024 1036   WBC 7.1 12/28/2022 1400   RBC 5.08 02/21/2024 1036   RBC 4.88 12/28/2022 1400   HGB 15.8 02/21/2024 1036   HCT 47.3 02/21/2024 1036   PLT 245 02/21/2024 1036   MCV 93 02/21/2024 1036   MCH 31.1 02/21/2024 1036   MCHC 33.4 02/21/2024 1036   MCHC 33.8 12/28/2022 1400   RDW 12.5 02/21/2024 1036   Iron Studies No results found for: IRON, TIBC, FERRITIN, IRONPCTSAT Lipid Panel     Component Value Date/Time   CHOL 78 (L) 02/21/2024 1036   TRIG 134 02/21/2024 1036   HDL 34 (L) 02/21/2024 1036  CHOLHDL 2.3 05/09/2023 1007   CHOLHDL 3 09/13/2022 0952   VLDL 27.0 09/13/2022 0952   LDLCALC 21 02/21/2024 1036   Hepatic Function Panel     Component Value Date/Time   PROT 6.4 02/21/2024 1036   ALBUMIN 4.4 02/21/2024 1036   AST 26 02/21/2024 1036   ALT 25 02/21/2024 1036   ALKPHOS 81 02/21/2024 1036   BILITOT 0.6 02/21/2024 1036   BILIDIR 0.19 05/09/2023 1007      Component Value Date/Time   TSH 2.500 02/21/2024 1036   Nutritional Lab Results  Component Value Date   VD25OH 17.7 (L) 02/21/2024    Medications: Outpatient Encounter Medications as of 03/06/2024  Medication Sig   levocetirizine (XYZAL ) 5 MG tablet Take 1 tablet (5 mg total) by mouth every evening.   levothyroxine  (SYNTHROID ) 137 MCG tablet Take 1 tablet (137 mcg total) by mouth daily before breakfast.   losartan  (COZAAR ) 100 MG tablet Take 1 tablet (100 mg total) by mouth daily.   rosuvastatin  (CRESTOR ) 40 MG tablet Take 1 tablet (40 mg total) by mouth daily.   Vitamin D ,  Ergocalciferol , (DRISDOL ) 1.25 MG (50000 UNIT) CAPS capsule Take 1 capsule (50,000 Units total) by mouth every 7 (seven) days.   No facility-administered encounter medications on file as of 03/06/2024.     Follow-Up   Return in about 3 weeks (around 03/27/2024) for For Weight Mangement with Dr. Francyne.SABRA He was informed of the importance of frequent follow up visits to maximize his success with intensive lifestyle modifications for his multiple health conditions.  Attestation Statement   Reviewed by clinician on day of visit: allergies, medications, problem list, medical history, surgical history, family history, social history, and previous encounter notes.    I have spent 48 minutes in the care of the patient today including: 3 minutes before the visit reviewing and preparing the chart. 38 minutes face-to-face assessing and reviewing listed medical problems as outlined in obesity care plan, providing nutritional and behavioral counseling on topics outlined in the obesity care plan, independently interpreting test results and goals of care, as described in assessment and plan, reviewing and discussing biometric information and progress, ordering diagnostics - see orders, and ordering medications - see orders 7 minutes after the visit updating chart and documentation of encounter.   Lucas Francyne, MD

## 2024-03-06 NOTE — Assessment & Plan Note (Signed)
 Diagnosed in August last year. Scheduled for a CT scan in early August to monitor the condition. Advised against strenuous activity but encouraged light physical activity to maintain health. - Proceed with scheduled CT scan in early August. - Blood pressure under reasonable control.  He does not smoke -He has stopped doing strenuous activity as a result. - Engage in light physical activity, avoiding strenuous exercises

## 2024-03-07 LAB — CMP14+EGFR
ALT: 31 IU/L (ref 0–44)
AST: 29 IU/L (ref 0–40)
Albumin: 4.5 g/dL (ref 3.8–4.8)
Alkaline Phosphatase: 84 IU/L (ref 44–121)
BUN/Creatinine Ratio: 8 — ABNORMAL LOW (ref 10–24)
BUN: 7 mg/dL — ABNORMAL LOW (ref 8–27)
Bilirubin Total: 0.5 mg/dL (ref 0.0–1.2)
CO2: 18 mmol/L — ABNORMAL LOW (ref 20–29)
Calcium: 9.4 mg/dL (ref 8.6–10.2)
Chloride: 104 mmol/L (ref 96–106)
Creatinine, Ser: 0.93 mg/dL (ref 0.76–1.27)
Globulin, Total: 2.2 g/dL (ref 1.5–4.5)
Glucose: 91 mg/dL (ref 70–99)
Potassium: 4.5 mmol/L (ref 3.5–5.2)
Sodium: 138 mmol/L (ref 134–144)
Total Protein: 6.7 g/dL (ref 6.0–8.5)
eGFR: 87 mL/min/1.73 (ref >59)

## 2024-03-12 ENCOUNTER — Ambulatory Visit (HOSPITAL_BASED_OUTPATIENT_CLINIC_OR_DEPARTMENT_OTHER)
Admission: RE | Admit: 2024-03-12 | Discharge: 2024-03-12 | Disposition: A | Discharge status: Home / Self Care | Source: Ambulatory Visit | Attending: Cardiology | Admitting: Cardiology

## 2024-03-12 ENCOUNTER — Telehealth (INDEPENDENT_AMBULATORY_CARE_PROVIDER_SITE_OTHER): Payer: Self-pay

## 2024-03-12 DIAGNOSIS — I7121 Aneurysm of the ascending aorta, without rupture: Secondary | ICD-10-CM | POA: Diagnosis not present

## 2024-03-12 DIAGNOSIS — I77810 Thoracic aortic ectasia: Secondary | ICD-10-CM | POA: Insufficient documentation

## 2024-03-12 DIAGNOSIS — I2510 Atherosclerotic heart disease of native coronary artery without angina pectoris: Secondary | ICD-10-CM | POA: Diagnosis not present

## 2024-03-12 MED ORDER — IOHEXOL 350 MG/ML SOLN
85.0000 mL | Freq: Once | INTRAVENOUS | Status: AC | PRN
  Administered 2024-03-12: 85 mL via INTRAVENOUS

## 2024-03-12 NOTE — Telephone Encounter (Signed)
 Spoke with pt, let him know recent lab abnormal and will need to call and schedule an appt with his PCP, Dr. Dorina, at 9:40 or 1:40 tomorrow (pt has an 1:40 appt 03/13/2024)

## 2024-03-13 ENCOUNTER — Ambulatory Visit: Payer: Self-pay | Admitting: Medical

## 2024-03-13 ENCOUNTER — Encounter: Payer: Self-pay | Admitting: *Deleted

## 2024-03-13 ENCOUNTER — Ambulatory Visit (INDEPENDENT_AMBULATORY_CARE_PROVIDER_SITE_OTHER): Admitting: Medical

## 2024-03-13 ENCOUNTER — Encounter: Payer: Self-pay | Admitting: Medical

## 2024-03-13 ENCOUNTER — Ambulatory Visit (HOSPITAL_BASED_OUTPATIENT_CLINIC_OR_DEPARTMENT_OTHER)
Admission: RE | Admit: 2024-03-13 | Discharge: 2024-03-13 | Disposition: A | Discharge status: Home / Self Care | Source: Ambulatory Visit | Attending: Medical | Admitting: Medical

## 2024-03-13 VITALS — BP 120/78 | HR 62 | Temp 97.9°F | Resp 17 | Ht 71.5 in | Wt 293.2 lb

## 2024-03-13 DIAGNOSIS — E66813 Obesity, class 3: Secondary | ICD-10-CM | POA: Diagnosis not present

## 2024-03-13 DIAGNOSIS — R7303 Prediabetes: Secondary | ICD-10-CM | POA: Diagnosis not present

## 2024-03-13 DIAGNOSIS — R350 Frequency of micturition: Secondary | ICD-10-CM

## 2024-03-13 DIAGNOSIS — I10 Essential (primary) hypertension: Secondary | ICD-10-CM | POA: Diagnosis not present

## 2024-03-13 DIAGNOSIS — R058 Other specified cough: Secondary | ICD-10-CM | POA: Diagnosis not present

## 2024-03-13 DIAGNOSIS — G4733 Obstructive sleep apnea (adult) (pediatric): Secondary | ICD-10-CM | POA: Diagnosis not present

## 2024-03-13 DIAGNOSIS — K5909 Other constipation: Secondary | ICD-10-CM

## 2024-03-13 DIAGNOSIS — Z6841 Body Mass Index (BMI) 40.0 and over, adult: Secondary | ICD-10-CM | POA: Diagnosis not present

## 2024-03-13 DIAGNOSIS — R059 Cough, unspecified: Secondary | ICD-10-CM | POA: Insufficient documentation

## 2024-03-13 NOTE — Patient Instructions (Signed)
 Low CO2 (bicarbonate) on labs Mildly low CO2 on two lab tests. Differential includes hyperventilation syndrome, asthma, COPD, pneumonia, pneumothorax, kidney issues, pulmonary edema, metabolic acidosis, and lactic acidosis. Recent upper respiratory infection may contribute. No significant metabolic or lactic acidosis symptoms. Good kidney function with GFR of 83. Appears weight loss MD deferred metformin due to lactic acidosis risk. - Order chest x-ray since recent productive cough - Order CBC and metabolic panel. - Consult with Doctor Antonio or Doctor Domenica if CO2 again low  Recent upper respiratory infection with productive cough Recent infection with productive cough lasting 10-14 days. Symptoms improved with no current wheezing and only faint minimal cough now. No smoking history or inhaler use.  Prediabetes A1c of 5.8 indicates prediabetes. Weight loss efforts ongoing with high protein intake. Deferred metformin due to low CO2 and possible lactic acidosis risk? - Continue weight loss efforts.  Obstructive sleep apnea Obstructive sleep apnea with previous CPAP use discontinued due to discomfort. Recent weight loss may impact symptoms. GLP-1 therapy considered contingent on sleep study results. Preference for new GLP-1 pill formulation expected next year. - Schedule sleep study. - Evaluate for GLP-1 therapy based on sleep study results.  Follow up date to be determined after lab and imaging review

## 2024-03-13 NOTE — Progress Notes (Signed)
 Subjective:    Patient ID: Jackson French, male    DOB: 1951-06-03, 73 y.o.   MRN: [id number]  HPI  Jackson French is a 73 year old male who presents with low CO2 levels and recent respiratory symptoms.  His CO2 levels were found to be low during recent lab tests conducted on February 21, 2024, and March 06, 2024, both showing mildly low levels.  He experienced an upper respiratory infection shortly before the first lab test, causing difficulty breathing at night. He used Mucinex to manage symptoms and had a productive cough with sputum production persisted and linger for some time. The cough persisted for about 10 to 14 days, with intermittent wheezing. No current wheezing.  He has a history of thyroidectomy with normal thyroid  levels currently(on treatment presently). He also has a history of sleep apnea and was previously on CPAP therapy, which he discontinued due to discomfort. He is not currently using a CPAP machine.  He has been losing weight through healthy wt loss clinic, having lost 10 pounds between two visits, and is consuming 90 to 120 grams of protein daily. His A1c was recently 5.8, and he is not on metformin. He reports increased urination, attributed to increased water intake, approximately a liter a day. No muscle soreness. He has a history of an aneurysm, limiting his physical activity, particularly weight lifting.    Review of Systems  Constitutional:  Negative for chills, fatigue and fever.  HENT:  Negative for congestion and sinus pressure.   Respiratory:  Negative for cough, choking and wheezing.   Cardiovascular:  Negative for chest pain and palpitations.  Gastrointestinal:  Negative for abdominal pain.  Genitourinary:  Negative for dysuria, frequency and hematuria.  Musculoskeletal:  Negative for back pain, joint swelling and neck pain.  Skin:  Negative for rash.  Neurological:  Negative for dizziness, speech difficulty, weakness and light-headedness.  Hematological:   Negative for adenopathy. Does not bruise/bleed easily.  Psychiatric/Behavioral:  Negative for behavioral problems, decreased concentration and dysphoric mood. The patient is not nervous/anxious.    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  Tobacco Use   Smoking status: Never   Smokeless tobacco: Never  Vaping Use   Vaping status: Never Used  Substance and Sexual Activity   Alcohol use: Yes    Alcohol/week: 2.0 standard drinks of alcohol    Types: 2 Glasses of wine per week    Comment: rarely   Drug use: Never   Sexual activity: Yes  Other Topics Concern   Not on file  Social History Narrative   Not on file   Social Drivers of Health   Financial Resource Strain: Low Risk  (03/12/2024)   Overall Financial Resource Strain (CARDIA)  Difficulty of Paying Living Expenses: Not hard at all  Food Insecurity: No Food Insecurity (03/12/2024)   Hunger Vital Sign    Worried About Running Out of Food in the Last Year: Never true    Ran Out of Food in the Last Year: Never true  Transportation Needs: No Transportation Needs (03/12/2024)   PRAPARE - Administrator, Civil Service (Medical): No    Lack of Transportation (Non-Medical): No  Physical Activity: Insufficiently Active (03/12/2024)   Exercise Vital Sign    Days of Exercise per Week: 2 days    Minutes  of Exercise per Session: 20 min  Stress: No Stress Concern Present (03/12/2024)   [employer] of Occupational Health - Occupational Stress Questionnaire    Feeling of Stress: Not at all  Social Connections: Moderately Integrated (03/12/2024)   Social Connection and Isolation Panel    Frequency of Communication with Friends and Family: Three times a week    Frequency of Social Gatherings with Friends and Family: More than three times a week    Attends Religious Services: 1 to 4 times per year    Active Member of [employer] or Organizations: No    Attends Engineer, structural: Not on file    Marital Status: Married  Catering manager Violence: Not At Risk (06/16/2023)   Humiliation, Afraid, Rape, and Kick questionnaire    Fear of Current or Ex-Partner: No    Emotionally Abused: No    Physically Abused: No    Sexually Abused: No    Past Surgical History:  Procedure Laterality Date   APPENDECTOMY     CATARACT EXTRACTION, BILATERAL Bilateral    COLONOSCOPY  04/19/2022   EYE SURGERY     FRACTURE SURGERY     HYDROCELE EXCISION / REPAIR     THYROID  SURGERY     TONSILLECTOMY     VASECTOMY      Family History  Problem Relation Age of Onset   Arthritis Mother    Breast cancer Mother    Stroke Mother    Stomach cancer Father 16       duodenal cancer   Heart attack Father    Diabetes Father    Hypertension Father    Hyperlipidemia Father    Cancer Father    Sleep apnea Father    Obesity Father    Cancer - Colon Neg Hx    Colon cancer Neg Hx    Rectal cancer Neg Hx    Esophageal cancer Neg Hx     No Known Allergies  Current Outpatient Medications on File Prior to Visit  Medication Sig Dispense Refill   levocetirizine (XYZAL ) 5 MG tablet Take 1 tablet (5 mg total) by mouth every evening. 90 tablet 0   levothyroxine  (SYNTHROID ) 137 MCG tablet Take 1 tablet (137 mcg total) by mouth daily before breakfast. 90 tablet 0   losartan  (COZAAR ) 100 MG tablet Take 1 tablet (100  mg total) by mouth daily. 90 tablet 3   rosuvastatin  (CRESTOR ) 40 MG tablet Take 1 tablet (40 mg total) by mouth daily. 90 tablet 2   Vitamin D , Ergocalciferol , (DRISDOL ) 1.25 MG (50000 UNIT) CAPS capsule Take 1 capsule (50,000 Units total) by mouth every 7 (seven) days. 16 capsule 0   No current facility-administered medications on file prior to visit.    BP 120/78   Pulse 62   Temp 97.9 F (36.6 C) (Oral)   Resp 17   Ht 5' 11.5 (1.816 m)  Wt 293 lb 3.2 oz (133 kg)   SpO2 96%   BMI 40.32 kg/m            Objective:   Physical Exam  General Mental Status- Alert. General Appearance- Not in acute distress.   Skin General: Color- Normal Color. Moisture- Normal Moisture.  Neck Carotid Arteries- Normal color. Moisture- Normal Moisture. No carotid bruits. No JVD.  Chest and Lung Exam Auscultation: Breath Sounds:-CTA  Cardiovascular Auscultation:Rythm- RR Murmurs & Other Heart Sounds:Auscultation of the heart reveals- No Murmurs.  Abdomen Inspection:-Inspeection Normal. Palpation/Percussion:Note:No mass. Palpation and Percussion of the abdomen reveal- Non Tender, Non Distended + BS, no rebound or guarding.   Neurologic Cranial Nerve exam:- CN III-XII intact(No nystagmus), symmetric smile. Strength:- 5/5 equal and symmetric strength both upper and lower extremities.   Lower ext- calfs symmetric, negative homans signs. No pedal edema.    Assessment & Plan:   Patient Instructions  Low CO2 (bicarbonate) on labs Mildly low CO2 on two lab tests. Differential includes hyperventilation syndrome, asthma, COPD, pneumonia, pneumothorax, kidney issues, pulmonary edema, metabolic acidosis, and lactic acidosis. Recent upper respiratory infection may contribute. No significant metabolic or lactic acidosis symptoms. Good kidney function with GFR of 83. Appears weight loss MD deferred metformin due to lactic acidosis risk. - Order chest x-ray since recent productive cough -  Order CBC and metabolic panel. - Consult with Doctor Antonio or Doctor Domenica if CO2 again low  Recent upper respiratory infection with productive cough Recent infection with productive cough lasting 10-14 days. Symptoms improved with no current wheezing and only faint minimal cough now. No smoking history or inhaler use.  Prediabetes A1c of 5.8 indicates prediabetes. Weight loss efforts ongoing with high protein intake. Deferred metformin due to low CO2 and possible lactic acidosis risk? - Continue weight loss efforts.  Obstructive sleep apnea Obstructive sleep apnea with previous CPAP use discontinued due to discomfort. Recent weight loss may impact symptoms. GLP-1 therapy considered contingent on sleep study results. Preference for new GLP-1 pill formulation expected next year. - Schedule sleep study. - Evaluate for GLP-1 therapy based on sleep study results.  Follow up date to be determined after lab and imaging review   Dallas Maxwell, PA-C    Time spent with patient today was 2  minutes which consisted of chart review, discussing differential diagnosis, work up, treatment and documentation.

## 2024-03-14 ENCOUNTER — Ambulatory Visit (INDEPENDENT_AMBULATORY_CARE_PROVIDER_SITE_OTHER): Payer: Self-pay | Admitting: Internal Medicine

## 2024-03-14 LAB — CBC WITH DIFFERENTIAL/PLATELET
Basophils Absolute: 0.1 K/uL (ref 0.0–0.1)
Basophils Relative: 1.3 % (ref 0.0–3.0)
Eosinophils Absolute: 0.5 K/uL (ref 0.0–0.7)
Eosinophils Relative: 7.4 % — ABNORMAL HIGH (ref 0.0–5.0)
HCT: 46 % (ref 39.0–52.0)
Hemoglobin: 15.3 g/dL (ref 13.0–17.0)
Lymphocytes Relative: 22.2 % (ref 12.0–46.0)
Lymphs Abs: 1.6 K/uL (ref 0.7–4.0)
MCHC: 33.3 g/dL (ref 30.0–36.0)
MCV: 91.9 fl (ref 78.0–100.0)
Monocytes Absolute: 0.8 K/uL (ref 0.1–1.0)
Monocytes Relative: 10.3 % (ref 3.0–12.0)
Neutro Abs: 4.3 K/uL (ref 1.4–7.7)
Neutrophils Relative %: 58.8 % (ref 43.0–77.0)
Platelets: 287 K/uL (ref 150.0–400.0)
RBC: 5 Mil/uL (ref 4.22–5.81)
RDW: 13.4 % (ref 11.5–15.5)
WBC: 7.3 K/uL (ref 4.0–10.5)

## 2024-03-14 LAB — COMPREHENSIVE METABOLIC PANEL WITH GFR
ALT: 32 U/L (ref 0–53)
AST: 28 U/L (ref 0–37)
Albumin: 4.7 g/dL (ref 3.5–5.2)
Alkaline Phosphatase: 72 U/L (ref 39–117)
BUN: 23 mg/dL (ref 6–23)
CO2: 21 meq/L (ref 19–32)
Calcium: 9.5 mg/dL (ref 8.4–10.5)
Chloride: 102 meq/L (ref 96–112)
Creatinine, Ser: 0.92 mg/dL (ref 0.40–1.50)
GFR: 82.6 mL/min (ref >60.00)
Glucose, Bld: 86 mg/dL (ref 70–99)
Potassium: 4.7 meq/L (ref 3.5–5.1)
Sodium: 139 meq/L (ref 135–145)
Total Bilirubin: 0.6 mg/dL (ref 0.2–1.2)
Total Protein: 6.8 g/dL (ref 6.0–8.3)

## 2024-03-14 LAB — PSA: PSA: 2.73 ng/mL (ref 0.10–4.00)

## 2024-03-18 ENCOUNTER — Emergency Department (HOSPITAL_BASED_OUTPATIENT_CLINIC_OR_DEPARTMENT_OTHER)
Admission: EM | Admit: 2024-03-18 | Discharge: 2024-03-18 | Disposition: A | Discharge status: Home / Self Care | Attending: Emergency Medicine | Admitting: Emergency Medicine

## 2024-03-18 ENCOUNTER — Emergency Department (HOSPITAL_BASED_OUTPATIENT_CLINIC_OR_DEPARTMENT_OTHER)

## 2024-03-18 ENCOUNTER — Encounter: Payer: Self-pay | Admitting: Medical

## 2024-03-18 ENCOUNTER — Other Ambulatory Visit: Payer: Self-pay

## 2024-03-18 ENCOUNTER — Telehealth: Payer: Self-pay | Admitting: Internal Medicine

## 2024-03-18 ENCOUNTER — Encounter (HOSPITAL_BASED_OUTPATIENT_CLINIC_OR_DEPARTMENT_OTHER): Payer: Self-pay | Admitting: Emergency Medicine

## 2024-03-18 DIAGNOSIS — R109 Unspecified abdominal pain: Secondary | ICD-10-CM | POA: Diagnosis not present

## 2024-03-18 DIAGNOSIS — Z79899 Other long term (current) drug therapy: Secondary | ICD-10-CM | POA: Insufficient documentation

## 2024-03-18 DIAGNOSIS — I10 Essential (primary) hypertension: Secondary | ICD-10-CM | POA: Diagnosis not present

## 2024-03-18 DIAGNOSIS — K5900 Constipation, unspecified: Secondary | ICD-10-CM | POA: Insufficient documentation

## 2024-03-18 DIAGNOSIS — R194 Change in bowel habit: Secondary | ICD-10-CM | POA: Diagnosis present

## 2024-03-18 DIAGNOSIS — N281 Cyst of kidney, acquired: Secondary | ICD-10-CM | POA: Diagnosis not present

## 2024-03-18 DIAGNOSIS — N200 Calculus of kidney: Secondary | ICD-10-CM | POA: Diagnosis not present

## 2024-03-18 LAB — CBC
HCT: 46.3 % (ref 39.0–52.0)
Hemoglobin: 15.7 g/dL (ref 13.0–17.0)
MCH: 30.7 pg (ref 26.0–34.0)
MCHC: 33.9 g/dL (ref 30.0–36.0)
MCV: 90.4 fL (ref 80.0–100.0)
Platelets: UNDETERMINED K/uL (ref 150–400)
RBC: 5.12 MIL/uL (ref 4.22–5.81)
RDW: 12.8 % (ref 11.5–15.5)
WBC: 14.5 K/uL — ABNORMAL HIGH (ref 4.0–10.5)
nRBC: 0 % (ref 0.0–0.2)

## 2024-03-18 LAB — LIPASE, BLOOD: Lipase: 32 U/L (ref 11–51)

## 2024-03-18 LAB — COMPREHENSIVE METABOLIC PANEL WITH GFR
ALT: 40 U/L (ref 0–44)
AST: 41 U/L (ref 15–41)
Albumin: 4.7 g/dL (ref 3.5–5.0)
Alkaline Phosphatase: 86 U/L (ref 38–126)
Anion gap: 14 (ref 5–15)
BUN: 19 mg/dL (ref 8–23)
CO2: 23 mmol/L (ref 22–32)
Calcium: 9.7 mg/dL (ref 8.9–10.3)
Chloride: 103 mmol/L (ref 98–111)
Creatinine, Ser: 1.02 mg/dL (ref 0.61–1.24)
GFR, Estimated: >60 mL/min
Glucose, Bld: 121 mg/dL — ABNORMAL HIGH (ref 70–99)
Potassium: 4.5 mmol/L (ref 3.5–5.1)
Sodium: 140 mmol/L (ref 135–145)
Total Bilirubin: 0.7 mg/dL (ref 0.0–1.2)
Total Protein: 7.3 g/dL (ref 6.5–8.1)

## 2024-03-18 MED ORDER — MINERAL OIL RE ENEM
1.0000 | ENEMA | Freq: Once | RECTAL | Status: AC
  Administered 2024-03-18 (×2): 1 via RECTAL
  Filled 2024-03-18: qty 1

## 2024-03-18 MED ORDER — LACTULOSE 10 GM/15ML PO SOLN
30.0000 g | Freq: Once | ORAL | Status: AC
  Administered 2024-03-18 (×2): 30 g via ORAL
  Filled 2024-03-18: qty 60

## 2024-03-18 NOTE — Discharge Instructions (Addendum)
 It was a pleasure caring for you today in the emergency department.  Please follow up with your primary care doctor within 2-3 days. For constipation we also recommend a diet high in fiber (beans, fruits, vegetables, whole grains). Take Colace 100-200 mg up to three times per day. You may take along with Senokot 1-2 tabs, ingest with full glass of water.  You may also take MiraLAX 1-2 capfuls 1-2 times a day until stools become soft and then slowly decrease the amount of MiraLAX used.  Maintain fluid intake 6-8 glasses per day. Please increase fibers in your diet. You may also take Milk of Magnesia 30 mL as needed for constipation, you may repeat in 2 hours again if no bowl movement.  If you develop abdominal pain, nausea, vomiting, blood in your stools or any worsening or worrisome symptoms please return to the ER for evaluation

## 2024-03-18 NOTE — ED Provider Notes (Signed)
 [place] [facility] Provider Note  CSN: [id number] Arrival date & time: 03/18/24 1220  Chief Complaint(s) Constipation  HPI Jackson French is a 73 y.o. male with past medical history as below, significant for GERD, chronic constipation, HLD, HTN, thoracic aortic aneurysm who presents to the ED with complaint of constipation  Patient reports he has been feeling constipated over the past few days.  He has had small bowel movements but has not completely empty his bowels in multiple days.  No nausea or vomiting, no significant abdominal pain.  He has history of recurrent constipation.  He did take MiraLAX and another over-the-counter lax without much relief of symptoms.  He is tolerant p.o. intake without difficulty, no vomiting.  He follows with GI in the office Dr. Albertus.  No change to urination, no fevers, no chills, no chest pain  Past Medical History Past Medical History:  Diagnosis Date   Allergy    Cataract removed IOLs ou   Chronic constipation    Dyslipidemia    Elevated PSA    GERD (gastroesophageal reflux disease) treated by gastro   Gout    Hyperlipidemia    Hypertension    Hyperthyroidism    Obese    Sleep apnea    no CPAP   Thoracic aortic aneurysm (HCC)    Thyroid  disease    Patient Active Problem List   Diagnosis Date Noted   Insulin  resistance 03/06/2024   Vitamin D  deficiency 03/06/2024   Low serum bicarbonate 03/06/2024   Prediabetes 02/21/2024   Thoracic aortic aneurysm without rupture (HCC) 02/21/2024   Elevated PSA 01/18/2018   Chronic constipation 03/07/2017   Dyslipidemia 03/07/2017   Hypothyroidism 03/07/2017   History of gout 03/07/2017   OSA (obstructive sleep apnea) 03/07/2017   Essential hypertension 05/17/2016   Class 3 severe obesity due to excess calories with serious comorbidity and body mass index (BMI) of 40.0 to 44.9 in adult 05/17/2016   Umbilical hernia without obstruction and without gangrene  12/23/2014   Home Medication(s) Prior to Admission medications   Medication Sig Start Date End Date Taking? Authorizing Provider  levocetirizine (XYZAL ) 5 MG tablet Take 1 tablet (5 mg total) by mouth every evening. 02/02/24   Saguier, [place], PA-C  levothyroxine  (SYNTHROID ) 137 MCG tablet Take 1 tablet (137 mcg total) by mouth daily before breakfast. 02/26/24   Saguier, [place], PA-C  losartan  (COZAAR ) 100 MG tablet Take 1 tablet (100 mg total) by mouth daily. 12/27/23   Pietro Redell RAMAN, MD  rosuvastatin  (CRESTOR ) 40 MG tablet Take 1 tablet (40 mg total) by mouth daily. 12/27/23   Pietro Redell RAMAN, MD  Vitamin D , Ergocalciferol , (DRISDOL ) 1.25 MG (50000 UNIT) CAPS capsule Take 1 capsule (50,000 Units total) by mouth every 7 (seven) days. 03/06/24   Francyne Romano, MD  Past Surgical History Past Surgical History:  Procedure Laterality Date   APPENDECTOMY     CATARACT EXTRACTION, BILATERAL Bilateral    COLONOSCOPY  04/19/2022   EYE SURGERY     FRACTURE SURGERY     HYDROCELE EXCISION / REPAIR     THYROID  SURGERY     TONSILLECTOMY     VASECTOMY     Family History Family History  Problem Relation Age of Onset   Arthritis Mother    Breast cancer Mother    Stroke Mother    Stomach cancer Father 21       duodenal cancer   Heart attack Father    Diabetes Father    Hypertension Father    Hyperlipidemia Father    Cancer Father    Sleep apnea Father    Obesity Father    Cancer - Colon Neg Hx    Colon cancer Neg Hx    Rectal cancer Neg Hx    Esophageal cancer Neg Hx     Social History Social History   Tobacco Use   Smoking status: Never   Smokeless tobacco: Never  Vaping Use   Vaping status: Never Used  Substance Use Topics   Alcohol use: Yes    Alcohol/week: 2.0 standard drinks of alcohol    Types: 2 Glasses of wine per week    Comment:  rarely   Drug use: Never   Allergies Patient has no known allergies.  Review of Systems A thorough review of systems was obtained and all systems are negative except as noted in the HPI and PMH.   Physical Exam Vital Signs  I have reviewed the triage vital signs BP 116/82   Pulse 98   Temp 98.4 F (36.9 C) (Oral)   Resp 16   Ht 5' 11.5 (1.816 m)   Wt 129.3 kg   SpO2 98%   BMI 39.20 kg/m  Physical Exam Vitals and nursing note reviewed.  Constitutional:      General: He is not in acute distress.    Appearance: Normal appearance. He is well-developed. He is not ill-appearing.  HENT:     Head: Normocephalic and atraumatic.     Right Ear: External ear normal.     Left Ear: External ear normal.     Nose: Nose normal.     Mouth/Throat:     Mouth: Mucous membranes are moist.  Eyes:     General: No scleral icterus.       Right eye: No discharge.        Left eye: No discharge.  Cardiovascular:     Rate and Rhythm: Normal rate.  Pulmonary:     Effort: Pulmonary effort is normal. No respiratory distress.     Breath sounds: No stridor.  Abdominal:     General: Abdomen is flat. There is no distension.     Palpations: Abdomen is soft.     Tenderness: There is no guarding.  Musculoskeletal:        General: No deformity.     Cervical back: No rigidity.  Skin:    General: Skin is warm and dry.     Coloration: Skin is not cyanotic, jaundiced or pale.  Neurological:     Mental Status: He is alert and oriented to person, place, and time.     GCS: GCS eye subscore is 4. GCS verbal subscore is 5. GCS motor subscore is 6.  Psychiatric:        Speech: Speech normal.  Behavior: Behavior normal. Behavior is cooperative.     ED Results and Treatments Labs (all labs ordered are listed, but only abnormal results are displayed) Labs Reviewed  COMPREHENSIVE METABOLIC PANEL WITH GFR - Abnormal; Notable for the following components:      Result Value   Glucose, Bld 121 (*)     All other components within normal limits  CBC - Abnormal; Notable for the following components:   WBC 14.5 (*)    All other components within normal limits  LIPASE, BLOOD  URINALYSIS, ROUTINE W REFLEX MICROSCOPIC                                                                                                                          Radiology CT ABDOMEN PELVIS WO CONTRAST Result Date: 03/18/2024 CLINICAL DATA:  Acute abdominal pain EXAM: CT ABDOMEN AND PELVIS WITHOUT CONTRAST TECHNIQUE: Multidetector CT imaging of the abdomen and pelvis was performed following the standard protocol without IV contrast. RADIATION DOSE REDUCTION: This exam was performed according to the departmental dose-optimization program which includes automated exposure control, adjustment of the mA and/or kV according to patient size and/or use of iterative reconstruction technique. COMPARISON:  None. FINDINGS: Lower chest: No acute abnormality. Hepatobiliary: No focal liver abnormality is seen. No gallstones, gallbladder wall thickening, or biliary dilatation. Pancreas: Unremarkable. No pancreatic ductal dilatation or surrounding inflammatory changes. Spleen: Normal in size without focal abnormality. Adrenals/Urinary Tract: There is a 2.2 cm cyst in the right kidney. There is a single punctate calculus in the left kidney. No other urinary tract calculi or hydronephrosis. There is mild nonspecific bilateral perinephric fat stranding. The adrenal glands and bladder are within normal limits. Stomach/Bowel: Stomach is within normal limits. Appendix is not seen. No evidence of bowel wall thickening, distention, or inflammatory changes. Vascular/Lymphatic: Aortic atherosclerosis. No enlarged abdominal or pelvic lymph nodes. Reproductive: Prostate is unremarkable. Other: There is some scarring in the anterior abdominal wall. There is no ascites or significant hernia identified. Musculoskeletal: There are degenerative changes of the lumbar  spine and hips. IMPRESSION: 1. No acute localizing process in the abdomen or pelvis. 2. Nonobstructing left renal calculus. 3. Right renal cyst. No follow-up imaging recommended. Aortic Atherosclerosis (ICD10-I70.0). Electronically Signed   By: Greig Pique M.D.   On: 03/18/2024 16:43    Pertinent labs & imaging results that were available during my care of the patient were reviewed by me and considered in my medical decision making (see MDM for details).  Medications Ordered in ED Medications  mineral oil enema 1 enema (1 enema Rectal Given 03/18/24 1749)  lactulose  (CHRONULAC ) 10 GM/15ML solution 30 g (30 g Oral Given 03/18/24 1834)  Procedures Procedures  (including critical care time)  Medical Decision Making / ED Course    Medical Decision Making:    Lavance Beazer is a 73 y.o. male with past medical history as below, significant for GERD, chronic constipation, HLD, HTN, thoracic aortic aneurysm who presents to the ED with complaint of constipation. The complaint involves an extensive differential diagnosis and also carries with it a high risk of complications and morbidity.  Serious etiology was considered. Ddx includes but is not limited to: Constipation, bowel obstruction, infection, neoplasm, colitis, etc.  Complete initial physical exam performed, notably the patient was in no acute distress, resting comfortably.    Reviewed and confirmed nursing documentation for past medical history, family history, social history.  Vital signs reviewed.    Constipation> - Acute on chronic - Labs do show leukocytosis 14.5, no fever, vital signs are stable, doubt sepsis - CT with increase stool burden, otherwise no evidence of obstruction or acute pathology - Patient had enema with some relief, also was given lactulose  p.o. with further relief.  He is feeling  much better on recheck - Discussed bowel regimen for home, advised patient to increase MiraLAX, increase fluid intake, follow-up PCP.  Patient in no distress and overall condition is stable. Detailed discussions were had with the patient/guardian regarding current findings, and need for close f/u with PCP or on call doctor. The patient/guardian has been instructed to return immediately if the symptoms worsen in any way for re-evaluation. Patient/guardian verbalized understanding and is in agreement with current care plan. All questions answered prior to discharge.                      Additional history obtained: -Additional history obtained from na -External records from outside source obtained and reviewed including: Chart review including previous notes, labs, imaging, consultation notes including  Home medications, prior labs   Lab Tests: -I ordered, reviewed, and interpreted labs.   The pertinent results include:   Labs Reviewed  COMPREHENSIVE METABOLIC PANEL WITH GFR - Abnormal; Notable for the following components:      Result Value   Glucose, Bld 121 (*)    All other components within normal limits  CBC - Abnormal; Notable for the following components:   WBC 14.5 (*)    All other components within normal limits  LIPASE, BLOOD  URINALYSIS, ROUTINE W REFLEX MICROSCOPIC    Notable for leukocytosis  EKG   EKG Interpretation Date/Time:    Ventricular Rate:    PR Interval:    QRS Duration:    QT Interval:    QTC Calculation:   R Axis:      Text Interpretation:           Imaging Studies ordered: I ordered imaging studies including CT AP I independently visualized the following imaging with scope of interpretation limited to determining acute life threatening conditions related to emergency care; findings noted above I agree with the radiologist interpretation If any imaging was obtained with contrast I closely monitored patient for any possible  adverse reaction a/w contrast administration in the emergency department   Medicines ordered and prescription drug management: Meds ordered this encounter  Medications   mineral oil enema 1 enema   lactulose  (CHRONULAC ) 10 GM/15ML solution 30 g    -I have reviewed the patients home medicines and have made adjustments as needed   Consultations Obtained: na   Cardiac Monitoring: Continuous pulse oximetry interpreted by myself, 99% on ra.  Social Determinants of Health:  Diagnosis or treatment significantly limited by social determinants of health: obesity   Reevaluation: After the interventions noted above, I reevaluated the patient and found that they have improved  Co morbidities that complicate the patient evaluation  Past Medical History:  Diagnosis Date   Allergy    Cataract removed IOLs ou   Chronic constipation    Dyslipidemia    Elevated PSA    GERD (gastroesophageal reflux disease) treated by gastro   Gout    Hyperlipidemia    Hypertension    Hyperthyroidism    Obese    Sleep apnea    no CPAP   Thoracic aortic aneurysm (HCC)    Thyroid  disease       Dispostion: Disposition decision including need for hospitalization was considered, and patient discharged from emergency department.    Final Clinical Impression(s) / ED Diagnoses Final diagnoses:  Constipation, unspecified constipation type        Elnor Jayson LABOR, DO 03/18/24 2028

## 2024-03-18 NOTE — ED Notes (Signed)
 Medicated per emar for constipation. Pt tolerated well. Pt sitting up on bedside commode to attempt a BM. On pulse ox and bp cuff.

## 2024-03-18 NOTE — ED Notes (Signed)
 Soap suds enema administered. Awaiting results. Unsuccessful mineral oil enema.

## 2024-03-18 NOTE — ED Triage Notes (Signed)
 Pt c/o fecal impaction.  Reports LBM 4 days ago.  Been taking miralax x 4 days, dulcalax today.  Reports only being able to pass liquid at this time.   Also reports intermittent sweating, nausea.

## 2024-03-18 NOTE — Telephone Encounter (Signed)
 Called and spoke with patient. Patient just arrived at the ER for possible disimpaction.

## 2024-03-18 NOTE — Telephone Encounter (Signed)
 Patient called and stated that he believes he has fecal impaction and is requesting to speak to nurse. Please advise.

## 2024-03-20 NOTE — Addendum Note (Signed)
 Addended by: Rodman Recupero W on: 03/20/2024 11:39 AM   Modules accepted: Orders

## 2024-03-28 ENCOUNTER — Encounter: Payer: Self-pay | Admitting: Medical

## 2024-03-28 ENCOUNTER — Encounter (INDEPENDENT_AMBULATORY_CARE_PROVIDER_SITE_OTHER): Payer: Self-pay | Admitting: Internal Medicine

## 2024-03-28 ENCOUNTER — Ambulatory Visit (INDEPENDENT_AMBULATORY_CARE_PROVIDER_SITE_OTHER): Admitting: Internal Medicine

## 2024-03-28 VITALS — BP 135/87 | HR 84 | Temp 98.2°F | Ht 71.5 in | Wt 281.0 lb

## 2024-03-28 DIAGNOSIS — E66813 Obesity, class 3: Secondary | ICD-10-CM

## 2024-03-28 DIAGNOSIS — E785 Hyperlipidemia, unspecified: Secondary | ICD-10-CM

## 2024-03-28 DIAGNOSIS — Z6841 Body Mass Index (BMI) 40.0 and over, adult: Secondary | ICD-10-CM

## 2024-03-28 DIAGNOSIS — I10 Essential (primary) hypertension: Secondary | ICD-10-CM

## 2024-03-28 DIAGNOSIS — R351 Nocturia: Secondary | ICD-10-CM | POA: Insufficient documentation

## 2024-03-28 DIAGNOSIS — G4733 Obstructive sleep apnea (adult) (pediatric): Secondary | ICD-10-CM | POA: Diagnosis not present

## 2024-03-28 DIAGNOSIS — K5909 Other constipation: Secondary | ICD-10-CM | POA: Diagnosis not present

## 2024-03-28 DIAGNOSIS — E88819 Insulin resistance, unspecified: Secondary | ICD-10-CM

## 2024-03-28 NOTE — Assessment & Plan Note (Signed)
 Has lost nine pounds since the last visit, adhering to a 1500 calorie nutrition plan with fair adherence. Consuming more whole foods, maintaining adequate hydration, and getting the recommended amount of protein. Concerned about plateauing, but currently, weight loss is progressing well. - Continue 1500 calorie nutrition plan - Provide high protein lunch recipes - Encourage use of protein smoothies as meal replacements - Reinforce adherence to dietary plan

## 2024-03-28 NOTE — Assessment & Plan Note (Signed)
 On CPAP losing 15% of body weight may reduce AHI

## 2024-03-28 NOTE — Progress Notes (Signed)
 Office: [phone]  /  Fax: [phone]  Weight Summary and Body Composition Analysis (BIA)  Vitals Temp: 98.2 F (36.8 C) BP: 135/87 Pulse Rate: 84 SpO2: 97 %   Anthropometric Measurements Height: 5' 11.5 (1.816 m) Weight: 281 lb (127.5 kg) BMI (Calculated): 38.65 Weight at Last Visit: 290 lb Weight Lost Since Last Visit: 9 lb Weight Gained Since Last Visit: 0 lb Starting Weight: 298 Total Weight Loss (lbs): 17 lb (7.711 kg) Peak Weight: 297 lb   Body Composition  Body Fat %: 40.1 % Fat Mass (lbs): 113 lbs Muscle Mass (lbs): 160.6 lbs Total Body Water (lbs): 129.4 lbs Visceral Fat Rating : 27    RMR: 2016  Today's Visit #: 3  Starting Date: 02/21/24   Subjective   Chief Complaint: Obesity  Interval History Discussed the use of AI scribe software for clinical note transcription with the patient, who gave verbal consent to proceed.  History of Present Illness   Jackson French is a 73 year old male who presents for medical weight management.  He has lost nine pounds since his last visit and is following a 1500 calorie nutrition plan with fair adherence. His diet focuses on whole foods, adequate protein intake, and maintaining hydration.  He recently experienced severe constipation, leading to an emergency room visit. He believes it was due to fecal impaction, as he had diarrhea around the impaction and had been taking Miralax once daily for three to four days without a bowel movement. After taking Dulcolax, he had a significant bowel movement, which was painful and resulted in bright red blood on the tissue, possibly due to hemorrhoids.  He has a history of severe constipation, which has impacted his life events, such as nearly missing his son's graduation. He currently uses Colace at bedtime and drinks 16 ounces of water in the morning, but still experiences difficulty with bowel movements, requiring significant straining. He reports prolonged time on the  toilet and the need to push hard to pass stool. He has experienced a week of diarrhea following the impaction and continues to have hard stools intermittently.  He is concerned about frequent urination, particularly at night, occurring four to five times, and notes a period post-event where he did not urinate for two to three days. He is currently taking allergy medication and is consuming a significant amount of water.  He is also concerned about his vitamin D  supplementation, noting he has one pill left and questioning the need for a refill.        Challenges affecting patient progress: none.    Pharmacotherapy for weight management: He is currently taking no anti-obesity medication.   Assessment and Plan   Treatment Plan For Obesity:  Recommended Dietary Goals  Gaspare is currently in the action stage of change. As such, his goal is to continue weight management plan. He has agreed to: continue current plan  Behavioral Health and Counseling  We discussed the following behavioral modification strategies today: increasing fiber rich foods, increasing water intake , and continue to work on maintaining a reduced calorie state, getting the recommended amount of protein, incorporating whole foods, making healthy choices, staying well hydrated and practicing mindfulness when eating..  Additional education and resources provided today: None  Recommended Physical Activity Goals  Yuki has been advised to work up to 150 minutes of moderate intensity aerobic activity a week and strengthening exercises 2-3 times per week for cardiovascular health, weight loss maintenance and preservation of muscle mass.   He  has agreed to :  continue to gradually increase the amount and intensity of exercise routine  Medical Interventions and Pharmacotherapy  We discussed various medication options to help Wyeth with his weight loss efforts and we both agreed to : Continue with current nutritional and  behavioral strategies  Associated Conditions Impacted by Obesity Treatment  Assessment & Plan Essential hypertension Slightly above target today previously well-controlled..  On losartan  100 mg without adverse effects.  Most recent renal parameters reviewed which showed normal electrolytes and kidney function.  Continue current weight management strategy.  Class 3 severe obesity due to excess calories with serious comorbidity and body mass index (BMI) of 40.0 to 44.9 in adult Has lost nine pounds since the last visit, adhering to a 1500 calorie nutrition plan with fair adherence. Consuming more whole foods, maintaining adequate hydration, and getting the recommended amount of protein. Concerned about plateauing, but currently, weight loss is progressing well. - Continue 1500 calorie nutrition plan - Provide high protein lunch recipes - Encourage use of protein smoothies as meal replacements - Reinforce adherence to dietary plan Insulin  resistance His HOMA-IR is 9.3 which is elevated. Optimal level < 1.9.   This is complex condition associated with genetics, ectopic fat and lifestyle factors. Insulin  resistance may result in increased fat storage, inhibition of the breakdown of fat, cause fluctuations in blood sugar leading to energy crashes and increased cravings for sugary or high carb foods and cause metabolic slowdown making it difficult to lose weight.  This may result in additional weight gain and lead to pre-diabetes and diabetes if untreated. In addition, hyperinsulinemia increases cardiovascular risk, chronic inflammatory response and may increase the risk of obesity related malignancies.  Lab Results  Component Value Date   HGBA1C 5.8 (H) 02/21/2024   Lab Results  Component Value Date   INSULIN  37.9 (H) 02/21/2024   Lab Results  Component Value Date   GLUCOSE 121 (H) 03/18/2024   GLUCOSE 116 (H) 02/15/2018    We reviewed treatment options which include losing 7 to 10% of  body weight, increasing volume of physical activity and maintaining a diet low in saturated fats and with a low glycemic load.  Patient has also been educated on the carb insulin  model of obesity.  We will hold off metformin because of these episodes of decreased bicarbonate.  Also because of his constipation I do not think he is a good candidate for GLP-1 at present time. OSA (obstructive sleep apnea) On CPAP losing 15% of body weight may reduce AHI Chronic constipation As a courtesy I reviewed his CAT scan done in the emergency room this did not show or at least I did not document a high stool burden or signs of fecal impaction.  Patient does recall straining having a very painful bowel movement with some blood.  He has a history of colonic polyps.  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  Of asked him to schedule an appointment with his primary care to discuss possibly referral to GI. Use MiraLAX once a day Use rectal suppository as needed Follow-up with primary care team may benefit from rectal manometry he also has a history of colonic polyps so may need repeat colonoscopy before that. Nocturia Follow-up with primary care team         Objective   Physical Exam:  Blood pressure 135/87, pulse 84, temperature 98.2 F (36.8 C), height 5' 11.5 (1.816 m), weight 281 lb (127.5 kg), SpO2 97%. Body mass index is 38.65 kg/m.  General: He is overweight, cooperative, alert, well developed, and in no acute distress. PSYCH: Has normal mood, affect and thought process.   HEENT: EOMI, sclerae are anicteric. Lungs: Normal breathing effort, no conversational dyspnea. Extremities: No edema.  Neurologic: No gross sensory or motor deficits. No tremors or fasciculations noted.    Diagnostic Data Reviewed:  BMET    Component Value Date/Time   NA 140 03/18/2024 1306   NA 138 03/06/2024 1423   K 4.5 03/18/2024 1306   CL 103 03/18/2024 1306   CO2 23 03/18/2024 1306   GLUCOSE  121 (H) 03/18/2024 1306   BUN 19 03/18/2024 1306   BUN 7 (L) 03/06/2024 1423   CREATININE 1.02 03/18/2024 1306   CALCIUM  9.7 03/18/2024 1306   GFRNONAA >60 03/18/2024 1306   Lab Results  Component Value Date   HGBA1C 5.8 (H) 02/21/2024   HGBA1C 5.5 02/19/2018   Lab Results  Component Value Date   INSULIN  37.9 (H) 02/21/2024   Lab Results  Component Value Date   TSH 2.500 02/21/2024   CBC    Component Value Date/Time   WBC 14.5 (H) 03/18/2024 1306   RBC 5.12 03/18/2024 1306   HGB 15.7 03/18/2024 1306   HGB 15.8 02/21/2024 1036   HCT 46.3 03/18/2024 1306   HCT 47.3 02/21/2024 1036   PLT PLATELET CLUMPS NOTED ON SMEAR, UNABLE TO ESTIMATE 03/18/2024 1306   PLT 245 02/21/2024 1036   MCV 90.4 03/18/2024 1306   MCV 93 02/21/2024 1036   MCH 30.7 03/18/2024 1306   MCHC 33.9 03/18/2024 1306   RDW 12.8 03/18/2024 1306   RDW 12.5 02/21/2024 1036   Iron Studies No results found for: IRON, TIBC, FERRITIN, IRONPCTSAT Lipid Panel     Component Value Date/Time   CHOL 78 (L) 02/21/2024 1036   TRIG 134 02/21/2024 1036   HDL 34 (L) 02/21/2024 1036   CHOLHDL 2.3 05/09/2023 1007   CHOLHDL 3 09/13/2022 0952   VLDL 27.0 09/13/2022 0952   LDLCALC 21 02/21/2024 1036   Hepatic Function Panel     Component Value Date/Time   PROT 7.3 03/18/2024 1306   PROT 6.7 03/06/2024 1423   ALBUMIN 4.7 03/18/2024 1306   ALBUMIN 4.5 03/06/2024 1423   AST 41 03/18/2024 1306   ALT 40 03/18/2024 1306   ALKPHOS 86 03/18/2024 1306   BILITOT 0.7 03/18/2024 1306   BILITOT 0.5 03/06/2024 1423   BILIDIR 0.19 05/09/2023 1007      Component Value Date/Time   TSH 2.500 02/21/2024 1036   Nutritional Lab Results  Component Value Date   VD25OH 17.7 (L) 02/21/2024    Medications: Outpatient Encounter Medications as of 03/28/2024  Medication Sig   levocetirizine (XYZAL ) 5 MG tablet Take 1 tablet (5 mg total) by mouth every evening.   levothyroxine  (SYNTHROID ) 137 MCG tablet Take 1 tablet  (137 mcg total) by mouth daily before breakfast.   losartan  (COZAAR ) 100 MG tablet Take 1 tablet (100 mg total) by mouth daily.   rosuvastatin  (CRESTOR ) 40 MG tablet Take 1 tablet (40 mg total) by mouth daily.   Vitamin D , Ergocalciferol , (DRISDOL ) 1.25 MG (50000 UNIT) CAPS capsule Take 1 capsule (50,000 Units total) by mouth every 7 (seven) days.   No facility-administered encounter medications on file as of 03/28/2024.     Follow-Up   Return in about 3 weeks (around 04/18/2024) for For Weight Mangement with Dr. Francyne.SABRA He was informed of the importance of frequent follow up visits to maximize his success with  intensive lifestyle modifications for his multiple health conditions.  Attestation Statement   Reviewed by clinician on day of visit: allergies, medications, problem list, medical history, surgical history, family history, social history, and previous encounter notes.     Lucas Parker, MD

## 2024-03-28 NOTE — Assessment & Plan Note (Signed)
 His HOMA-IR is 9.3 which is elevated. Optimal level < 1.9.   This is complex condition associated with genetics, ectopic fat and lifestyle factors. Insulin  resistance may result in increased fat storage, inhibition of the breakdown of fat, cause fluctuations in blood sugar leading to energy crashes and increased cravings for sugary or high carb foods and cause metabolic slowdown making it difficult to lose weight.  This may result in additional weight gain and lead to pre-diabetes and diabetes if untreated. In addition, hyperinsulinemia increases cardiovascular risk, chronic inflammatory response and may increase the risk of obesity related malignancies.  Lab Results  Component Value Date   HGBA1C 5.8 (H) 02/21/2024   Lab Results  Component Value Date   INSULIN  37.9 (H) 02/21/2024   Lab Results  Component Value Date   GLUCOSE 121 (H) 03/18/2024   GLUCOSE 116 (H) 02/15/2018    We reviewed treatment options which include losing 7 to 10% of body weight, increasing volume of physical activity and maintaining a diet low in saturated fats and with a low glycemic load.  Patient has also been educated on the carb insulin  model of obesity.  We will hold off metformin because of these episodes of decreased bicarbonate.  Also because of his constipation I do not think he is a good candidate for GLP-1 at present time.

## 2024-03-28 NOTE — Assessment & Plan Note (Signed)
 Slightly above target today previously well-controlled..  On losartan  100 mg without adverse effects.  Most recent renal parameters reviewed which showed normal electrolytes and kidney function.  Continue current weight management strategy.

## 2024-03-28 NOTE — Assessment & Plan Note (Signed)
 As a courtesy I reviewed his CAT scan done in the emergency room this did not show or at least I did not document a high stool burden or signs of fecal impaction.  Patient does recall straining having a very painful bowel movement with some blood.  He has a history of colonic polyps.  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  Of asked him to schedule an appointment with his primary care to discuss possibly referral to GI. Use MiraLAX once a day Use rectal suppository as needed Follow-up with primary care team may benefit from rectal manometry he also has a history of colonic polyps so may need repeat colonoscopy before that.

## 2024-03-28 NOTE — Assessment & Plan Note (Signed)
 Follow-up with primary care team

## 2024-03-30 NOTE — Addendum Note (Signed)
 Addended by: DORINA DALLAS HERO on: 03/30/2024 08:29 AM   Modules accepted: Orders

## 2024-04-15 ENCOUNTER — Telehealth: Payer: Self-pay | Admitting: Internal Medicine

## 2024-04-15 NOTE — Telephone Encounter (Signed)
 Pt reports he is passing some stool but straining a lot and having some rectal bleeding due to the straining. He had been taking 1-2 doses of miralax daily along with colace. Discussed with pt that he could do a miralax purge-7 doses of miralax mixed with 32oz og gatorade, drink 1 cup every 15min until gone. Pt verbalized understanding. He also has a dulcolax supp he may take. He knows to cal back if he continues to have issues.

## 2024-04-15 NOTE — Telephone Encounter (Signed)
 Inbound call from pt requesting to be seen soon due to him having a fecal impaction and was seen back in August for. Patient also stated that he is having problem with constipation and would like to speak to the nurse in regards to this situation. Please advise.

## 2024-04-25 ENCOUNTER — Telehealth: Payer: Self-pay | Admitting: Internal Medicine

## 2024-04-25 NOTE — Telephone Encounter (Signed)
 Pt states he has some hard stool and has been straining but is unable to get it to pass. He has taken 2 doses of miralax this am. Discussed with pt that he can take 5 doses mixed with 4 cups of liquid and drink 1 cup every 15 min until gone (miralax purge with 5 doses since he has already taken 2 this am). Pt states he was told to do this previously but did not do it. He states he will try it now and knows to call back if this does not help.

## 2024-04-25 NOTE — Telephone Encounter (Signed)
 Received a call from patient stating he would like a nurse fu call to discuss symptoms of constipation before appoint on 11/1. Please review and advise   Thank you

## 2024-04-29 ENCOUNTER — Other Ambulatory Visit: Payer: Self-pay | Admitting: Medical

## 2024-05-02 ENCOUNTER — Ambulatory Visit (INDEPENDENT_AMBULATORY_CARE_PROVIDER_SITE_OTHER): Admitting: Internal Medicine

## 2024-05-02 ENCOUNTER — Encounter (INDEPENDENT_AMBULATORY_CARE_PROVIDER_SITE_OTHER): Payer: Self-pay | Admitting: Internal Medicine

## 2024-05-02 VITALS — BP 110/73 | HR 66 | Temp 98.4°F | Ht 71.5 in | Wt 274.0 lb

## 2024-05-02 DIAGNOSIS — K5909 Other constipation: Secondary | ICD-10-CM

## 2024-05-02 DIAGNOSIS — I10 Essential (primary) hypertension: Secondary | ICD-10-CM

## 2024-05-02 DIAGNOSIS — Z6841 Body Mass Index (BMI) 40.0 and over, adult: Secondary | ICD-10-CM

## 2024-05-02 DIAGNOSIS — R7989 Other specified abnormal findings of blood chemistry: Secondary | ICD-10-CM

## 2024-05-02 DIAGNOSIS — G4733 Obstructive sleep apnea (adult) (pediatric): Secondary | ICD-10-CM | POA: Diagnosis not present

## 2024-05-02 DIAGNOSIS — E66813 Obesity, class 3: Secondary | ICD-10-CM | POA: Diagnosis not present

## 2024-05-02 DIAGNOSIS — R7303 Prediabetes: Secondary | ICD-10-CM | POA: Diagnosis not present

## 2024-05-02 NOTE — Progress Notes (Signed)
 Office: [phone]  /  Fax: [phone]  Weight Summary and Body Composition Analysis (BIA)  Vitals Temp: 98.4 F (36.9 C) BP: 110/73 Pulse Rate: 66 SpO2: 97 %   Anthropometric Measurements Height: 5' 11.5 (1.816 m) Weight: 274 lb (124.3 kg) BMI (Calculated): 37.69 Weight at Last Visit: 281 lb Weight Lost Since Last Visit: 7 lb Weight Gained Since Last Visit: 0 lb Starting Weight: 298 lb Total Weight Loss (lbs): 24 lb (10.9 kg) Peak Weight: 297 lb   Body Composition  Body Fat %: 39.5 % Fat Mass (lbs): 108.6 lbs Muscle Mass (lbs): 158 lbs Total Body Water (lbs): 126.2 lbs Visceral Fat Rating : 26    RMR: 2016  Today's Visit #: 4  Starting Date: 02/21/24   Subjective   Chief Complaint: Obesity  Interval History Discussed the use of AI scribe software for clinical note transcription with the patient, who gave verbal consent to proceed.  History of Present Illness Jackson French is a 73 year old male with hypertension, insulin  resistance, and sleep apnea who presents for medical weight management.  He has lost seven pounds since his last visit, totaling a loss of twenty-four pounds. He adheres to a fifteen hundred calorie nutrition plan and exercises by walking three to five thousand steps daily. He experiences adequate sleep, hydration, and protein intake.  He experiences constipation, with three episodes since August 11th, the first of which led him to the ER. He manages the condition with Miralax flushes. He has adjusted his diet to include more leafy greens to help manage constipation.  He has a history of weight fluctuations, with a significant weight of 312 pounds in December 2020. He attributes past weight regain to lifestyle changes, including moving to [place] to care for his grandchildren, which affected his diet and exercise routine. He now takes care of his grandchildren from 7:30 AM to 5:00 PM, which previously impacted his ability to exercise  and eat properly.  He is currently walking more than before, achieving three to five thousand steps daily, and has increased his steps to six to seven thousand on occasion. He has also started light weight training at the [facility], using weights no more than thirty pounds.  He is taking his cholesterol medication daily and reports his blood pressure is well-controlled.     Challenges affecting patient progress: none.    Pharmacotherapy for weight management: He is currently taking no anti-obesity medication.   Assessment and Plan   Treatment Plan For Obesity:  Recommended Dietary Goals  Tatsuya is currently in the action stage of change. As such, his goal is to continue weight management plan. He has agreed to: continue current plan  Behavioral Health and Counseling  We discussed the following behavioral modification strategies today: continue to work on maintaining a reduced calorie state, getting the recommended amount of protein, incorporating whole foods, making healthy choices, staying well hydrated and practicing mindfulness when eating. and increase protein intake, fibrous foods (25 grams per day for women, 30 grams for men) and water to improve satiety and decrease hunger signals. .  Additional education and resources provided today: None  Recommended Physical Activity Goals  Jacquis has been advised to work up to 150 minutes of moderate intensity aerobic activity a week and strengthening exercises 2-3 times per week for cardiovascular health, weight loss maintenance and preservation of muscle mass.  He has agreed to :  Think about enjoyable ways to increase daily physical activity and overcoming barriers to exercise, Increase physical  activity in their day and reduce sedentary time (increase NEAT)., Increase volume of physical activity to a goal of 240 minutes a week, and Combine aerobic and strengthening exercises for efficiency and improved cardiometabolic health.  Medical  Interventions and Pharmacotherapy  We discussed various medication options to help Yaacov with his weight loss efforts and we both agreed to : Continue with current nutritional and behavioral strategies and at present time he is not a good candidate for antiobesity medications due to the presence of significant constipation which is about to be evaluated.  Associated Conditions Impacted by Obesity Treatment  Assessment & Plan Essential hypertension Vitals:   05/02/24 1000  BP: 110/73    Blood pressure is at goal for age and risk category.  On losartan  100 mg once a day without adverse effects.  Most recent renal parameters reviewed which showed stable electrolytes and kidney function.  Continue with weight loss therapy. Losing 10% may improve blood pressure control. Monitor for symptoms of orthostasis while losing weight. Continue current regimen and home monitoring for a goal blood pressure of < 120/80.   OSA (obstructive sleep apnea) On CPAP losing 15% of body weight may reduce AHI.  He is not a candidate for GLP-1 due to significant problems with constipation. Class 3 severe obesity due to excess calories with serious comorbidity and body mass index (BMI) of 40.0 to 44.9 in adult Since starting program  weight: decrease of 28 lb (9.3%) over 5 months, 3 weeks  Start: 11/07/2023 302 lb (137 kg) (H)  End: 05/02/2024 274 lb (124.3 kg)  Since he started his journey 3 years ago Weight: decrease of 40.6 lb (12.9%) over 3 years, 6 months  Start: 10/13/2020 314 lb 9.6 oz (142.7 kg) (H)  End: 05/02/2024 274 lb (124.3 kg)  Obesity with BMI reduction from 41 to 37, total weight loss of 40 pounds since 2022, and recent loss of 24 pounds. Effective weight management through a 1500 calorie nutrition plan and regular exercise. Weight loss exceeds expected 1-2 pounds per week. Emphasis on consistency in lifestyle changes to maintain weight loss and prevent regain.  Monitor for muscle loss. - Continue 1500  calorie nutrition plan - Continue regular exercise, gradually increasing steps - Monitor for decrease in muscle mass.  We discussed the importance of maintaining adequate protein intake and performing strengthening exercises. Low serum bicarbonate Follow-up anion gap has improved.  No further workup necessary. Chronic constipation As a courtesy I reviewed his CAT scan done in the emergency room this did not show or at least I did not document a high stool burden or signs of fecal impaction.  Patient does report significant straining and pain with defecation at times.  He has a history of colonic polyps.  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  He has an appointment scheduled with GI Continue to use MiraLAX once to twice a day Use rectal suppository as needed Follow-up with primary care team may benefit from rectal manometry he also has a history of colonic polyps so may need repeat colonoscopy before that. Prediabetes Most recent A1c is  Lab Results  Component Value Date   HGBA1C 5.8 (H) 02/21/2024   HGBA1C 5.5 02/19/2018   Previous A1c of 6.  Patient aware of disease state and risk of progression. This may contribute to abnormal cravings, fatigue and diabetic complications without having diabetes.   We have discussed treatment options which include: losing 7 to 10% of body weight, increasing physical activity to a goal  of 150 minutes a week at moderate intensity.  Advised to maintain a diet low on simple and processed carbohydrates.  Now that his bicarbonate is back to normal we may consider starting metformin for pharmacoprophylaxis.     Assessment and Plan Assessment & Plan Obesity   Constipation Intermittent constipation with three episodes since August 11, requiring ER visit and Miralax flushes. Currently managed with Miralax and dietary adjustments. Adequate hydration and fiber intake maintained. Differential includes stress and potential rectal area issues  due to straining. - Continue Miralax as needed - Maintain adequate hydration and fiber intake - Avoid constipating foods - Follow up with gastroenterology on November 11  Essential hypertension Blood pressure is well-controlled with current medication regimen.  Hyperlipidemia Cholesterol levels are well-controlled with current medication regimen.  Insulin  resistance  Recording duration: 16 minutes      Objective   Physical Exam:  Blood pressure 110/73, pulse 66, temperature 98.4 F (36.9 C), height 5' 11.5 (1.816 m), weight 274 lb (124.3 kg), SpO2 97%. Body mass index is 37.68 kg/m.  General: He is overweight, cooperative, alert, well developed, and in no acute distress. PSYCH: Has normal mood, affect and thought process.   HEENT: EOMI, sclerae are anicteric. Lungs: Normal breathing effort, no conversational dyspnea. Extremities: No edema.  Neurologic: No gross sensory or motor deficits. No tremors or fasciculations noted.    Diagnostic Data Reviewed:  BMET    Component Value Date/Time   NA 140 03/18/2024 1306   NA 138 03/06/2024 1423   K 4.5 03/18/2024 1306   CL 103 03/18/2024 1306   CO2 23 03/18/2024 1306   GLUCOSE 121 (H) 03/18/2024 1306   BUN 19 03/18/2024 1306   BUN 7 (L) 03/06/2024 1423   CREATININE 1.02 03/18/2024 1306   CALCIUM  9.7 03/18/2024 1306   GFRNONAA >60 03/18/2024 1306   Lab Results  Component Value Date   HGBA1C 5.8 (H) 02/21/2024   HGBA1C 5.5 02/19/2018   Lab Results  Component Value Date   INSULIN  37.9 (H) 02/21/2024   Lab Results  Component Value Date   TSH 2.500 02/21/2024   CBC    Component Value Date/Time   WBC 14.5 (H) 03/18/2024 1306   RBC 5.12 03/18/2024 1306   HGB 15.7 03/18/2024 1306   HGB 15.8 02/21/2024 1036   HCT 46.3 03/18/2024 1306   HCT 47.3 02/21/2024 1036   PLT PLATELET CLUMPS NOTED ON SMEAR, UNABLE TO ESTIMATE 03/18/2024 1306   PLT 245 02/21/2024 1036   MCV 90.4 03/18/2024 1306   MCV 93 02/21/2024 1036    MCH 30.7 03/18/2024 1306   MCHC 33.9 03/18/2024 1306   RDW 12.8 03/18/2024 1306   RDW 12.5 02/21/2024 1036   Iron Studies No results found for: IRON, TIBC, FERRITIN, IRONPCTSAT Lipid Panel     Component Value Date/Time   CHOL 78 (L) 02/21/2024 1036   TRIG 134 02/21/2024 1036   HDL 34 (L) 02/21/2024 1036   CHOLHDL 2.3 05/09/2023 1007   CHOLHDL 3 09/13/2022 0952   VLDL 27.0 09/13/2022 0952   LDLCALC 21 02/21/2024 1036   Hepatic Function Panel     Component Value Date/Time   PROT 7.3 03/18/2024 1306   PROT 6.7 03/06/2024 1423   ALBUMIN 4.7 03/18/2024 1306   ALBUMIN 4.5 03/06/2024 1423   AST 41 03/18/2024 1306   ALT 40 03/18/2024 1306   ALKPHOS 86 03/18/2024 1306   BILITOT 0.7 03/18/2024 1306   BILITOT 0.5 03/06/2024 1423   BILIDIR 0.19 05/09/2023 1007  Component Value Date/Time   TSH 2.500 02/21/2024 1036   Nutritional Lab Results  Component Value Date   VD25OH 17.7 (L) 02/21/2024    Medications: Outpatient Encounter Medications as of 05/02/2024  Medication Sig   levocetirizine (XYZAL ) 5 MG tablet TAKE 1 TABLET BY MOUTH EVERY DAY IN THE EVENING   levothyroxine  (SYNTHROID ) 137 MCG tablet Take 1 tablet (137 mcg total) by mouth daily before breakfast.   losartan  (COZAAR ) 100 MG tablet Take 1 tablet (100 mg total) by mouth daily.   rosuvastatin  (CRESTOR ) 40 MG tablet Take 1 tablet (40 mg total) by mouth daily.   Vitamin D , Ergocalciferol , (DRISDOL ) 1.25 MG (50000 UNIT) CAPS capsule Take 1 capsule (50,000 Units total) by mouth every 7 (seven) days.   No facility-administered encounter medications on file as of 05/02/2024.     Follow-Up   Return in about 4 weeks (around 05/30/2024) for For Weight Mangement with Dr. Francyne.SABRA He was informed of the importance of frequent follow up visits to maximize his success with intensive lifestyle modifications for his multiple health conditions.  Attestation Statement   Reviewed by clinician on day of visit:  allergies, medications, problem list, medical history, surgical history, family history, social history, and previous encounter notes.     Lucas Francyne, MD

## 2024-05-03 NOTE — Assessment & Plan Note (Signed)
 Follow-up anion gap has improved.  No further workup necessary.

## 2024-05-03 NOTE — Assessment & Plan Note (Signed)
 Since starting program  weight: decrease of 28 lb (9.3%) over 5 months, 3 weeks  Start: 11/07/2023 302 lb (137 kg) (H)  End: 05/02/2024 274 lb (124.3 kg)  Since he started his journey 3 years ago Weight: decrease of 40.6 lb (12.9%) over 3 years, 6 months  Start: 10/13/2020 314 lb 9.6 oz (142.7 kg) (H)  End: 05/02/2024 274 lb (124.3 kg)  Obesity with BMI reduction from 41 to 37, total weight loss of 40 pounds since 2022, and recent loss of 24 pounds. Effective weight management through a 1500 calorie nutrition plan and regular exercise. Weight loss exceeds expected 1-2 pounds per week. Emphasis on consistency in lifestyle changes to maintain weight loss and prevent regain.  Monitor for muscle loss. - Continue 1500 calorie nutrition plan - Continue regular exercise, gradually increasing steps - Monitor for decrease in muscle mass.  We discussed the importance of maintaining adequate protein intake and performing strengthening exercises.

## 2024-05-03 NOTE — Assessment & Plan Note (Signed)
 On CPAP losing 15% of body weight may reduce AHI.  He is not a candidate for GLP-1 due to significant problems with constipation.

## 2024-05-03 NOTE — Assessment & Plan Note (Signed)
 As a courtesy I reviewed his CAT scan done in the emergency room this did not show or at least I did not document a high stool burden or signs of fecal impaction.  Patient does report significant straining and pain with defecation at times.  He has a history of colonic polyps.  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  He has an appointment scheduled with GI Continue to use MiraLAX once to twice a day Use rectal suppository as needed Follow-up with primary care team may benefit from rectal manometry he also has a history of colonic polyps so may need repeat colonoscopy before that.

## 2024-05-03 NOTE — Assessment & Plan Note (Signed)
 Vitals:   05/02/24 1000  BP: 110/73    Blood pressure is at goal for age and risk category.  On losartan  100 mg once a day without adverse effects.  Most recent renal parameters reviewed which showed stable electrolytes and kidney function.  Continue with weight loss therapy. Losing 10% may improve blood pressure control. Monitor for symptoms of orthostasis while losing weight. Continue current regimen and home monitoring for a goal blood pressure of < 120/80.

## 2024-05-03 NOTE — Assessment & Plan Note (Signed)
 Most recent A1c is  Lab Results  Component Value Date   HGBA1C 5.8 (H) 02/21/2024   HGBA1C 5.5 02/19/2018   Previous A1c of 6.  Patient aware of disease state and risk of progression. This may contribute to abnormal cravings, fatigue and diabetic complications without having diabetes.   We have discussed treatment options which include: losing 7 to 10% of body weight, increasing physical activity to a goal of 150 minutes a week at moderate intensity.  Advised to maintain a diet low on simple and processed carbohydrates.  Now that his bicarbonate is back to normal we may consider starting metformin for pharmacoprophylaxis.

## 2024-05-24 ENCOUNTER — Other Ambulatory Visit: Payer: Self-pay | Admitting: Medical

## 2024-05-31 ENCOUNTER — Ambulatory Visit (INDEPENDENT_AMBULATORY_CARE_PROVIDER_SITE_OTHER)

## 2024-05-31 DIAGNOSIS — Z23 Encounter for immunization: Secondary | ICD-10-CM | POA: Diagnosis not present

## 2024-05-31 NOTE — Progress Notes (Signed)
 Pt was in office today for influenza vaccine per PCP, vaccine was given in R deltoid and pt tolerated well.

## 2024-06-04 ENCOUNTER — Encounter (INDEPENDENT_AMBULATORY_CARE_PROVIDER_SITE_OTHER): Payer: Self-pay | Admitting: Internal Medicine

## 2024-06-04 ENCOUNTER — Ambulatory Visit (INDEPENDENT_AMBULATORY_CARE_PROVIDER_SITE_OTHER): Admitting: Internal Medicine

## 2024-06-04 VITALS — BP 122/81 | HR 71 | Temp 97.7°F | Ht 71.5 in | Wt 271.0 lb

## 2024-06-04 DIAGNOSIS — E66813 Obesity, class 3: Secondary | ICD-10-CM

## 2024-06-04 DIAGNOSIS — G4733 Obstructive sleep apnea (adult) (pediatric): Secondary | ICD-10-CM

## 2024-06-04 DIAGNOSIS — K5909 Other constipation: Secondary | ICD-10-CM

## 2024-06-04 DIAGNOSIS — R7303 Prediabetes: Secondary | ICD-10-CM

## 2024-06-04 DIAGNOSIS — I10 Essential (primary) hypertension: Secondary | ICD-10-CM

## 2024-06-04 DIAGNOSIS — Z6837 Body mass index (BMI) 37.0-37.9, adult: Secondary | ICD-10-CM

## 2024-06-04 NOTE — Progress Notes (Signed)
 Office: [phone]  /  Fax: [phone]  Weight Summary and Body Composition Analysis (BIA)  Vitals Temp: 97.7 F (36.5 C) BP: 122/81 Pulse Rate: 71 SpO2: 99 %   Anthropometric Measurements Height: 5' 11.5 (1.816 m) Weight: 271 lb (122.9 kg) BMI (Calculated): 37.27 Weight at Last Visit: 274 lb Weight Lost Since Last Visit: 3 lb Weight Gained Since Last Visit: 0 lb Starting Weight: 298 lb Total Weight Loss (lbs): 27 lb (12.2 kg) Peak Weight: 297 lb   Body Composition  Body Fat %: 38.8 % Fat Mass (lbs): 105.4 lbs Muscle Mass (lbs): 157.8 lbs Total Body Water (lbs): 123.2 lbs Visceral Fat Rating : 25    RMR: 2016  Today's Visit #: 5  Starting Date: 02/21/24   Subjective   Chief Complaint: Obesity  Interval History Discussed the use of AI scribe software for clinical note transcription with the patient, who gave verbal consent to proceed.  History of Present Illness Jackson French is a 73 year old male who presents for medical weight management.  He has lost approximately thirty-one pounds over six months, nearing ten percent of his body weight, and is pleased with his progress. He follows a fifteen hundred calorie nutrition plan sixty percent of the time, eats more whole foods, gets the recommended amount of protein, and maintains adequate hydration. He exercises five days a week, walking about five thousand to seven thousand steps daily. His goal is to reach a weight of two forty and eventually two twenty, which he last weighed in college.  He experiences ongoing constipation despite various attempts to manage it. He has tried increasing dietary fiber but notes that it adds more calories to his diet. He takes half a shot of extra virgin olive oil on an empty stomach in the morning, drinks water, and occasionally has coffee. He eats three to four vegetable meals at lunch from places like [employer] and a location near the university, and consumes factory  meals in the evening, which contain some fiber. He is trying to avoid regular use of Miralax but uses Miralax fiber, which contains magnesium hydroxide. He is unsure of the appropriate dosage, as the directions suggest two to four per day for adults, and he has been taking two. He has an upcoming appointment with gastroenterology on the eleventh to seek further guidance. He is concerned about straining due to an aneurysm and reports bowel movements of 'golf ball size to mini rock size' a couple of times a day.  He does not track his fiber intake but estimates he is not reaching thirty grams per day. He has tried various methods to increase fiber, such as consuming frozen vegetables, kefir, yogurt, peanut butter, and forty-five calorie bread in the morning. He finds that a Miralax flush leads to temporary relief followed by constipation. He has a history of polyps, having had thirteen polyps removed during one colonoscopy and three during another. He anticipates needing another colonoscopy in the spring but has faced delays in scheduling.  He uses magnesium hydroxide at a dose of twelve hundred milligrams but has not found it effective. He is hesitant to use Miralax regularly due to inconsistent results. He has been advised about medications like Linzess and Amitiza for chronic constipation but has not yet tried them.     Challenges affecting patient progress: lack of strengthening exercise, medical comorbidities, and slow metabolism for age.    Pharmacotherapy for weight management: He is currently taking no anti-obesity medication.   Assessment and  Plan   Treatment Plan For Obesity:  Recommended Dietary Goals  Jackson French is currently in the action stage of change. As such, his goal is to continue weight management plan. He has agreed to: Start a different plan targeting 1500 cal high in fiber moderated protein with over 35 g of fiber per dayto help with his constipation  Behavioral Health and  Counseling  We discussed the following behavioral modification strategies today: increasing fiber rich foods, increasing water intake , work on meal planning and preparation, work on tracking and journaling calories using tracking application, and continue to work on maintaining a reduced calorie state, getting the recommended amount of protein, incorporating whole foods, making healthy choices, staying well hydrated and practicing mindfulness when eating..  Additional education and resources provided today: None  Recommended Physical Activity Goals  Jackson French has been advised to work up to 150 minutes of moderate intensity aerobic activity a week and strengthening exercises 2-3 times per week for cardiovascular health, weight loss maintenance and preservation of muscle mass.  He has agreed to :  Increase volume of physical activity to a goal of 240 minutes a week and Combine aerobic and strengthening exercises for efficiency and improved cardiometabolic health.  Medical Interventions and Pharmacotherapy  We discussed various medication options to help Jackson French with his weight loss efforts and we both agreed to : Continue with current nutritional and behavioral strategies  Associated Conditions Impacted by Obesity Treatment  Assessment & Plan Essential hypertension Vitals:   06/04/24 0800 06/04/24 0901 06/04/24 0903  BP: (!) 127/91 (!) 127/91 122/81    Blood pressure is at goal for age and risk category.  On losartan  100 mg once a day without adverse effects.  Most recent renal parameters reviewed which showed stable electrolytes and kidney function.  Continue with weight loss therapy. Losing 10% may improve blood pressure control. Monitor for symptoms of orthostasis while losing weight. Continue current regimen and home monitoring for a goal blood pressure of < 120/80.   Class 3 severe obesity due to excess calories with serious comorbidity and body mass index (BMI) of 40.0 to 44.9 in adult  [facility]) He has lost approximately 31 pounds over six months, nearing a 10% weight loss. He is following a 1500 calorie nutrition plan 60% of the time, exercising five days a week, and walking 5000 to 7000 steps daily. The goal is to reach 240 pounds, with a long-term goal of 220 pounds. Current weight is the lowest in a long time, and he is pleased with the progress. - Continue 1500 calorie nutrition plan with a focus on high fiber and moderate protein - Encourage continuation of physical activity, including walking and light weight lifting - Reassess weight and progress in four weeks OSA (obstructive sleep apnea) On CPAP losing 15% of body weight may reduce AHI.  He is not a candidate for GLP-1 due to significant problems with constipation. Prediabetes Most recent A1c is  Lab Results  Component Value Date   HGBA1C 5.8 (H) 02/21/2024   HGBA1C 5.5 02/19/2018   Previous A1c of 6.  Patient aware of disease state and risk of progression. This may contribute to abnormal cravings, fatigue and diabetic complications without having diabetes.   We have discussed treatment options which include: losing 7 to 10% of body weight, increasing physical activity to a goal of 150 minutes a week at moderate intensity.  Advised to maintain a diet low on simple and processed carbohydrates.  Now that his bicarbonate is back to  normal we may consider starting metformin for pharmacoprophylaxis.  Chronic constipation  Patient  report significant straining and pain with defecation at times.  He has a history of colonic polyps.  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  He has an appointment scheduled with GI Continue to use MiraLAX once to twice a day Take Dulcolax 1 tablet daily Use rectal suppository as needed Given a 1500-calorie high-fiber meal plan Follow-up with primary care team may benefit from rectal manometry he also has a history of colonic polyps so may need repeat colonoscopy before  that.          Objective   Physical Exam:  Blood pressure 122/81, pulse 71, temperature 97.7 F (36.5 C), height 5' 11.5 (1.816 m), weight 271 lb (122.9 kg), SpO2 99%. Body mass index is 37.27 kg/m.  General: He is overweight, cooperative, alert, well developed, and in no acute distress. PSYCH: Has normal mood, affect and thought process.   HEENT: EOMI, sclerae are anicteric. Lungs: Normal breathing effort, no conversational dyspnea. Extremities: No edema.  Neurologic: No gross sensory or motor deficits. No tremors or fasciculations noted.    Diagnostic Data Reviewed:  BMET    Component Value Date/Time   NA 140 03/18/2024 1306   NA 138 03/06/2024 1423   K 4.5 03/18/2024 1306   CL 103 03/18/2024 1306   CO2 23 03/18/2024 1306   GLUCOSE 121 (H) 03/18/2024 1306   BUN 19 03/18/2024 1306   BUN 7 (L) 03/06/2024 1423   CREATININE 1.02 03/18/2024 1306   CALCIUM  9.7 03/18/2024 1306   GFRNONAA >60 03/18/2024 1306   Lab Results  Component Value Date   HGBA1C 5.8 (H) 02/21/2024   HGBA1C 5.5 02/19/2018   Lab Results  Component Value Date   INSULIN  37.9 (H) 02/21/2024   Lab Results  Component Value Date   TSH 2.500 02/21/2024   CBC    Component Value Date/Time   WBC 14.5 (H) 03/18/2024 1306   RBC 5.12 03/18/2024 1306   HGB 15.7 03/18/2024 1306   HGB 15.8 02/21/2024 1036   HCT 46.3 03/18/2024 1306   HCT 47.3 02/21/2024 1036   PLT PLATELET CLUMPS NOTED ON SMEAR, UNABLE TO ESTIMATE 03/18/2024 1306   PLT 245 02/21/2024 1036   MCV 90.4 03/18/2024 1306   MCV 93 02/21/2024 1036   MCH 30.7 03/18/2024 1306   MCHC 33.9 03/18/2024 1306   RDW 12.8 03/18/2024 1306   RDW 12.5 02/21/2024 1036   Iron Studies No results found for: IRON, TIBC, FERRITIN, IRONPCTSAT Lipid Panel     Component Value Date/Time   CHOL 78 (L) 02/21/2024 1036   TRIG 134 02/21/2024 1036   HDL 34 (L) 02/21/2024 1036   CHOLHDL 2.3 05/09/2023 1007   CHOLHDL 3 09/13/2022 0952   VLDL 27.0  09/13/2022 0952   LDLCALC 21 02/21/2024 1036   Hepatic Function Panel     Component Value Date/Time   PROT 7.3 03/18/2024 1306   PROT 6.7 03/06/2024 1423   ALBUMIN 4.7 03/18/2024 1306   ALBUMIN 4.5 03/06/2024 1423   AST 41 03/18/2024 1306   ALT 40 03/18/2024 1306   ALKPHOS 86 03/18/2024 1306   BILITOT 0.7 03/18/2024 1306   BILITOT 0.5 03/06/2024 1423   BILIDIR 0.19 05/09/2023 1007      Component Value Date/Time   TSH 2.500 02/21/2024 1036   Nutritional Lab Results  Component Value Date   VD25OH 17.7 (L) 02/21/2024    Medications: Outpatient Encounter Medications as of 06/04/2024  Medication Sig   levocetirizine (XYZAL ) 5 MG tablet TAKE 1 TABLET BY MOUTH EVERY DAY IN THE EVENING   levothyroxine  (SYNTHROID ) 137 MCG tablet TAKE 1 TABLET BY MOUTH DAILY BEFORE BREAKFAST.   losartan  (COZAAR ) 100 MG tablet Take 1 tablet (100 mg total) by mouth daily.   rosuvastatin  (CRESTOR ) 40 MG tablet Take 1 tablet (40 mg total) by mouth daily.   Vitamin D , Ergocalciferol , (DRISDOL ) 1.25 MG (50000 UNIT) CAPS capsule Take 1 capsule (50,000 Units total) by mouth every 7 (seven) days.   No facility-administered encounter medications on file as of 06/04/2024.     Follow-Up   Return in about 4 weeks (around 07/02/2024) for For Weight Mangement with Dr. Francyne.SABRA He was informed of the importance of frequent follow up visits to maximize his success with intensive lifestyle modifications for his multiple health conditions.  Attestation Statement   Reviewed by clinician on day of visit: allergies, medications, problem list, medical history, surgical history, family history, social history, and previous encounter notes.     Lucas Francyne, MD

## 2024-06-04 NOTE — Assessment & Plan Note (Signed)
 Most recent A1c is  Lab Results  Component Value Date   HGBA1C 5.8 (H) 02/21/2024   HGBA1C 5.5 02/19/2018   Previous A1c of 6.  Patient aware of disease state and risk of progression. This may contribute to abnormal cravings, fatigue and diabetic complications without having diabetes.   We have discussed treatment options which include: losing 7 to 10% of body weight, increasing physical activity to a goal of 150 minutes a week at moderate intensity.  Advised to maintain a diet low on simple and processed carbohydrates.  Now that his bicarbonate is back to normal we may consider starting metformin for pharmacoprophylaxis.

## 2024-06-04 NOTE — Assessment & Plan Note (Signed)
 Vitals:   06/04/24 0800 06/04/24 0901 06/04/24 0903  BP: (!) 127/91 (!) 127/91 122/81    Blood pressure is at goal for age and risk category.  On losartan  100 mg once a day without adverse effects.  Most recent renal parameters reviewed which showed stable electrolytes and kidney function.  Continue with weight loss therapy. Losing 10% may improve blood pressure control. Monitor for symptoms of orthostasis while losing weight. Continue current regimen and home monitoring for a goal blood pressure of < 120/80.

## 2024-06-04 NOTE — Assessment & Plan Note (Signed)
 Patient  report significant straining and pain with defecation at times.  He has a history of colonic polyps.  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  He has an appointment scheduled with GI Continue to use MiraLAX once to twice a day Take Dulcolax 1 tablet daily Use rectal suppository as needed Given a 1500-calorie high-fiber meal plan Follow-up with primary care team may benefit from rectal manometry he also has a history of colonic polyps so may need repeat colonoscopy before that.

## 2024-06-04 NOTE — Assessment & Plan Note (Signed)
 On CPAP losing 15% of body weight may reduce AHI.  He is not a candidate for GLP-1 due to significant problems with constipation.

## 2024-06-04 NOTE — Assessment & Plan Note (Signed)
 He has lost approximately 31 pounds over six months, nearing a 10% weight loss. He is following a 1500 calorie nutrition plan 60% of the time, exercising five days a week, and walking 5000 to 7000 steps daily. The goal is to reach 240 pounds, with a long-term goal of 220 pounds. Current weight is the lowest in a long time, and he is pleased with the progress. - Continue 1500 calorie nutrition plan with a focus on high fiber and moderate protein - Encourage continuation of physical activity, including walking and light weight lifting - Reassess weight and progress in four weeks

## 2024-06-13 ENCOUNTER — Encounter: Payer: Self-pay | Admitting: Medical

## 2024-06-14 ENCOUNTER — Encounter: Payer: Self-pay | Admitting: Medical

## 2024-06-14 ENCOUNTER — Ambulatory Visit
Admission: EM | Admit: 2024-06-14 | Discharge: 2024-06-14 | Disposition: A | Discharge status: Home / Self Care | Attending: Family Medicine | Admitting: Family Medicine

## 2024-06-14 ENCOUNTER — Telehealth: Payer: Self-pay | Admitting: Medical

## 2024-06-14 DIAGNOSIS — R9431 Abnormal electrocardiogram [ECG] [EKG]: Secondary | ICD-10-CM

## 2024-06-14 NOTE — Telephone Encounter (Signed)
 Spoke with pt, he got an alert on his fit bit that he had several episodes of a fib on Wednesday. He did not have any symptoms and his medical doctor advised him to get an ECG. The ECG at urgent care was normal. He was advised he needed to get a monitor and be seen next week. He did not have a problem last night and he feels fine. Follow up scheduled

## 2024-06-14 NOTE — ED Provider Notes (Signed)
 [facility]  Note:  This document was prepared using Conservation officer, historic buildings and may include unintentional dictation errors.  MRN: [id number] DOB: 1950/12/02  Subjective:   Jackson French is a 73 y.o. male presenting for concerns for atrial fibrillation. Patient was alerted by his watch this morning about an abnormal rhythm with a fluctuating heart rate between 40bpm-140bpm. Denies chest pain, shob, n/v, abdominal pain, diaphoresis, heart racing and palpitations, confusion, headache. He does have a cardiologist but their office is closed today.   No current facility-administered medications for this encounter.  Current Outpatient Medications:    levocetirizine (XYZAL ) 5 MG tablet, TAKE 1 TABLET BY MOUTH EVERY DAY IN THE EVENING, Disp: 90 tablet, Rfl: 0   levothyroxine  (SYNTHROID ) 137 MCG tablet, TAKE 1 TABLET BY MOUTH DAILY BEFORE BREAKFAST., Disp: 90 tablet, Rfl: 0   losartan  (COZAAR ) 100 MG tablet, Take 1 tablet (100 mg total) by mouth daily., Disp: 90 tablet, Rfl: 3   rosuvastatin  (CRESTOR ) 40 MG tablet, Take 1 tablet (40 mg total) by mouth daily., Disp: 90 tablet, Rfl: 2   Vitamin D , Ergocalciferol , (DRISDOL ) 1.25 MG (50000 UNIT) CAPS capsule, Take 1 capsule (50,000 Units total) by mouth every 7 (seven) days., Disp: 16 capsule, Rfl: 0   No Known Allergies  Past Medical History:  Diagnosis Date   Allergy    Cataract removed IOLs ou   Chronic constipation    Dyslipidemia    Elevated PSA    GERD (gastroesophageal reflux disease) treated by gastro   Gout    Hyperlipidemia    Hypertension    Hyperthyroidism    Obese    Sleep apnea    no CPAP   Thoracic aortic aneurysm    Thyroid  disease      Past Surgical History:  Procedure Laterality Date   APPENDECTOMY     CATARACT EXTRACTION, BILATERAL Bilateral    COLONOSCOPY  04/19/2022   EYE SURGERY     FRACTURE SURGERY     HYDROCELE EXCISION / REPAIR     THYROID  SURGERY     TONSILLECTOMY      VASECTOMY      Family History  Problem Relation Age of Onset   Arthritis Mother    Breast cancer Mother    Stroke Mother    Stomach cancer Father 29       duodenal cancer   Heart attack Father    Diabetes Father    Hypertension Father    Hyperlipidemia Father    Cancer Father    Sleep apnea Father    Obesity Father    Cancer - Colon Neg Hx    Colon cancer Neg Hx    Rectal cancer Neg Hx    Esophageal cancer Neg Hx     Social History   Tobacco Use   Smoking status: Never   Smokeless tobacco: Never  Vaping Use   Vaping status: Never Used  Substance Use Topics   Alcohol use: Yes    Alcohol/week: 2.0 standard drinks of alcohol    Types: 2 Glasses of wine per week    Comment: rarely   Drug use: Never    ROS   Objective:   Vitals: BP 117/80 (BP Location: Left Arm)   Temp 98.1 F (36.7 C) (Oral)   Resp 16   SpO2 96%   Physical Exam Constitutional:      General: He is not in acute distress.    Appearance: Normal appearance. He is well-developed. He is  not ill-appearing, toxic-appearing or diaphoretic.  HENT:     Head: Normocephalic and atraumatic.     Right Ear: External ear normal.     Left Ear: External ear normal.     Nose: Nose normal.     Mouth/Throat:     Mouth: Mucous membranes are moist.  Eyes:     General: No scleral icterus.       Right eye: No discharge.        Left eye: No discharge.     Extraocular Movements: Extraocular movements intact.  Neck:     Vascular: No carotid bruit.  Cardiovascular:     Rate and Rhythm: Normal rate and regular rhythm.     Heart sounds: Normal heart sounds. No murmur heard.    No friction rub. No gallop.  Pulmonary:     Effort: Pulmonary effort is normal. No respiratory distress.     Breath sounds: Normal breath sounds. No stridor. No wheezing, rhonchi or rales.  Neurological:     Mental Status: He is alert and oriented to person, place, and time.     Cranial Nerves: No cranial nerve deficit.     Motor: No  weakness.     Coordination: Coordination normal.     Gait: Gait normal.  Psychiatric:        Mood and Affect: Mood normal.        Behavior: Behavior normal.        Thought Content: Thought content normal.        Judgment: Judgment normal.     ED ECG REPORT   Date: 06/14/2024  EKG Time: 9:17 AM  Rate: 65bpm  Rhythm: normal sinus rhythm,  unchanged from previous tracings  Axis: normal  Intervals:none  ST&T Change: t-wave flattening in aVL, V4-V6; t-wave inversion in V2-V3.  Narrative Interpretation: Sinus rhythm at 65bpm with non-specific t-wave changes as above. Very comparable to the previous ecg.    Assessment and Plan :   PDMP not reviewed this encounter.  1. Abnormal ECG    No signs of atrial fibrillation. EKG is not normal but unchanged from prior ecg's. Recommended urgent follow up with his PCP, cardiologist. Maintain strict ER precautions.    Christopher Savannah, PA-C 06/14/24 1044

## 2024-06-14 NOTE — Discharge Instructions (Addendum)
 Please follow up with your cardiologist or PCP urgently. Today, in clinic we did not observe atrial fibrillation with our EKG. If you develop chest pain, shortness of breath, confusion, then please go to the emergency room.

## 2024-06-14 NOTE — Telephone Encounter (Signed)
 Pt went to Urgent care and had EKG done per Primary Care . Pt Pcp office would like to see if pt could get a ZIO monitor please advise

## 2024-06-14 NOTE — ED Triage Notes (Signed)
 Pt reports his heart rate was between 41-176 last night. Reports he feels lightheaded is a common thing. Pt was contacted by his PCP and needs a EKG. Pt denies chest pain, headache, vision, nausea, vomiting, palpitations.

## 2024-06-16 ENCOUNTER — Other Ambulatory Visit (INDEPENDENT_AMBULATORY_CARE_PROVIDER_SITE_OTHER): Payer: Self-pay | Admitting: Internal Medicine

## 2024-06-16 DIAGNOSIS — E559 Vitamin D deficiency, unspecified: Secondary | ICD-10-CM

## 2024-06-18 ENCOUNTER — Encounter: Payer: Self-pay | Admitting: Physician Assistant

## 2024-06-18 ENCOUNTER — Ambulatory Visit (INDEPENDENT_AMBULATORY_CARE_PROVIDER_SITE_OTHER): Admitting: Physician Assistant

## 2024-06-18 ENCOUNTER — Ambulatory Visit: Attending: Physician Assistant | Admitting: Physician Assistant

## 2024-06-18 ENCOUNTER — Ambulatory Visit

## 2024-06-18 VITALS — BP 134/84 | HR 77 | Ht 71.0 in | Wt 276.1 lb

## 2024-06-18 VITALS — BP 132/82 | HR 88 | Ht 72.0 in | Wt 278.0 lb

## 2024-06-18 DIAGNOSIS — Z860100 Personal history of colon polyps, unspecified: Secondary | ICD-10-CM | POA: Diagnosis not present

## 2024-06-18 DIAGNOSIS — E785 Hyperlipidemia, unspecified: Secondary | ICD-10-CM | POA: Diagnosis present

## 2024-06-18 DIAGNOSIS — K5909 Other constipation: Secondary | ICD-10-CM

## 2024-06-18 DIAGNOSIS — R009 Unspecified abnormalities of heart beat: Secondary | ICD-10-CM | POA: Insufficient documentation

## 2024-06-18 DIAGNOSIS — I4891 Unspecified atrial fibrillation: Secondary | ICD-10-CM

## 2024-06-18 DIAGNOSIS — I10 Essential (primary) hypertension: Secondary | ICD-10-CM | POA: Insufficient documentation

## 2024-06-18 DIAGNOSIS — I2510 Atherosclerotic heart disease of native coronary artery without angina pectoris: Secondary | ICD-10-CM | POA: Diagnosis not present

## 2024-06-18 NOTE — Progress Notes (Unsigned)
  Cardiology Office Note   Date:  06/18/2024  ID:  Jackson French, DOB [birth year], MRN [id number] PCP: Dorina Dallas RIGGERS  [facility] Providers Cardiologist:  Redell Shallow, MD { Click to update primary MD,subspecialty MD or APP then REFRESH:1}    History of Present Illness Jackson French is a 73 y.o. male with PMH of CAD, HTN, HLD and bradycardia.  Coronary CTA in August 2024 showed calcium  score of 29.4 which places patient at 44th percentile.  Total plaque volume 106 with mild disease in the mid LAD and minimal disease in the RCA and OM1, ascending aorta dilated at 48 x 46 mm.  ABI in August 2024 was normal.  CTA in February 2025 showed ascending thoracic aneurysm measuring at 4.6 x 4.7 cm, stable dilatation of the aortic root measuring 4.5 x 4.7 cm.  Patient was last seen by Dr. Shallow in May 2025 at which time he was doing well.  Blood pressure is elevated, losartan  increased to 100 mg daily.  Abdominal ultrasound showed no AAA.  Follow-up CTA of the chest aorta obtained 03/12/2024 showed stable dilated aortic root measuring at 4.5-4.6 cm at the level of sinuses of Valsalva, ascending thoracic aorta measuring at 4.6 number.  More recently, patient was seen at the urgent care on 06/14/2024 due to concern for atrial fibrillation.  He was alerted by his watch that morning about abnormal rhythm and the fluctuating heart rate between 40 bpm to 140 bpm.  EKG obtained in the urgent care did not show any atrial fibrillation.  Patient was subsequently referred to cardiology service.  According to the patient, he was alerted by his Fitbit of possible A-fib around 06/13/2024, it has not happened since.  He was completely asymptomatic at that day.  He went to urgent care the following morning, EKG at the time showed normal sinus rhythm.  Given asymptomatic nature, I am not sure if he is going in and out of A-fib.  I will order a 2-week ZIO monitor to further assess.  If the ZIO monitor does not show  any evidence of A-fib, he can follow-up with Dr. Shallow in 6 months.  He is due for repeat a CTA of the chest near the end of February.  Otherwise he has no chest pain or shortness of breath.  He has no lower extremity edema, orthopnea or PND.   ROS: ***  Studies Reviewed      *** Risk Assessment/Calculations {Does this patient have ATRIAL FIBRILLATION?:[phone]}         Physical Exam VS:  BP 132/82   Pulse 88   Ht 6' (1.829 m)   Wt 278 lb (126.1 kg)   SpO2 98%   BMI 37.70 kg/m        Wt Readings from Last 3 Encounters:  06/18/24 278 lb (126.1 kg)  06/18/24 276 lb 2 oz (125.2 kg)  06/04/24 271 lb (122.9 kg)    GEN: Well nourished, well developed in no acute distress NECK: No JVD; No carotid bruits CARDIAC: ***RRR, no murmurs, rubs, gallops RESPIRATORY:  Clear to auscultation without rales, wheezing or rhonchi  ABDOMEN: Soft, non-tender, non-distended EXTREMITIES:  No edema; No deformity   ASSESSMENT AND PLAN ***    {Are you ordering a CV Procedure (e.g. stress test, cath, DCCV, TEE, etc)?   Press F2        :[id number]}  Dispo: ***  Signed, Scot Ford, PA

## 2024-06-18 NOTE — Progress Notes (Signed)
 Ellouise Console, PA-C [address] [place], [place]  [address] Phone: [phone]   Primary Care Physician: Dorina Loving, [place]  Primary Gastroenterologist:  Ellouise Console, PA-C / Dr. Gordy Starch   Chief Complaint: Follow-up constipation      HPI:   Discussed the use of AI scribe software for clinical note transcription with the patient, who gave verbal consent to proceed. History of Present Illness Jackson French is a 73 year old male with a history of colon polyps and aortic aneurysm who presents with chronic constipation.  He has a history of colon polyps, with 13 polyps removed during a colonoscopy in 2022 and 3 polyps removed in 2023.  He experiences chronic constipation, which worsened significantly in August, leading to an emergency room visit 03/18/24 due to fecal impaction. Initially, he managed his symptoms with Miralax and Dulcolax, but experienced severe diarrhea following Dulcolax use. Subsequent episodes of constipation were managed with Miralax flushes, which provided relief. Since July, he has been on a high protein and fiber diet, resulting in a weight loss of 30 to 40 pounds. He takes Miralax once daily and has incorporated more vegetables and fiber into his diet. He reports normal bowel movements over the past few weeks and uses a teaspoon of extra virgin olive oil in his morning coffee, which he believes helps his condition.  He has a history of an aortic chest aneurysm, which causes him anxiety, particularly when straining during bowel movements. He has had three CT scans showing no progression of the aneurysm.  He has a history of thyroidectomy and takes Synthroid . He also takes Cozaar  for blood pressure management. He experienced an alert for irregular heart rate and possible AFib from his Fitbit, leading to a normal EKG and a follow-up appointment with a cardiology PA in the near future.  He has been diagnosed with sleep apnea in the past but does not  currently use a CPAP. His wife reports a reduction in snoring since his weight loss.  04/2022 colonoscopy by Dr. Starch: 3 tubular adenoma polyps removed ranging in size from 3 to 6 mm.  3-year repeat (due 04/2025).  04/2021 colonoscopy by Dr. Starch: 13 tubular adenoma polyps removed ranging in size from 4 mm to 18 mm.  No dysplasia.    Current Outpatient Medications  Medication Sig Dispense Refill   levocetirizine (XYZAL ) 5 MG tablet TAKE 1 TABLET BY MOUTH EVERY DAY IN THE EVENING 90 tablet 0   levothyroxine  (SYNTHROID ) 137 MCG tablet TAKE 1 TABLET BY MOUTH DAILY BEFORE BREAKFAST. 90 tablet 0   losartan  (COZAAR ) 100 MG tablet Take 1 tablet (100 mg total) by mouth daily. 90 tablet 3   polyethylene glycol (MIRALAX / GLYCOLAX) 17 g packet Take 17 g by mouth daily.     rosuvastatin  (CRESTOR ) 40 MG tablet Take 1 tablet (40 mg total) by mouth daily. 90 tablet 2   Vitamin D , Ergocalciferol , (DRISDOL ) 1.25 MG (50000 UNIT) CAPS capsule Take 1 capsule (50,000 Units total) by mouth every 7 (seven) days. 16 capsule 0   No current facility-administered medications for this visit.    Allergies as of 06/18/2024   (No Known Allergies)    Past Medical History:  Diagnosis Date   Allergy    Cataract removed IOLs ou   Chronic constipation    Dyslipidemia    Elevated PSA    GERD (gastroesophageal reflux disease) treated by gastro   Gout    Hyperlipidemia    Hypertension  Hyperthyroidism    Obese    Sleep apnea    no CPAP   Thoracic aortic aneurysm    Thyroid  disease     Past Surgical History:  Procedure Laterality Date   APPENDECTOMY     CATARACT EXTRACTION, BILATERAL Bilateral    COLONOSCOPY  04/19/2022   EYE SURGERY     FRACTURE SURGERY     HYDROCELE EXCISION / REPAIR     THYROID  SURGERY     TONSILLECTOMY     VASECTOMY      Review of Systems:    All systems reviewed and negative except where noted in HPI.    Physical Exam:  BP 134/84   Pulse 77   Ht 5' 11 (1.803 m)   Wt  276 lb 2 oz (125.2 kg)   SpO2 99%   BMI 38.51 kg/m  No LMP for male patient.  General: Well-nourished, well-developed, obese, in no acute distress.  Lungs: Clear to auscultation bilaterally. Non-labored. Heart: Regular rate and rhythm, no murmurs rubs or gallops.  Abdomen: Bowel sounds are normal; Abdomen is Soft; No hepatosplenomegaly, masses or hernias;  No Abdominal Tenderness; No guarding or rebound tenderness. Neuro: Alert and oriented x 3.  Grossly intact.  Psych: Alert and cooperative, normal mood and affect.   Imaging Studies: No results found.  Labs: CBC    Component Value Date/Time   WBC 14.5 (H) 03/18/2024 1306   RBC 5.12 03/18/2024 1306   HGB 15.7 03/18/2024 1306   HGB 15.8 02/21/2024 1036   HCT 46.3 03/18/2024 1306   HCT 47.3 02/21/2024 1036   PLT PLATELET CLUMPS NOTED ON SMEAR, UNABLE TO ESTIMATE 03/18/2024 1306   PLT 245 02/21/2024 1036   MCV 90.4 03/18/2024 1306   MCV 93 02/21/2024 1036   MCH 30.7 03/18/2024 1306   MCHC 33.9 03/18/2024 1306   RDW 12.8 03/18/2024 1306   RDW 12.5 02/21/2024 1036   LYMPHSABS 1.6 03/13/2024 1446   LYMPHSABS 1.4 02/21/2024 1036   MONOABS 0.8 03/13/2024 1446   EOSABS 0.5 03/13/2024 1446   EOSABS 0.4 02/21/2024 1036   BASOSABS 0.1 03/13/2024 1446   BASOSABS 0.0 02/21/2024 1036    CMP     Component Value Date/Time   NA 140 03/18/2024 1306   NA 138 03/06/2024 1423   K 4.5 03/18/2024 1306   CL 103 03/18/2024 1306   CO2 23 03/18/2024 1306   GLUCOSE 121 (H) 03/18/2024 1306   BUN 19 03/18/2024 1306   BUN 7 (L) 03/06/2024 1423   CREATININE 1.02 03/18/2024 1306   CALCIUM  9.7 03/18/2024 1306   PROT 7.3 03/18/2024 1306   PROT 6.7 03/06/2024 1423   ALBUMIN 4.7 03/18/2024 1306   ALBUMIN 4.5 03/06/2024 1423   AST 41 03/18/2024 1306   ALT 40 03/18/2024 1306   ALKPHOS 86 03/18/2024 1306   BILITOT 0.7 03/18/2024 1306   BILITOT 0.5 03/06/2024 1423   GFRNONAA >60 03/18/2024 1306     Assessment and Plan:   Jackson French  is a 73 y.o. y/o male presents for follow-up of: Assessment & Plan Chronic constipation Episodes of severe impaction improved with dietary changes and Miralax. Concerns about straining due to abdominal aortic aneurysm. Miralax deemed safe for long-term use. Linzess discussed as alternative if needed. - Continue Miralax once daily, adjust dose based on bowel habits. - Stressed the importance of taking daily consistent MiraLAX treatment. - Consider Linzess if Miralax is ineffective. - Use Miralax flush for acute severe constipation. - Increase dietary fiber and  fluid intake. - Recommend 64 ounces of water daily and 30 g of fiber daily. - Monitor bowel habits and adjust treatment as needed. - We also discussed other treatments for constipation including Amitiza, Trulance, Ibsrela if needed in the future. - Continue to follow-up with PCP to monitor thyroid  medication to ensure therapeutic dose.  Abdominal aortic aneurysm, stable No change in aneurysm size on recent CT. Concerns about straining during bowel movements addressed. Followed by cardiologist Dr. Pietro. - Continue monitoring with cardiologist. - Avoid straining during bowel movements.  Personal history of multiple colonic tubular adenomas Multiple adenomas with no dysplasia. Regular colonoscopy screenings emphasized due to history of polyps. - 3-year repeat colonoscopy will be due September 2026. - Continue regular colonoscopy screenings until age 37.    Ellouise Console, PA-C  Follow up September 2026 for repeat colonoscopy.  Follow-up as needed if recurrent constipation symptoms.

## 2024-06-18 NOTE — Progress Notes (Unsigned)
 Enrolled for [employer] to mail a ZIO XT long term holter monitor to the patients address on file.   Dr. Jens Som to read.

## 2024-06-18 NOTE — Patient Instructions (Addendum)
-    Continue OTC Miralax Powder; Mix 1 capful in 6 to 8 ounces of a drink once daily - Recommend high-fiber diet, 30 g of fiber daily - Eat fruits, vegetables, and whole grains - Drink 64 ounces of water / fluids daily.    Your next Colonoscopy will be due September 2026.  We will reach out to you to schedule next year.  It was very nice to meet you. I am available to help if you need any GI recommendations in the future!  Ellouise Console, PA-C   Please follow up sooner if symptoms increase or worsen  Due to recent changes in healthcare laws, you may see the results of your imaging and laboratory studies on MyChart before your provider has had a chance to review them.  We understand that in some cases there may be results that are confusing or concerning to you. Not all laboratory results come back in the same time frame and the provider may be waiting for multiple results in order to interpret others.  Please give us  48 hours in order for your provider to thoroughly review all the results before contacting the office for clarification of your results.   Thank you for trusting me with your gastrointestinal care!  _______________________________________________________  If your blood pressure at your visit was 140/90 or greater, please contact your primary care physician to follow up on this.  _______________________________________________________  If you are age 78 or older, your body mass index should be between 23-30. Your Body mass index is 38.51 kg/m. If this is out of the aforementioned range listed, please consider follow up with your Primary Care Provider.  If you are age 75 or younger, your body mass index should be between 19-25. Your Body mass index is 38.51 kg/m. If this is out of the aformentioned range listed, please consider follow up with your Primary Care Provider.   ________________________________________________________  The [facility] providers would like to encourage  you to use MYCHART to communicate with providers for non-urgent requests or questions.  Due to long hold times on the telephone, sending your provider a message by [facility] may be a faster and more efficient way to get a response.  Please allow 48 business hours for a response.  Please remember that this is for non-urgent requests.  _______________________________________________________

## 2024-06-18 NOTE — Patient Instructions (Signed)
 Medication Instructions:  Your physician recommends that you continue on your current medications as directed. Please refer to the Current Medication list given to you today.  *If you need a refill on your cardiac medications before your next appointment, please call your pharmacy*  Lab Work: NONE If you have labs (blood work) drawn today and your tests are completely normal, you will receive your results only by: MyChart Message (if you have MyChart) OR A paper copy in the mail If you have any lab test that is abnormal or we need to change your treatment, we will call you to review the results.  Testing/Procedures: GEOFFRY HEWS- Long Term Monitor Instructions  Your physician has requested you wear a ZIO patch monitor for 14 days.  This is a single patch monitor. [employer] supplies one patch monitor per enrollment. Additional stickers are not available. Please do not apply patch if you will be having a Nuclear Stress Test,  Echocardiogram, Cardiac CT, MRI, or Chest Xray during the period you would be wearing the  monitor. The patch cannot be worn during these tests. You cannot remove and re-apply the  ZIO XT patch monitor.  Your ZIO patch monitor will be mailed 3 day USPS to your address on file. It may take 3-5 days  to receive your monitor after you have been enrolled.  Once you have received your monitor, please review the enclosed instructions. Your monitor  has already been registered assigning a specific monitor serial # to you.  Billing and Patient Assistance Program Information  We have supplied [employer] with any of your insurance information on file for billing purposes. [employer] offers a sliding scale Patient Assistance Program for patients that do not have  insurance, or whose insurance does not completely cover the cost of the ZIO monitor.  You must apply for the Patient Assistance Program to qualify for this discounted rate.  To apply, please call [employer] at [phone], select  option 4, select option 2, ask to apply for  Patient Assistance Program. Meredeth will ask your household income, and how many people  are in your household. They will quote your out-of-pocket cost based on that information.  [employer] will also be able to set up a 5-month, interest-free payment plan if needed.  Applying the monitor   Shave hair from upper left chest.  Hold abrader disc by orange tab. Rub abrader in 40 strokes over the upper left chest as  indicated in your monitor instructions.  Clean area with 4 enclosed alcohol pads. Let dry.  Apply patch as indicated in monitor instructions. Patch will be placed under collarbone on left  side of chest with arrow pointing upward.  Rub patch adhesive wings for 2 minutes. Remove white label marked 1. Remove the white  label marked 2. Rub patch adhesive wings for 2 additional minutes.  While looking in a mirror, press and release button in center of patch. A small green light will  flash 3-4 times. This will be your only indicator that the monitor has been turned on.  Do not shower for the first 24 hours. You may shower after the first 24 hours.  Press the button if you feel a symptom. You will hear a small click. Record Date, Time and  Symptom in the Patient Logbook.  When you are ready to remove the patch, follow instructions on the last 2 pages of Patient  Logbook. Stick patch monitor onto the last page of Patient Logbook.  Place Patient Logbook in the blue  and white box. Use locking tab on box and tape box closed  securely. The blue and white box has prepaid postage on it. Please place it in the mailbox as  soon as possible. Your physician should have your test results approximately 7 days after the  monitor has been mailed back to [facility].  Call [facility] Customer Care at [phone] if you have questions regarding  your ZIO XT patch monitor. Call them immediately if you see an orange light blinking on your  monitor.   If your monitor falls off in less than 4 days, contact our Monitor department at [phone].  If your monitor becomes loose or falls off after 4 days call [employer] at [phone] for  suggestions on securing your monitor   Follow-Up: At [facility], you and your health needs are our priority.  As part of our continuing mission to provide you with exceptional heart care, our providers are all part of one team.  This team includes your primary Cardiologist (physician) and Advanced Practice Providers or APPs (Physician Assistants and Nurse Practitioners) who all work together to provide you with the care you need, when you need it.  Your next appointment:   6 month(s)  Provider:   Redell Shallow, MD  We recommend signing up for the patient portal called MyChart.  Sign up information is provided on this After Visit Summary.  MyChart is used to connect with patients for Virtual Visits (Telemedicine).  Patients are able to view lab/test results, encounter notes, upcoming appointments, etc.  Non-urgent messages can be sent to your provider as well.   To learn more about what you can do with MyChart, go to [email or web address].

## 2024-07-10 ENCOUNTER — Ambulatory Visit: Payer: Self-pay | Admitting: Physician Assistant

## 2024-07-10 DIAGNOSIS — I4891 Unspecified atrial fibrillation: Secondary | ICD-10-CM

## 2024-07-16 ENCOUNTER — Encounter (INDEPENDENT_AMBULATORY_CARE_PROVIDER_SITE_OTHER): Payer: Self-pay | Admitting: Internal Medicine

## 2024-07-16 ENCOUNTER — Ambulatory Visit (INDEPENDENT_AMBULATORY_CARE_PROVIDER_SITE_OTHER): Payer: Self-pay | Admitting: Internal Medicine

## 2024-07-16 VITALS — BP 120/80 | HR 74 | Temp 98.1°F | Ht 71.5 in | Wt 267.0 lb

## 2024-07-16 DIAGNOSIS — R7303 Prediabetes: Secondary | ICD-10-CM | POA: Diagnosis not present

## 2024-07-16 DIAGNOSIS — K5909 Other constipation: Secondary | ICD-10-CM | POA: Diagnosis not present

## 2024-07-16 DIAGNOSIS — G4733 Obstructive sleep apnea (adult) (pediatric): Secondary | ICD-10-CM

## 2024-07-16 DIAGNOSIS — E66813 Obesity, class 3: Secondary | ICD-10-CM | POA: Diagnosis not present

## 2024-07-16 DIAGNOSIS — I10 Essential (primary) hypertension: Secondary | ICD-10-CM

## 2024-07-16 DIAGNOSIS — Z6841 Body Mass Index (BMI) 40.0 and over, adult: Secondary | ICD-10-CM | POA: Diagnosis not present

## 2024-07-16 DIAGNOSIS — E88819 Insulin resistance, unspecified: Secondary | ICD-10-CM

## 2024-07-16 NOTE — Assessment & Plan Note (Signed)
 His HOMA-IR is 9.3 which is elevated. Optimal level < 1.9.   This is complex condition associated with genetics, ectopic fat and lifestyle factors. Insulin  resistance may result in increased fat storage, inhibition of the breakdown of fat, cause fluctuations in blood sugar leading to energy crashes and increased cravings for sugary or high carb foods and cause metabolic slowdown making it difficult to lose weight.  This may result in additional weight gain and lead to pre-diabetes and diabetes if untreated. In addition, hyperinsulinemia increases cardiovascular risk, chronic inflammatory response and may increase the risk of obesity related malignancies.  Lab Results  Component Value Date   HGBA1C 5.8 (H) 02/21/2024   Lab Results  Component Value Date   INSULIN  37.9 (H) 02/21/2024   Lab Results  Component Value Date   GLUCOSE 121 (H) 03/18/2024   GLUCOSE 116 (H) 02/15/2018    We reviewed treatment options which include losing 7 to 10% of body weight, increasing volume of physical activity and maintaining a diet low in saturated fats and with a low glycemic load.  Patient has also been educated on the carb insulin  model of obesity.  We will hold off metformin because of these episodes of decreased bicarbonate.  Also because of his constipation I do not think he is a good candidate for GLP-1 at present time.

## 2024-07-16 NOTE — Assessment & Plan Note (Signed)
 Improved with daily Miralax, dietary fiber, and water intake. He reports no current issues with constipation. - Continue daily Miralax. - Maintain dietary fiber intake.

## 2024-07-16 NOTE — Assessment & Plan Note (Signed)
 Vitals:   07/16/24 1000  BP: 120/80    Blood pressure is at goal for age and risk category.  On losartan  100 mg once a day without adverse effects.  Most recent renal parameters reviewed which showed stable electrolytes and kidney function.  Continue with weight loss therapy. Losing 10% may improve blood pressure control. Monitor for symptoms of orthostasis while losing weight. Continue current regimen and home monitoring for a goal blood pressure of < 120/80.

## 2024-07-16 NOTE — Assessment & Plan Note (Signed)
 He has lost 35 pounds over six months, approximately 12% of total body weight, at a healthy rate without muscle loss. He adheres to a 1500 calorie diet and exercises three days a week. He has avoided anti-obesity medications due to effective appetite control and healthy weight loss rate. He aims to reach a weight in the 240s over the next year. - Continue 1500 calorie diet. - Continue exercise three days a week. - Avoid anti-obesity medications. - Provided handout on staying on track during holidays.

## 2024-07-16 NOTE — Progress Notes (Signed)
 Office: [phone]  /  Fax: [phone]  Weight Summary and Body Composition Analysis (BIA)  Vitals Temp: 98.1 F (36.7 C) BP: 120/80 Pulse Rate: 74 SpO2: 98 %   Anthropometric Measurements Height: 5' 11.5 (1.816 m) Weight: 267 lb (121.1 kg) BMI (Calculated): 36.72 Weight at Last Visit: 271 lb Weight Lost Since Last Visit: 4 lb Weight Gained Since Last Visit: 0 lb Starting Weight: 298 lb Total Weight Loss (lbs): 31 lb (14.1 kg) Peak Weight: 297 lb   Body Composition  Body Fat %: 38.7 % Fat Mass (lbs): 103.4 lbs Muscle Mass (lbs): 156 lbs Total Body Water (lbs): 123 lbs Visceral Fat Rating : 25    RMR: 2016  Today's Visit #: 6  Starting Date: 02/21/24   Subjective   Chief Complaint: Obesity  Interval History Discussed the use of AI scribe software for clinical note transcription with the patient, who gave verbal consent to proceed.  History of Present Illness Jackson French is a 73 year old male who presents for medical weight management.  He has lost four pounds since his last visit, achieved through adherence to a 1500 calorie diet and exercising three days a week for 10 to 15 minutes, primarily doing cardio. Over six months, he has lost a total of 35 pounds, approximately 12% of his total body weight. His family noticed his weight loss during Thanksgiving. He follows a structured dietary plan, consuming kefir, coffee, yogurt, peanut butter, and bread for breakfast; vegetables and protein for lunch; and Factor meals for dinner. He supplements with a protein drink if needed and is mindful of his fiber intake to prevent constipation. He uses olive oil in his diet and is not taking any weight loss medications due to concerns about constipation.  On November 6th, he received a notification from his Fitbit indicating an irregular heartbeat, possibly atrial fibrillation. He contacted his primary care provider and was advised to get an EKG, which showed no atrial  fibrillation. He was given a Zio patch to wear for two weeks, which indicated non-sustained ventricular tachycardia and supraventricular tachycardia with the fastest run lasting 10 beats at a rate of 160 bpm. He reports no symptoms during these episodes.  He has a history of constipation, which has improved with daily use of Miralax. He incorporates fiber and water into his diet to manage this issue and reports no current problems with constipation.  He discusses his sleep history, noting that he was diagnosed with sleep apnea approximately 25-30 years ago and used a CPAP for six months before undergoing throat surgery. He does not currently use a CPAP but has purchased a pillow marketed for breathing problems. His wife reports that his snoring has improved. He uses a watch to monitor his sleep patterns, which show a sleep score above 90% most nights.  He takes vitamin D3 supplements, 2000 IU daily. He previously took high-dose vitamin D  but has transitioned to over-the-counter supplements. He also mentions a past recommendation for B12 supplementation.     Challenges affecting patient progress: none.    Pharmacotherapy for weight management: He is currently taking no anti-obesity medication.   Assessment and Plan   Treatment Plan For Obesity:  Recommended Dietary Goals  Jackson French is currently in the action stage of change. As such, his goal is to continue weight management plan. He has agreed to: follow the Category 3 plan - 1500 kcal per day  Behavioral Health and Counseling  We discussed the following behavioral modification strategies today: continue to  work on maintaining a reduced calorie state, getting the recommended amount of protein, incorporating whole foods, making healthy choices, staying well hydrated and practicing mindfulness when eating. and increase protein intake, fibrous foods (25 grams per day for women, 30 grams for men) and water to improve satiety and decrease hunger  signals. .  Additional education and resources provided today: Handout on traveling and holiday eating strategies  Recommended Physical Activity Goals  Jackson French has been advised to work up to 150 minutes of moderate intensity aerobic activity a week and strengthening exercises 2-3 times per week for cardiovascular health, weight loss maintenance and preservation of muscle mass.  He has agreed to :  Start strengthening exercises with a goal of 2-3 sessions a week , Start aerobic activity with a goal of 150 minutes a week at moderate intensity. , and Combine aerobic and strengthening exercises for efficiency and improved cardiometabolic health.  Medical Interventions and Pharmacotherapy  We discussed various medication options to help Jackson French with his weight loss efforts and we both agreed to : Continue with current nutritional and behavioral strategies  Associated Conditions Impacted by Obesity Treatment  Assessment & Plan Chronic constipation Improved with daily Miralax, dietary fiber, and water intake. He reports no current issues with constipation. - Continue daily Miralax. - Maintain dietary fiber intake. Essential hypertension Vitals:   07/16/24 1000  BP: 120/80    Blood pressure is at goal for age and risk category.  On losartan  100 mg once a day without adverse effects.  Most recent renal parameters reviewed which showed stable electrolytes and kidney function.  Continue with weight loss therapy. Losing 10% may improve blood pressure control. Monitor for symptoms of orthostasis while losing weight. Continue current regimen and home monitoring for a goal blood pressure of < 120/80.   OSA (obstructive sleep apnea) Did not tolerate PAP therapy in past, treated with palate surgery. Denies symptoms at present.  Insulin  resistance Prediabetes His HOMA-IR is 9.3 which is elevated. Optimal level < 1.9.   This is complex condition associated with genetics, ectopic fat and lifestyle  factors. Insulin  resistance may result in increased fat storage, inhibition of the breakdown of fat, cause fluctuations in blood sugar leading to energy crashes and increased cravings for sugary or high carb foods and cause metabolic slowdown making it difficult to lose weight.  This may result in additional weight gain and lead to pre-diabetes and diabetes if untreated. In addition, hyperinsulinemia increases cardiovascular risk, chronic inflammatory response and may increase the risk of obesity related malignancies.  Lab Results  Component Value Date   HGBA1C 5.8 (H) 02/21/2024   Lab Results  Component Value Date   INSULIN  37.9 (H) 02/21/2024   Lab Results  Component Value Date   GLUCOSE 121 (H) 03/18/2024   GLUCOSE 116 (H) 02/15/2018    We reviewed treatment options which include losing 7 to 10% of body weight, increasing volume of physical activity and maintaining a diet low in saturated fats and with a low glycemic load.  Patient has also been educated on the carb insulin  model of obesity.  We will hold off metformin because of these episodes of decreased bicarbonate.  Also because of his constipation I do not think he is a good candidate for GLP-1 at present time. Class 3 severe obesity due to excess calories with serious comorbidity and body mass index (BMI) of 40.0 to 44.9 in adult [facility]) He has lost 35 pounds over six months, approximately 12% of total body weight, at  a healthy rate without muscle loss. He adheres to a 1500 calorie diet and exercises three days a week. He has avoided anti-obesity medications due to effective appetite control and healthy weight loss rate. He aims to reach a weight in the 240s over the next year. - Continue 1500 calorie diet. - Continue exercise three days a week. - Avoid anti-obesity medications. - Provided handout on staying on track during holidays.         Objective   Physical Exam:  Blood pressure 120/80, pulse 74, temperature 98.1 F  (36.7 C), height 5' 11.5 (1.816 m), weight 267 lb (121.1 kg), SpO2 98%. Body mass index is 36.72 kg/m.  General: He is overweight, cooperative, alert, well developed, and in no acute distress. PSYCH: Has normal mood, affect and thought process.   HEENT: EOMI, sclerae are anicteric. Lungs: Normal breathing effort, no conversational dyspnea. Extremities: No edema.  Neurologic: No gross sensory or motor deficits. No tremors or fasciculations noted.    Diagnostic Data Reviewed:  BMET    Component Value Date/Time   NA 140 03/18/2024 1306   NA 138 03/06/2024 1423   K 4.5 03/18/2024 1306   CL 103 03/18/2024 1306   CO2 23 03/18/2024 1306   GLUCOSE 121 (H) 03/18/2024 1306   BUN 19 03/18/2024 1306   BUN 7 (L) 03/06/2024 1423   CREATININE 1.02 03/18/2024 1306   CALCIUM  9.7 03/18/2024 1306   GFRNONAA >60 03/18/2024 1306   Lab Results  Component Value Date   HGBA1C 5.8 (H) 02/21/2024   HGBA1C 5.5 02/19/2018   Lab Results  Component Value Date   INSULIN  37.9 (H) 02/21/2024   Lab Results  Component Value Date   TSH 2.500 02/21/2024   CBC    Component Value Date/Time   WBC 14.5 (H) 03/18/2024 1306   RBC 5.12 03/18/2024 1306   HGB 15.7 03/18/2024 1306   HGB 15.8 02/21/2024 1036   HCT 46.3 03/18/2024 1306   HCT 47.3 02/21/2024 1036   PLT PLATELET CLUMPS NOTED ON SMEAR, UNABLE TO ESTIMATE 03/18/2024 1306   PLT 245 02/21/2024 1036   MCV 90.4 03/18/2024 1306   MCV 93 02/21/2024 1036   MCH 30.7 03/18/2024 1306   MCHC 33.9 03/18/2024 1306   RDW 12.8 03/18/2024 1306   RDW 12.5 02/21/2024 1036   Iron Studies No results found for: IRON, TIBC, FERRITIN, IRONPCTSAT Lipid Panel     Component Value Date/Time   CHOL 78 (L) 02/21/2024 1036   TRIG 134 02/21/2024 1036   HDL 34 (L) 02/21/2024 1036   CHOLHDL 2.3 05/09/2023 1007   CHOLHDL 3 09/13/2022 0952   VLDL 27.0 09/13/2022 0952   LDLCALC 21 02/21/2024 1036   Hepatic Function Panel     Component Value Date/Time    PROT 7.3 03/18/2024 1306   PROT 6.7 03/06/2024 1423   ALBUMIN 4.7 03/18/2024 1306   ALBUMIN 4.5 03/06/2024 1423   AST 41 03/18/2024 1306   ALT 40 03/18/2024 1306   ALKPHOS 86 03/18/2024 1306   BILITOT 0.7 03/18/2024 1306   BILITOT 0.5 03/06/2024 1423   BILIDIR 0.19 05/09/2023 1007      Component Value Date/Time   TSH 2.500 02/21/2024 1036   Nutritional Lab Results  Component Value Date   VD25OH 17.7 (L) 02/21/2024    Medications: Outpatient Encounter Medications as of 07/16/2024  Medication Sig   levocetirizine (XYZAL ) 5 MG tablet TAKE 1 TABLET BY MOUTH EVERY DAY IN THE EVENING   levothyroxine  (SYNTHROID ) 137 MCG tablet  TAKE 1 TABLET BY MOUTH DAILY BEFORE BREAKFAST.   losartan  (COZAAR ) 100 MG tablet Take 1 tablet (100 mg total) by mouth daily.   polyethylene glycol (MIRALAX / GLYCOLAX) 17 g packet Take 17 g by mouth daily.   rosuvastatin  (CRESTOR ) 40 MG tablet Take 1 tablet (40 mg total) by mouth daily.   Vitamin D , Ergocalciferol , (DRISDOL ) 1.25 MG (50000 UNIT) CAPS capsule Take 1 capsule (50,000 Units total) by mouth every 7 (seven) days.   No facility-administered encounter medications on file as of 07/16/2024.     Follow-Up   Return in about 4 weeks (around 08/13/2024) for For Weight Mangement with Dr. Francyne.SABRA He was informed of the importance of frequent follow up visits to maximize his success with intensive lifestyle modifications for his multiple health conditions.  Attestation Statement   Reviewed by clinician on day of visit: allergies, medications, problem list, medical history, surgical history, family history, social history, and previous encounter notes.     Lucas Francyne, MD

## 2024-07-16 NOTE — Assessment & Plan Note (Signed)
 Did not tolerate PAP therapy in past, treated with palate surgery. Denies symptoms at present.

## 2024-07-24 ENCOUNTER — Telehealth: Payer: Self-pay | Admitting: Medical

## 2024-07-24 NOTE — Telephone Encounter (Signed)
 Copied from CRM #[id number]. Topic: Medicare AWV >> Jul 24, 2024 10:32 AM Nathanel DEL wrote: Called LVM 07/24/2024 to sched AWV. Please schedule AWV in office only.   Nathanel Paschal; Care Guide Ambulatory Clinical Support [facility] Direct Dial: [phone]

## 2024-07-26 ENCOUNTER — Other Ambulatory Visit: Payer: Self-pay | Admitting: Medical

## 2024-08-15 ENCOUNTER — Ambulatory Visit (INDEPENDENT_AMBULATORY_CARE_PROVIDER_SITE_OTHER): Admitting: Internal Medicine

## 2024-08-15 VITALS — BP 127/81 | HR 76 | Temp 98.2°F | Ht 71.5 in | Wt 267.0 lb

## 2024-08-15 DIAGNOSIS — E88819 Insulin resistance, unspecified: Secondary | ICD-10-CM

## 2024-08-15 DIAGNOSIS — R7303 Prediabetes: Secondary | ICD-10-CM

## 2024-08-15 DIAGNOSIS — E66812 Obesity, class 2: Secondary | ICD-10-CM

## 2024-08-15 DIAGNOSIS — K5909 Other constipation: Secondary | ICD-10-CM

## 2024-08-15 DIAGNOSIS — I712 Thoracic aortic aneurysm, without rupture, unspecified: Secondary | ICD-10-CM | POA: Diagnosis not present

## 2024-08-15 DIAGNOSIS — Z6835 Body mass index (BMI) 35.0-35.9, adult: Secondary | ICD-10-CM

## 2024-08-15 NOTE — Assessment & Plan Note (Signed)
" °  Weight: decrease of 35 lb (11.6%) over 7 months, 2 weeks  Start: 12/27/2023 302 lb (137 kg) (H)  End: 08/15/2024 267 lb (121.1 kg)   "

## 2024-08-15 NOTE — Assessment & Plan Note (Signed)
 He has doing a good job reducing simple and added sugars in his diet and will continue to maintain a low-carb meal plan to stick calories.  We have avoided metformin because he had an unexplained metabolic acidosis in the past this has resolved.  He is interested in avoiding medications and continuing with lifestyle changes alone.  We will be checking disease monitoring labs fasting at the next office visit.

## 2024-08-15 NOTE — Assessment & Plan Note (Signed)
 Improved with regular bowel movements most mornings. He is cautious about straining during bowel movements and uses protein shakes with high fiber content to aid in bowel regularity. - Continue using protein shakes with high fiber content. - Encouraged regular bowel movements without straining.

## 2024-08-15 NOTE — Progress Notes (Signed)
 "  Office: [phone]  /  Fax: [phone]  Weight Summary and Body Composition Analysis (BIA)  Vitals Temp: 98.2 F (36.8 C) BP: 127/81 Pulse Rate: 76 SpO2: 98 %   Anthropometric Measurements Height: 5' 11.5 (1.816 m) Weight: 267 lb (121.1 kg) BMI (Calculated): 36.72 Weight at Last Visit: 267 lb Weight Lost Since Last Visit: 0 lb Weight Gained Since Last Visit: 267 lb Starting Weight: 298 lb Total Weight Loss (lbs): 31 lb (14.1 kg) Peak Weight: 297 lb   Body Composition  Body Fat %: 38.6 % Fat Mass (lbs): 103.2 lbs Muscle Mass (lbs): 156.2 lbs Total Body Water (lbs): 122.6 lbs Visceral Fat Rating : 25    RMR: 2016  Today's Visit #: 7  Starting Date: 02/21/24   Subjective   Chief Complaint: Obesity  Interval History  Discussed the use of AI scribe software for clinical note transcription with the patient, who gave verbal consent to proceed.  History of Present Illness Jackson French is a 74 year old male who presents for medical weight management.  Since last office visit he has maintained over the holidays.  He has been actively participating in a weight management program and has successfully maintained his weight over the holidays, even losing a pound recently. He is mindful of his protein intake and is cautious about consuming protein bars due to their sugar content, which affects his blood sugar levels. He carefully reads labels to avoid high sugar and sodium content in foods.  His daily caloric intake is approximately 1500 calories, utilizing prepackaged meals and protein shakes to manage his diet. He prefers Atkins protein shakes for their high fiber content, which he finds beneficial for bowel movements. He has experienced improved bowel regularity, having trained himself to have a bowel movement most mornings.  He has a history of an aneurysm, which necessitates caution with physical activities, particularly strength training. He is also involved  in caring for his four grandchildren, which impacts his time for exercise.  He experiences increased urination, especially at night, which he attributes to increased water intake. He is concerned about his prostate due to the frequency of urination.  In July, his lab work showed a high insulin  level and an A1c of 5.8.     Challenges affecting patient progress: medical comorbidities.    Pharmacotherapy for weight management: He is currently taking no anti-obesity medication.   Assessment and Plan   Treatment Plan For Obesity:  Recommended Dietary Goals  Jackson French is currently in the action stage of change. As such, his goal is to continue weight management plan. He has agreed to: incorporate prepackaged healthy meals for convenience, incorporate 1-2 meal replacements a day for convenience , and continue current plan  Behavioral Health and Counseling  We discussed the following behavioral modification strategies today: increasing vegetables, increasing fiber rich foods, and work on tracking and journaling calories using tracking application.  Additional education and resources provided today: None  Recommended Physical Activity Goals  Jackson French has been advised to work up to 150 minutes of moderate intensity aerobic activity a week and strengthening exercises 2-3 times per week for cardiovascular health, weight loss maintenance and preservation of muscle mass.  He has agreed to :  Recommend that he incorporate light strengthening exercises into his routine, no heavy lifting or strenuous physical activity..  Medical Interventions and Pharmacotherapy  We discussed various medication options to help Jackson French with his weight loss efforts and we both agreed to : Continue with current nutritional and behavioral  strategies, Has declined pharmacotherapy, and not a good candidate for GLP-1 due to problems with constipation.  Associated Conditions Impacted by Obesity Treatment  Assessment &  Plan Class 2 severe obesity with serious comorbidity and body mass index (BMI) of 35.0 to 35.9 in adult, unspecified obesity type  Weight: decrease of 35 lb (11.6%) over 7 months, 2 weeks  Start: 12/27/2023 302 lb (137 kg) (H)  End: 08/15/2024 267 lb (121.1 kg)   Chronic constipation Improved with regular bowel movements most mornings. He is cautious about straining during bowel movements and uses protein shakes with high fiber content to aid in bowel regularity. - Continue using protein shakes with high fiber content. - Encouraged regular bowel movements without straining. Insulin  resistance Prediabetes He has doing a good job reducing simple and added sugars in his diet and will continue to maintain a low-carb meal plan to stick calories.  We have avoided metformin because he had an unexplained metabolic acidosis in the past this has resolved.  He is interested in avoiding medications and continuing with lifestyle changes alone.  We will be checking disease monitoring labs fasting at the next office visit. Thoracic aortic aneurysm without rupture, unspecified part Diagnosed in August last year. Scheduled for a CT scan in early August to monitor the condition. Advised against strenuous activity but encouraged light physical activity for cardiovascular health. CT scan from August 25 showed Stable dilated aortic root measures approximately 4.5-4.6 cm at the level of the sinuses of Valsalva. The ascending thoracic aorta demonstrates stable dilatation measuring up to approximately 4.6 cm in greatest diameter. The proximal arch measures 3.4 cm and the distal arch 3.2 cm  - Blood pressure under reasonable control.  He does not smoke -He has stopped doing strenuous activity as a result. - Engage in light physical activity, avoiding strenuous exercises          Objective   Physical Exam:  Blood pressure 127/81, pulse 76, temperature 98.2 F (36.8 C), height 5' 11.5 (1.816 m), weight  267 lb (121.1 kg), SpO2 98%. Body mass index is 36.72 kg/m.  General: He is overweight, cooperative, alert, well developed, and in no acute distress. PSYCH: Has normal mood, affect and thought process.   HEENT: EOMI, sclerae are anicteric. Lungs: Normal breathing effort, no conversational dyspnea. Extremities: No edema.  Neurologic: No gross sensory or motor deficits. No tremors or fasciculations noted.    Diagnostic Data Reviewed:  BMET    Component Value Date/Time   NA 140 03/18/2024 1306   NA 138 03/06/2024 1423   K 4.5 03/18/2024 1306   CL 103 03/18/2024 1306   CO2 23 03/18/2024 1306   GLUCOSE 121 (H) 03/18/2024 1306   BUN 19 03/18/2024 1306   BUN 7 (L) 03/06/2024 1423   CREATININE 1.02 03/18/2024 1306   CALCIUM  9.7 03/18/2024 1306   GFRNONAA >60 03/18/2024 1306   Lab Results  Component Value Date   HGBA1C 5.8 (H) 02/21/2024   HGBA1C 5.5 02/19/2018   Lab Results  Component Value Date   INSULIN  37.9 (H) 02/21/2024   Lab Results  Component Value Date   TSH 2.500 02/21/2024   CBC    Component Value Date/Time   WBC 14.5 (H) 03/18/2024 1306   RBC 5.12 03/18/2024 1306   HGB 15.7 03/18/2024 1306   HGB 15.8 02/21/2024 1036   HCT 46.3 03/18/2024 1306   HCT 47.3 02/21/2024 1036   PLT PLATELET CLUMPS NOTED ON SMEAR, UNABLE TO ESTIMATE 03/18/2024 1306   PLT  245 02/21/2024 1036   MCV 90.4 03/18/2024 1306   MCV 93 02/21/2024 1036   MCH 30.7 03/18/2024 1306   MCHC 33.9 03/18/2024 1306   RDW 12.8 03/18/2024 1306   RDW 12.5 02/21/2024 1036   Iron Studies No results found for: IRON, TIBC, FERRITIN, IRONPCTSAT Lipid Panel     Component Value Date/Time   CHOL 78 (L) 02/21/2024 1036   TRIG 134 02/21/2024 1036   HDL 34 (L) 02/21/2024 1036   CHOLHDL 2.3 05/09/2023 1007   CHOLHDL 3 09/13/2022 0952   VLDL 27.0 09/13/2022 0952   LDLCALC 21 02/21/2024 1036   Hepatic Function Panel     Component Value Date/Time   PROT 7.3 03/18/2024 1306   PROT 6.7  03/06/2024 1423   ALBUMIN 4.7 03/18/2024 1306   ALBUMIN 4.5 03/06/2024 1423   AST 41 03/18/2024 1306   ALT 40 03/18/2024 1306   ALKPHOS 86 03/18/2024 1306   BILITOT 0.7 03/18/2024 1306   BILITOT 0.5 03/06/2024 1423   BILIDIR 0.19 05/09/2023 1007      Component Value Date/Time   TSH 2.500 02/21/2024 1036   Nutritional Lab Results  Component Value Date   VD25OH 17.7 (L) 02/21/2024    Medications: Outpatient Encounter Medications as of 08/15/2024  Medication Sig   levocetirizine (XYZAL ) 5 MG tablet TAKE 1 TABLET BY MOUTH EVERY DAY IN THE EVENING   levothyroxine  (SYNTHROID ) 137 MCG tablet TAKE 1 TABLET BY MOUTH DAILY BEFORE BREAKFAST.   losartan  (COZAAR ) 100 MG tablet Take 1 tablet (100 mg total) by mouth daily.   polyethylene glycol (MIRALAX / GLYCOLAX) 17 g packet Take 17 g by mouth daily.   rosuvastatin  (CRESTOR ) 40 MG tablet Take 1 tablet (40 mg total) by mouth daily.   [DISCONTINUED] Vitamin D , Ergocalciferol , (DRISDOL ) 1.25 MG (50000 UNIT) CAPS capsule Take 1 capsule (50,000 Units total) by mouth every 7 (seven) days.   No facility-administered encounter medications on file as of 08/15/2024.     Follow-Up   Return in about 4 weeks (around 09/12/2024) for For Weight Mangement with Dr. Francyne, Fasting for labs.. He was informed of the importance of frequent follow up visits to maximize his success with intensive lifestyle modifications for his multiple health conditions.  Attestation Statement   Reviewed by clinician on day of visit: allergies, medications, problem list, medical history, surgical history, family history, social history, and previous encounter notes.     Lucas Francyne, MD  "

## 2024-08-15 NOTE — Assessment & Plan Note (Signed)
 Diagnosed in August last year. Scheduled for a CT scan in early August to monitor the condition. Advised against strenuous activity but encouraged light physical activity for cardiovascular health. CT scan from August 25 showed Stable dilated aortic root measures approximately 4.5-4.6 cm at the level of the sinuses of Valsalva. The ascending thoracic aorta demonstrates stable dilatation measuring up to approximately 4.6 cm in greatest diameter. The proximal arch measures 3.4 cm and the distal arch 3.2 cm  - Blood pressure under reasonable control.  He does not smoke -He has stopped doing strenuous activity as a result. - Engage in light physical activity, avoiding strenuous exercises

## 2024-08-19 ENCOUNTER — Other Ambulatory Visit: Payer: Self-pay | Admitting: Medical

## 2024-09-07 ENCOUNTER — Other Ambulatory Visit (HOSPITAL_COMMUNITY): Payer: Self-pay

## 2024-09-12 ENCOUNTER — Ambulatory Visit (INDEPENDENT_AMBULATORY_CARE_PROVIDER_SITE_OTHER): Admitting: Internal Medicine

## 2024-09-20 ENCOUNTER — Ambulatory Visit (HOSPITAL_BASED_OUTPATIENT_CLINIC_OR_DEPARTMENT_OTHER)

## 2024-10-09 ENCOUNTER — Ambulatory Visit (INDEPENDENT_AMBULATORY_CARE_PROVIDER_SITE_OTHER): Admitting: Internal Medicine

## 2024-10-10 ENCOUNTER — Ambulatory Visit (INDEPENDENT_AMBULATORY_CARE_PROVIDER_SITE_OTHER): Admitting: Internal Medicine
